# Patient Record
Sex: Female | Born: 1960 | Race: White | Hispanic: No | Marital: Married | Smoking: Never smoker
Health system: Southern US, Community
[De-identification: ages and names within clinical notes are randomized; demographics above are authoritative.]

## PROBLEM LIST (undated history)

## (undated) DIAGNOSIS — T7840XA Allergy, unspecified, initial encounter: Secondary | ICD-10-CM

## (undated) DIAGNOSIS — E785 Hyperlipidemia, unspecified: Secondary | ICD-10-CM

## (undated) DIAGNOSIS — C4491 Basal cell carcinoma of skin, unspecified: Secondary | ICD-10-CM

## (undated) HISTORY — DX: Hyperlipidemia, unspecified: E78.5

## (undated) HISTORY — DX: Allergy, unspecified, initial encounter: T78.40XA

## (undated) HISTORY — DX: Basal cell carcinoma of skin, unspecified: C44.91

## (undated) HISTORY — PX: EYE SURGERY: SHX253

## (undated) HISTORY — PX: REFRACTIVE SURGERY: SHX103

## (undated) HISTORY — PX: CYST REMOVAL NECK: SHX6281

---

## 1999-12-11 HISTORY — PX: SKIN CANCER EXCISION: SHX779

## 2012-01-17 LAB — CBC AND DIFFERENTIAL
HCT: 39 % (ref 36–46)
Hemoglobin: 13.6 g/dL (ref 12.0–16.0)

## 2012-01-17 LAB — HEPATIC FUNCTION PANEL
Alkaline Phosphatase: 96 U/L (ref 25–125)
Bilirubin, Total: 0.5 mg/dL

## 2012-01-17 LAB — BASIC METABOLIC PANEL: Glucose: 101 mg/dL

## 2012-01-21 ENCOUNTER — Ambulatory Visit: Payer: Self-pay | Admitting: Internal Medicine

## 2012-01-23 ENCOUNTER — Ambulatory Visit: Payer: Self-pay | Admitting: Oncology

## 2012-01-24 ENCOUNTER — Ambulatory Visit: Payer: Self-pay | Admitting: Oncology

## 2012-02-08 ENCOUNTER — Ambulatory Visit: Payer: Self-pay | Admitting: Oncology

## 2012-09-15 IMAGING — CR METASTATIC BONE SURVEY
1 series · 8 of 8 positions shown · non-contrast
Comparison: none

REASON FOR EXAM: musculoskeletal system
COMMENTS:

PROCEDURE:     DXR - DXR BONE SURVEY METASTATIC  - January 29, 2012  [DATE]
RESULT:
Skeletal survey shows no lytic or blastic lesions suspicious for metastatic
disease.

[Series 1: w chest pa · 0.14mm/px · 8 of 20 slices shown]
[im 1/20]
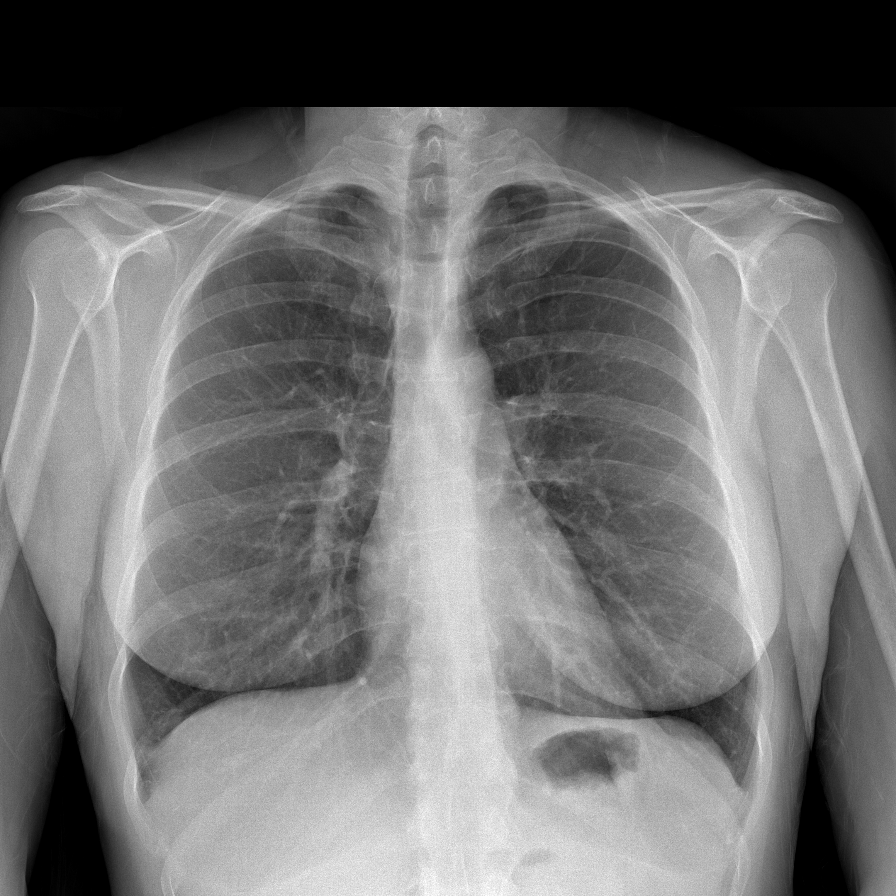
[im 3/20]
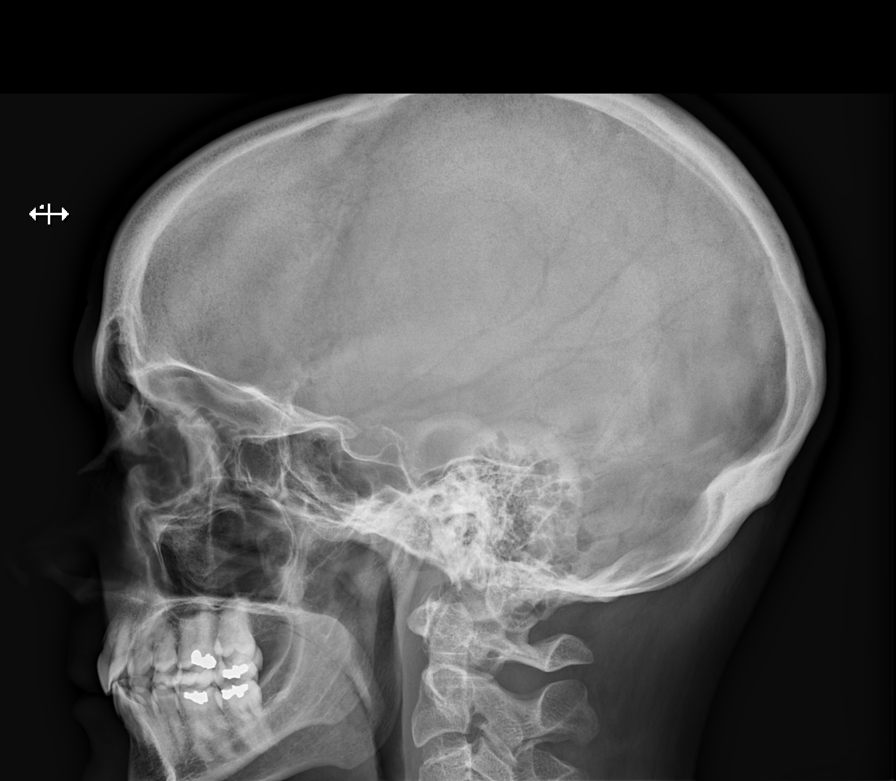
[im 6/20]
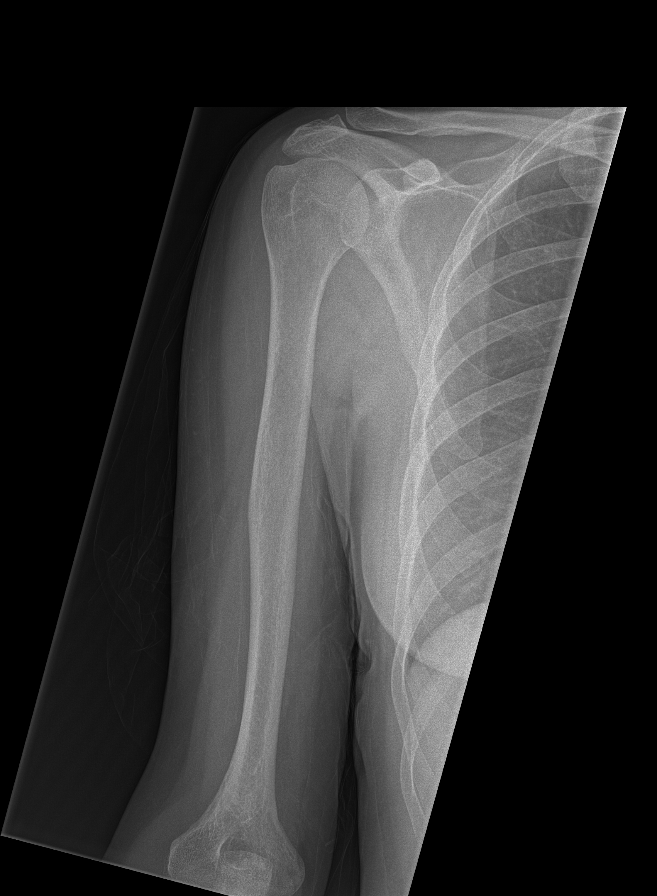
[im 9/20]
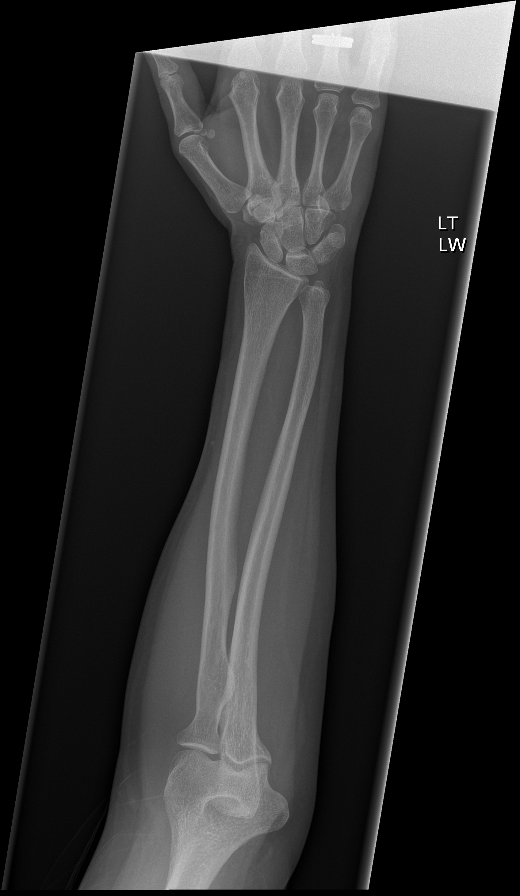
[im 11/20]
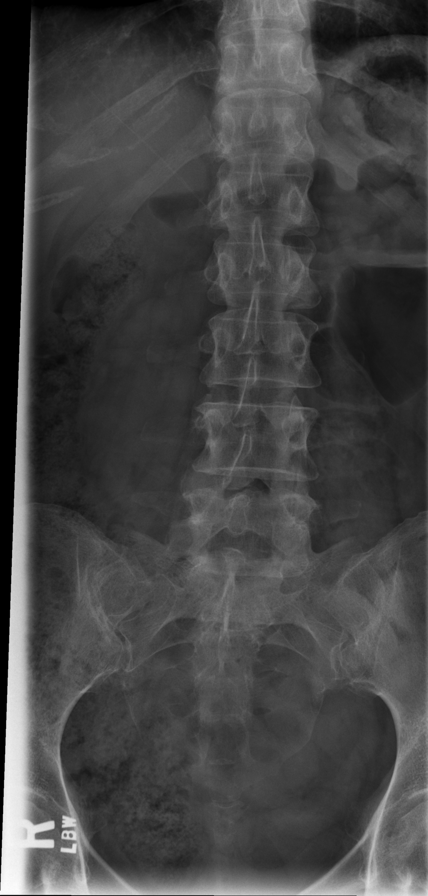
[im 14/20]
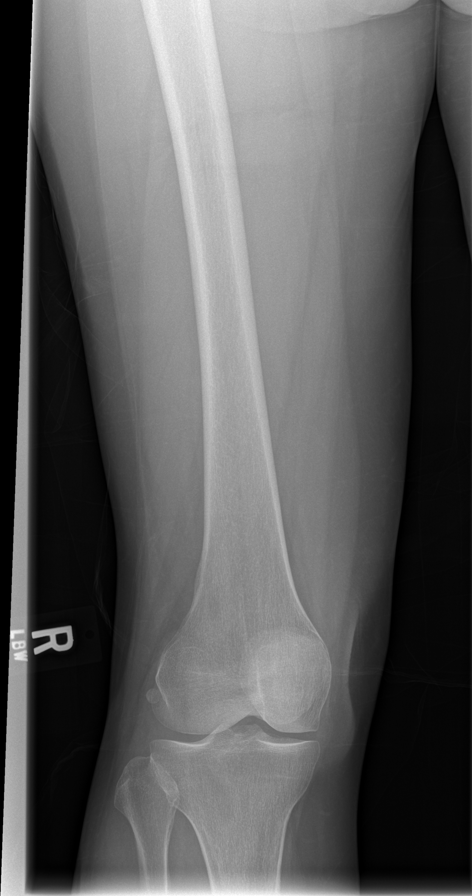
[im 17/20]
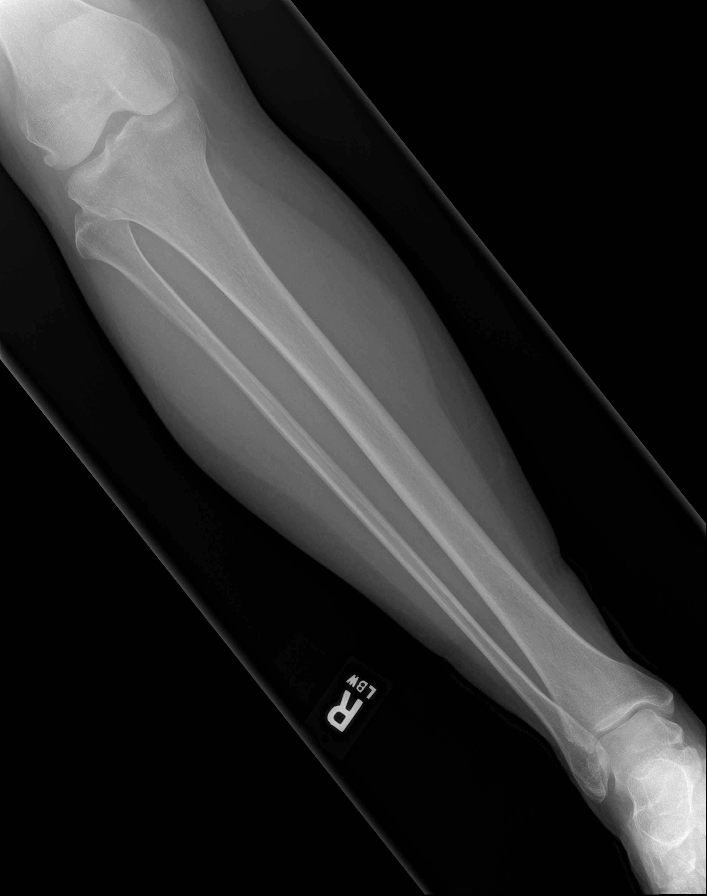
[im 20/20]
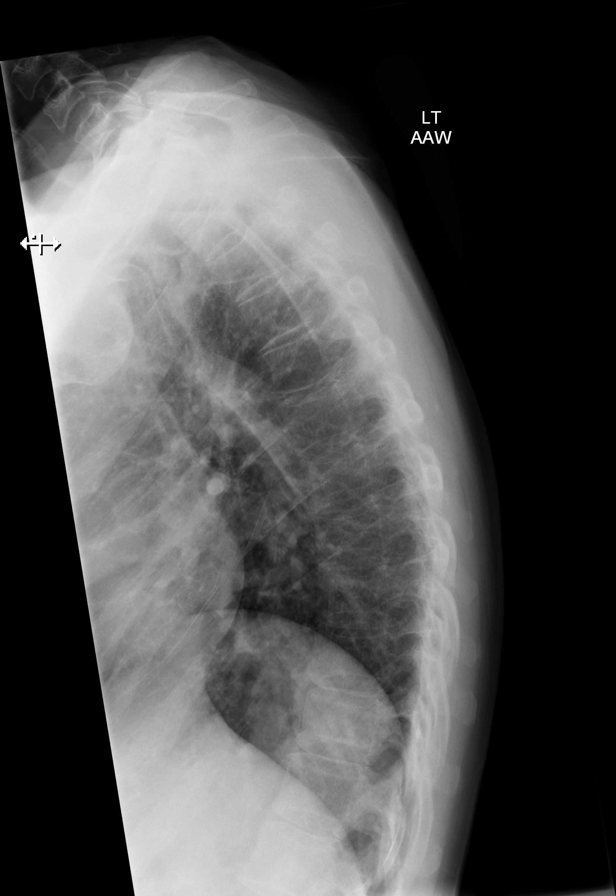

[8 of 8 positions shown; findings below may reference images not displayed]

IMPRESSION: No findings suspicious for metastatic disease are identified.

## 2012-09-29 ENCOUNTER — Ambulatory Visit: Payer: Self-pay | Admitting: Gastroenterology

## 2013-01-01 ENCOUNTER — Encounter: Payer: Self-pay | Admitting: *Deleted

## 2013-01-02 ENCOUNTER — Ambulatory Visit (INDEPENDENT_AMBULATORY_CARE_PROVIDER_SITE_OTHER): Payer: Federal, State, Local not specified - PPO | Admitting: Internal Medicine

## 2013-01-02 ENCOUNTER — Encounter: Payer: Self-pay | Admitting: Internal Medicine

## 2013-01-02 VITALS — BP 140/82 | HR 92 | Temp 98.3°F | Ht 66.0 in | Wt 145.8 lb

## 2013-01-02 DIAGNOSIS — R5381 Other malaise: Secondary | ICD-10-CM

## 2013-01-02 DIAGNOSIS — R5383 Other fatigue: Secondary | ICD-10-CM

## 2013-01-02 DIAGNOSIS — Z800 Family history of malignant neoplasm of digestive organs: Secondary | ICD-10-CM

## 2013-01-02 DIAGNOSIS — N3010 Interstitial cystitis (chronic) without hematuria: Secondary | ICD-10-CM

## 2013-01-02 DIAGNOSIS — C4490 Unspecified malignant neoplasm of skin, unspecified: Secondary | ICD-10-CM

## 2013-01-02 DIAGNOSIS — E7800 Pure hypercholesterolemia, unspecified: Secondary | ICD-10-CM

## 2013-01-02 MED ORDER — ALPRAZOLAM 0.25 MG PO TABS: 0.2500 mg | ORAL_TABLET | Freq: Every day | ORAL | Status: DC | PRN

## 2013-01-04 ENCOUNTER — Encounter: Payer: Self-pay | Admitting: Internal Medicine

## 2013-01-04 DIAGNOSIS — Z800 Family history of malignant neoplasm of digestive organs: Secondary | ICD-10-CM | POA: Insufficient documentation

## 2013-01-04 DIAGNOSIS — C4490 Unspecified malignant neoplasm of skin, unspecified: Secondary | ICD-10-CM | POA: Insufficient documentation

## 2013-01-04 DIAGNOSIS — E7800 Pure hypercholesterolemia, unspecified: Secondary | ICD-10-CM | POA: Insufficient documentation

## 2013-01-04 DIAGNOSIS — N3010 Interstitial cystitis (chronic) without hematuria: Secondary | ICD-10-CM | POA: Insufficient documentation

## 2013-01-04 NOTE — Assessment & Plan Note (Signed)
Low cholesterol diet and exercise.  Check lipid panel.   

## 2013-01-04 NOTE — Assessment & Plan Note (Signed)
Being followed at [facility].  Stable.  Follow.  Takes AZO prn.

## 2013-01-04 NOTE — Assessment & Plan Note (Signed)
Has a history of basal cell carcinoma.  Followed by Dr Dasher.  Sees him twice a year.   

## 2013-01-04 NOTE — Progress Notes (Signed)
  Subjective:    Patient ID: Kelly Tanner, female    DOB: 1961/01/24, 52 y.o.   MRN: [id number]  HPI 52 year old female with past history of hypercholesterolemia and basal cell carcinoma (skin) who comes in today for a scheduled follow up.  She states she is doing relatively well.  Just had her follow up colonoscopy 10/13.  States everything checked out fine.  Has been seen at [facility].  Diagnosed with interstitial cystitis.  Just uses AZO intermittently.  Averages needing 1x every 1-2 months.  Stress seems to trigger.  Handling stress relatively well.  Uses an occasional xanax.  Sees Dr Adolphus Birchwood 2x/year for follow up regarding the basal cell.  Stays active.  Has not been walking as much.  No cardiac symptoms with increased activity or exertion.  Bowels stable.  No vaginal problems.  Due to see Dr Luella Cook next week.   Past Medical History  Diagnosis Date  . Hyperlipidemia   . Basal cell carcinoma     Outpatient Encounter Prescriptions as of 01/02/2013  Medication Sig Dispense Refill  . ALPRAZolam (XANAX) 0.25 MG tablet Take 1 tablet (0.25 mg total) by mouth daily as needed.  30 tablet  0  . [DISCONTINUED] ALPRAZolam (XANAX) 0.25 MG tablet Take 0.25 mg by mouth 2 (two) times daily as needed.        Review of Systems Patient denies any headache, lightheadedness or dizziness.  No sinus or allergy symptoms.  No chest pain, tightness or palpitations.  No increased shortness of breath, cough or congestion.  No nausea or vomiting. No acid reflux.   No abdominal pain or cramping.  No bowel change, such as diarrhea, constipation, BRBPR or melana.  No urine change.   No vaginal problems.      Objective:   Physical Exam  Filed Vitals:   01/02/13 1431  BP: 140/82  Pulse: 92  Temp: 98.3 F (36.8 C)   Blood pressure recheck:  14/54  52 year old female in no acute distress.   HEENT:  Nares- clear.  Oropharynx - without lesions. NECK:  Supple.  Nontender.  No audible bruit.  HEART:  Appears to be  regular. LUNGS:  No crackles or wheezing audible.  Respirations even and unlabored.  RADIAL PULSE:  Equal bilaterally.   ABDOMEN:  Soft, nontender.  Bowel sounds present and normal.  No audible abdominal bruit.    EXTREMITIES:  No increased edema present.  DP pulses palpable and equal bilaterally.           Assessment & Plan:  INCREASED STRESS.  Handling things relatively well.  Uses an occasional xanax.  Follow.    GYN.  Has been followed at [facility].  Was due to see Dr Luella Cook next week.  Wants to get her physicals here.  Will schedule for next visit.    FATIGUE.  Did report some fatigue.  Will check cbc, met c and tsh.   ELEVATED BLOOD PRESSURE WITHOUT DIAGNAOSIS OF HYPERTENSION.  Blood pressures on outside checks averaging 120-130/70s.  Recheck today 130/76.  Follow.  On no medication.   HEALTH MAINTENANCE.  Schedule a physical next visit.  Mammogram 6/13 (per her report - ok).  Obtain results.  Colonoscopy 10/13 - ok.  Obtain results.  Check cholesterol.

## 2013-01-04 NOTE — Assessment & Plan Note (Signed)
Colonoscopy 10/13.  Obtain results.  Follow up 5 years.

## 2013-01-24 ENCOUNTER — Other Ambulatory Visit: Payer: Self-pay

## 2013-01-31 LAB — HM COLONOSCOPY

## 2013-02-10 ENCOUNTER — Other Ambulatory Visit: Payer: Federal, State, Local not specified - PPO

## 2013-02-17 ENCOUNTER — Encounter: Payer: Federal, State, Local not specified - PPO | Admitting: Internal Medicine

## 2013-03-10 ENCOUNTER — Encounter: Payer: Self-pay | Admitting: Internal Medicine

## 2013-03-25 ENCOUNTER — Other Ambulatory Visit (INDEPENDENT_AMBULATORY_CARE_PROVIDER_SITE_OTHER): Payer: Federal, State, Local not specified - PPO

## 2013-03-25 DIAGNOSIS — R5381 Other malaise: Secondary | ICD-10-CM

## 2013-03-25 DIAGNOSIS — E7800 Pure hypercholesterolemia, unspecified: Secondary | ICD-10-CM

## 2013-03-25 DIAGNOSIS — R5383 Other fatigue: Secondary | ICD-10-CM

## 2013-03-25 LAB — COMPREHENSIVE METABOLIC PANEL
ALT: 16 U/L (ref 0–35)
AST: 16 U/L (ref 0–37)
Albumin: 4.3 g/dL (ref 3.5–5.2)
Alkaline Phosphatase: 96 U/L (ref 39–117)
BUN: 10 mg/dL (ref 6–23)
Potassium: 4.6 mEq/L (ref 3.5–5.1)

## 2013-03-25 LAB — CBC WITH DIFFERENTIAL/PLATELET
Basophils Absolute: 0 10*3/uL (ref 0.0–0.1)
Basophils Relative: 0.3 % (ref 0.0–3.0)
Eosinophils Absolute: 0.1 10*3/uL (ref 0.0–0.7)
Lymphocytes Relative: 21.8 % (ref 12.0–46.0)
MCHC: 33.4 g/dL (ref 30.0–36.0)
MCV: 89.2 fl (ref 78.0–100.0)
Monocytes Absolute: 0.4 10*3/uL (ref 0.1–1.0)
Neutrophils Relative %: 71 % (ref 43.0–77.0)
Platelets: 244 10*3/uL (ref 150.0–400.0)
RBC: 3.98 Mil/uL (ref 3.87–5.11)
RDW: 13.5 % (ref 11.5–14.6)

## 2013-03-25 LAB — LIPID PANEL
Cholesterol: 199 mg/dL (ref 0–200)
LDL Cholesterol: 130 mg/dL — ABNORMAL HIGH (ref 0–99)

## 2013-03-26 ENCOUNTER — Telehealth: Payer: Self-pay | Admitting: Internal Medicine

## 2013-03-26 NOTE — Telephone Encounter (Signed)
Can you see if we can add ferritin and ibc panel (dx 285.9) to blood drawn yesterday.  Thanks.  Let me know if unable.

## 2013-03-27 ENCOUNTER — Encounter: Payer: Self-pay | Admitting: Internal Medicine

## 2013-03-30 ENCOUNTER — Encounter: Payer: Self-pay | Admitting: *Deleted

## 2013-03-30 ENCOUNTER — Other Ambulatory Visit (INDEPENDENT_AMBULATORY_CARE_PROVIDER_SITE_OTHER): Payer: Federal, State, Local not specified - PPO

## 2013-03-30 DIAGNOSIS — D649 Anemia, unspecified: Secondary | ICD-10-CM

## 2013-03-31 ENCOUNTER — Telehealth: Payer: Self-pay | Admitting: *Deleted

## 2013-03-31 LAB — IBC PANEL
Iron: 41 ug/dL — ABNORMAL LOW (ref 42–145)
Transferrin: 239.3 mg/dL (ref 212.0–360.0)

## 2013-03-31 NOTE — Telephone Encounter (Signed)
I don't know.  I have lab results on this pt from 03/25/13.  When were these tubes drawn.  She is coming in 04/01/13 for an appt and I was going to follow up on lab check then.

## 2013-03-31 NOTE — Telephone Encounter (Signed)
I would hold until tomorrow.  I will talk to pt tomorrow.  Thanks.

## 2013-03-31 NOTE — Telephone Encounter (Signed)
Her tubes are here and there are no orders or a labels on the tube, what would you like to do with them

## 2013-03-31 NOTE — Telephone Encounter (Signed)
Ok thank you 

## 2013-03-31 NOTE — Telephone Encounter (Signed)
I do not know when they were drawn? I just seen tubes with the the patients name on it but no date?

## 2013-03-31 NOTE — Telephone Encounter (Signed)
Duplicate

## 2013-03-31 NOTE — Telephone Encounter (Signed)
See other message

## 2013-03-31 NOTE — Telephone Encounter (Signed)
i have tubes from this pt, I just don't have labels on them or orders?

## 2013-04-01 ENCOUNTER — Ambulatory Visit (INDEPENDENT_AMBULATORY_CARE_PROVIDER_SITE_OTHER): Payer: Federal, State, Local not specified - PPO | Admitting: Internal Medicine

## 2013-04-01 ENCOUNTER — Encounter: Payer: Self-pay | Admitting: Internal Medicine

## 2013-04-01 VITALS — BP 140/80 | HR 106 | Temp 99.1°F | Ht 66.25 in | Wt 146.0 lb

## 2013-04-01 DIAGNOSIS — C4490 Unspecified malignant neoplasm of skin, unspecified: Secondary | ICD-10-CM

## 2013-04-01 DIAGNOSIS — Z800 Family history of malignant neoplasm of digestive organs: Secondary | ICD-10-CM

## 2013-04-01 DIAGNOSIS — D649 Anemia, unspecified: Secondary | ICD-10-CM

## 2013-04-01 DIAGNOSIS — E7800 Pure hypercholesterolemia, unspecified: Secondary | ICD-10-CM

## 2013-04-01 DIAGNOSIS — Z1239 Encounter for other screening for malignant neoplasm of breast: Secondary | ICD-10-CM

## 2013-04-01 DIAGNOSIS — N3010 Interstitial cystitis (chronic) without hematuria: Secondary | ICD-10-CM

## 2013-04-01 DIAGNOSIS — N390 Urinary tract infection, site not specified: Secondary | ICD-10-CM

## 2013-04-01 LAB — POCT URINALYSIS DIPSTICK
Protein, UA: NEGATIVE
Spec Grav, UA: 1.01
Urobilinogen, UA: 0.2

## 2013-04-01 MED ORDER — ALPRAZOLAM 0.25 MG PO TABS: 0.2500 mg | ORAL_TABLET | Freq: Every day | ORAL | Status: DC | PRN

## 2013-04-01 NOTE — Progress Notes (Signed)
Ok

## 2013-04-02 ENCOUNTER — Encounter: Payer: Self-pay | Admitting: Internal Medicine

## 2013-04-03 LAB — URINE CULTURE: Colony Count: 70000

## 2013-04-03 MED ORDER — AMOXICILLIN 875 MG PO TABS: 875.0000 mg | ORAL_TABLET | Freq: Two times a day (BID) | ORAL | Status: AC

## 2013-04-05 ENCOUNTER — Encounter: Payer: Self-pay | Admitting: Internal Medicine

## 2013-04-05 NOTE — Assessment & Plan Note (Signed)
Colonoscopy 10/13.  Obtain results.  Follow up 5 years.

## 2013-04-05 NOTE — Assessment & Plan Note (Signed)
Has a history of basal cell carcinoma.  Followed by Dr Dasher.  Sees him twice a year.   

## 2013-04-05 NOTE — Assessment & Plan Note (Signed)
Low cholesterol diet and exercise.  Follow lipid panel.   

## 2013-04-05 NOTE — Assessment & Plan Note (Signed)
Being followed at [facility].  Stable.  Follow.  Takes AZO prn.

## 2013-04-05 NOTE — Progress Notes (Signed)
Subjective:    Patient ID: Kelly Tanner, female    DOB: [birth year], 52 y.o.   MRN: [id number]  Urinary Tract Infection  Associated symptoms include frequency.  Urinary Frequency  Associated symptoms include frequency.   52 year old female with past history of hypercholesterolemia and basal cell carcinoma (skin) who comes in today to follow up on these issues as well as for a complete physical exam.  She states she is doing relatively well.  Just had her follow up colonoscopy 10/13.  States everything checked out fine. Has been seen at [facility].  Diagnosed with interstitial cystitis.  Just uses AZO intermittently.  Did notice yesterday am - discomfort with urination.  Some pressure.  Took AZO yesterday.   Handling stress relatively well.  Uses an occasional xanax.  Sees Dr Adolphus Birchwood 2x/year for follow up regarding the basal cell.  Stays active.  Has not been walking as much.  No cardiac symptoms with increased activity or exertion.  Bowels stable.  No vaginal problems.  Did see Dr Luella Cook for her pelvic and pap.    Past Medical History  Diagnosis Date  . Hyperlipidemia   . Basal cell carcinoma     Outpatient Encounter Prescriptions as of 04/01/2013  Medication Sig Dispense Refill  . ALPRAZolam (XANAX) 0.25 MG tablet Take 1 tablet (0.25 mg total) by mouth daily as needed.  30 tablet  0  . diphenhydrAMINE (BENADRYL) 25 MG tablet Take 25 mg by mouth as needed for itching.      . Ibuprofen-Diphenhydramine Cit (ADVIL PM PO) Take by mouth as needed.      . [DISCONTINUED] ALPRAZolam (XANAX) 0.25 MG tablet Take 1 tablet (0.25 mg total) by mouth daily as needed.  30 tablet  0  . [DISCONTINUED] amoxicillin (AMOXIL) 875 MG tablet Take 875 mg by mouth 2 (two) times daily.      Marland Kitchen amoxicillin (AMOXIL) 875 MG tablet Take 1 tablet (875 mg total) by mouth 2 (two) times daily.  14 tablet  0   No facility-administered encounter medications on file as of 04/01/2013.    Review of Systems  Genitourinary:  Positive for frequency.  Patient denies any headache, lightheadedness or dizziness.  No sinus or allergy symptoms.  No chest pain, tightness or palpitations.  No increased shortness of breath, cough or congestion.  No nausea or vomiting. No acid reflux.   No abdominal pain or cramping.  No bowel change, such as diarrhea, constipation, BRBPR or melana.  Urinary symptoms as outlined.  No vaginal problems.      Objective:   Physical Exam  Filed Vitals:   04/01/13 0850  BP: 140/80  Pulse: 106  Temp: 99.1 F (37.3 C)   Blood pressure recheck:  63/73  52 year old female in no acute distress.   HEENT:  Nares- clear.  Oropharynx - without lesions. NECK:  Supple.  Nontender.  No audible bruit.  HEART:  Appears to be regular. LUNGS:  No crackles or wheezing audible.  Respirations even and unlabored.  RADIAL PULSE:  Equal bilaterally.    BREASTS:  No nipple discharge or nipple retraction present.  Could not appreciate any distinct nodules or axillary adenopathy.  ABDOMEN:  Soft, nontender.  Bowel sounds present and normal.  No audible abdominal bruit.  GU:  Performed through gyn.   EXTREMITIES:  No increased edema present.  DP pulses palpable and equal bilaterally.           Assessment & Plan:  INCREASED STRESS.  Handling  things relatively well.  Uses an occasional xanax.  Follow.    GYN.  Pelvic and pap -  Dr Luella Cook.  States everything checked out fine.  Obtain records.   ELEVATED BLOOD PRESSURE WITHOUT DIAGNAOSIS OF HYPERTENSION.  Blood pressure doing better.  Follow.   DYSURIA.  Urinalysis with blood.  Send for culture.  Hold on treatment until cx results return.   HEALTH MAINTENANCE.  Physical today.  Had her pelvic and pap through Dr Luella Cook.  Obtain records.   Mammogram 6/13 (per her report - ok).  Obtain results.  Colonoscopy 09/29/12 - ok.  Obtain results.

## 2013-04-07 ENCOUNTER — Encounter: Payer: Self-pay | Admitting: Internal Medicine

## 2013-04-10 ENCOUNTER — Telehealth: Payer: Self-pay | Admitting: Internal Medicine

## 2013-04-10 NOTE — Telephone Encounter (Signed)
Called pt and discussed culture results.  Questions answered.

## 2013-04-17 ENCOUNTER — Ambulatory Visit (INDEPENDENT_AMBULATORY_CARE_PROVIDER_SITE_OTHER): Payer: Federal, State, Local not specified - PPO | Admitting: Internal Medicine

## 2013-04-17 ENCOUNTER — Telehealth: Payer: Self-pay | Admitting: Internal Medicine

## 2013-04-17 ENCOUNTER — Encounter: Payer: Self-pay | Admitting: Internal Medicine

## 2013-04-17 VITALS — BP 130/80 | HR 125 | Temp 99.2°F | Ht 66.25 in | Wt 147.2 lb

## 2013-04-17 DIAGNOSIS — K146 Glossodynia: Secondary | ICD-10-CM

## 2013-04-17 NOTE — Telephone Encounter (Signed)
Pt coming in today at 3:15 to be seen

## 2013-04-17 NOTE — Progress Notes (Signed)
  Subjective:    Patient ID: Kelly Tanner, female    DOB: 1961/11/24, 52 y.o.   MRN: [id number]  HPI 52 year old female with past history of hypercholesterolemia and basal cell carcinoma (skin) who comes in today as a work in with concerns regarding a burning sensation in her tongue.  She states it started several days ago.  Describes burning on the side of her tongue.  Eating does not bother her.  Better in the morning and worse as the day progresses.  She just completed a course of abx.  Taking a multivitamin with iron and vitamin E.  No fever.  No sore throat.  No sinus congestion or drainage.     Past Medical History  Diagnosis Date  . Hyperlipidemia   . Basal cell carcinoma     Outpatient Encounter Prescriptions as of 04/17/2013  Medication Sig Dispense Refill  . ALPRAZolam (XANAX) 0.25 MG tablet Take 1 tablet (0.25 mg total) by mouth daily as needed.  30 tablet  0  . beta carotene w/minerals (OCUVITE) tablet Take 1 tablet by mouth daily.      . diphenhydrAMINE (BENADRYL) 25 MG tablet Take 25 mg by mouth as needed for itching.      . Ibuprofen-Diphenhydramine Cit (ADVIL PM PO) Take by mouth as needed.      . vitamin E 200 UNIT capsule Take 400 Units by mouth daily.       No facility-administered encounter medications on file as of 04/17/2013.    Review of Systems Patient denies any headache, lightheadedness or dizziness.  No sinus or allergy symptoms.   No increased shortness of breath, cough or congestion.  No lip or tongue swelling.  No nausea or vomiting. No acid reflux.        Objective:   Physical Exam  Filed Vitals:   04/17/13 1508  BP: 130/80  Pulse: 125  Temp: 99.2 F (37.3 C)   Pulse improved on recheck 52 year old female in no acute distress.   HEENT:  Nares- clear.  Oropharynx - minimal erythema - outer tongue.  No white plaques.  NECK:  Supple.  Nontender.   HEART:  Appears to be regular. LUNGS:  No crackles or wheezing audible.  Respirations even and  unlabored.          Assessment & Plan:  BURNING TONGUE.  Question if related to recent abx usage.  Will have her try Dukes Magic Mouthwash swish and spit tid.  Call me if persistent symptoms.    INCREASED STRESS.  Handling things relatively well.  Uses an occasional xanax.  Follow.     HEALTH MAINTENANCE.  Scheduled a physical next visit.  Mammogram 6/13 (per her report - ok).  Obtain results.  Colonoscopy 10/13 - ok.

## 2013-04-17 NOTE — Telephone Encounter (Signed)
See if she can come in at 3:15 for this problem.  Can evaluate and see what is needed.

## 2013-04-17 NOTE — Telephone Encounter (Signed)
Patient Information:  Caller Name: Lateia  Phone: [phone]  Patient: Kelly Tanner, Kelly Tanner  Gender: Female  DOB: 19-Dec-1960  Age: 52 Years  PCP: Dale McCrory  Pregnant: No  Office Follow Up:  Does the office need to follow up with this patient?: Yes  Instructions For The Office: Please contact patient for mouth burning. Reviewed Health information on Mouth, Amoxicillen and FE. Unsure of cause  RN Note:  Reviewed information side effects on Amoxicillen and Fe products.  Please contact patient for s/sx of "Tongue burning".  Symptoms  Reason For Call & Symptoms: Patient states she is having symptoms-this week.  Her tongue feels like its on fire.  She started a multivitamin Fe and vitamin E since 04/02/13 . She was on Antibotic amoxicillen and finished on 04/10/13.  Tongue is not swollen, not painful to eat. No sores or lesions. mild redness. Denies ulcers or sores. No edema. voice is clear and no problems breathing.  She is also having tingling in hands a feet on./off due to her Anxiety.  Reviewed Health History In EMR: Yes  Reviewed Medications In EMR: Yes  Reviewed Allergies In EMR: Yes  Reviewed Surgeries / Procedures: Yes  Date of Onset of Symptoms: 04/13/2013 OB / GYN:  LMP: Unknown  Guideline(s) Used:  No Protocol Available - Sick Adult  Disposition Per Guideline:   Discuss with PCP and Callback by Nurse Today  Reason For Disposition Reached:   Nursing judgment  Advice Given:  Call Back If:  New symptoms develop  You become worse.  RN Overrode Recommendation:  Follow Up With Office Later  Please contact patient for mouth burning. Reviewed Health information on Mouth, Amoxicillen and FE. Unsure of cause

## 2013-04-22 ENCOUNTER — Ambulatory Visit (INDEPENDENT_AMBULATORY_CARE_PROVIDER_SITE_OTHER): Payer: Federal, State, Local not specified - PPO | Admitting: Internal Medicine

## 2013-04-22 ENCOUNTER — Encounter: Payer: Self-pay | Admitting: Internal Medicine

## 2013-04-22 ENCOUNTER — Telehealth: Payer: Self-pay | Admitting: Internal Medicine

## 2013-04-22 VITALS — BP 126/84 | HR 114 | Temp 99.5°F | Wt 146.0 lb

## 2013-04-22 DIAGNOSIS — N3010 Interstitial cystitis (chronic) without hematuria: Secondary | ICD-10-CM

## 2013-04-22 DIAGNOSIS — R350 Frequency of micturition: Secondary | ICD-10-CM

## 2013-04-22 DIAGNOSIS — R319 Hematuria, unspecified: Secondary | ICD-10-CM

## 2013-04-22 LAB — POCT URINALYSIS DIPSTICK
Glucose, UA: 100
Nitrite, UA: POSITIVE
Urobilinogen, UA: 0.2

## 2013-04-22 MED ORDER — CIPROFLOXACIN HCL 500 MG PO TABS: 500.0000 mg | ORAL_TABLET | Freq: Two times a day (BID) | ORAL | Status: DC

## 2013-04-22 NOTE — Telephone Encounter (Signed)
Pt called wanting to see if she could get in today and she stated she has lots of blood in her urine, pain and pressure  Pt did take azo

## 2013-04-22 NOTE — Telephone Encounter (Signed)
Left detailed message on cell number asking pt to come in at 12:15 for 12:30 appointment.

## 2013-04-22 NOTE — Telephone Encounter (Signed)
Pt aware of appointment 

## 2013-04-22 NOTE — Telephone Encounter (Signed)
See if she can come in at 12:30 for work in for this problem.

## 2013-04-23 ENCOUNTER — Encounter: Payer: Self-pay | Admitting: Internal Medicine

## 2013-04-23 DIAGNOSIS — R319 Hematuria, unspecified: Secondary | ICD-10-CM | POA: Insufficient documentation

## 2013-04-23 NOTE — Assessment & Plan Note (Signed)
Evaluated at [facility].  Had cystoscopy and CT.  Obtain results.  Prefers to see Dr Achilles Dunk.  Will arrange appt.

## 2013-04-23 NOTE — Assessment & Plan Note (Signed)
Pt describes the passing of blood and a blood clot.  Exam as outlined.  Urine dip - c/w a possible uti.  Will treat with cipro bid as directed.  After discussion and given presentation, will refer her back to urology for evaluation.  She prefers to see Dr Achilles Dunk.  Stay hydrated.

## 2013-04-23 NOTE — Progress Notes (Signed)
Subjective:    Patient ID: Kelly Tanner, female    DOB: November 17, 1961, 52 y.o.   MRN: [id number]  Urinary Tract Infection   52 year old female with past history of hypercholesterolemia and basal cell carcinoma (skin) who comes in today as a work in with concerns regarding hematuria. Recently was treated for a urinary tract infection.  She had reported that her bladder felt the "best it has felt" in a long time - after completing the abx.  She stopped her diet drinks.  Has been staying hydrated.  Eating better.  Noticed some urinary urgency and burning this am.  When she wiped she noticed some  Pink on the tissue.  She took AZO.  The discomfort increased.  States it is a knife like sensation.  She then went to the bathroom and "blood came out".   No rectal or vaginal bleeding.  She passed a small clot.  No evidence of stone.  States she has done this one other time - which prompted a referral to urology.  She was evaluated and worked up by [facility].  Had negative CT and cystoscopy.  Was told she had interstitial cystitis.  Intermittently will have discomfort and flares.     Past Medical History  Diagnosis Date  . Hyperlipidemia   . Basal cell carcinoma     Outpatient Encounter Prescriptions as of 04/22/2013  Medication Sig Dispense Refill  . ALPRAZolam (XANAX) 0.25 MG tablet Take 1 tablet (0.25 mg total) by mouth daily as needed.  30 tablet  0  . beta carotene w/minerals (OCUVITE) tablet Take 1 tablet by mouth daily.      . diphenhydrAMINE (BENADRYL) 25 MG tablet Take 25 mg by mouth as needed for itching.      . Ibuprofen-Diphenhydramine Cit (ADVIL PM PO) Take by mouth as needed.      . ciprofloxacin (CIPRO) 500 MG tablet Take 1 tablet (500 mg total) by mouth 2 (two) times daily.  14 tablet  0  . vitamin E 200 UNIT capsule Take 400 Units by mouth daily.       No facility-administered encounter medications on file as of 04/22/2013.    Review of Systems Patient denies any fever.  The  previous "burning tongue" has improved.  No nausea or vomiting. No significant abdominal pain.  Reports the pressure sensation.  No back pain now.  Previously had sharp back pain.  Lasted for  Short period and then resolved.  No vaginal or rectal bleeding. Urinary symptoms as outlined.  Previously diagnosed with interstitial cystitis.  Had lots of questions regarding IC and the hematuria.  Pts husband accompanies her today.       Objective:   Physical Exam  Filed Vitals:   04/22/13 1222  BP: 126/84  Pulse: 114  Temp: 99.5 F (37.5 C)   Pulse recheck:  71  52 year old female in no acute distress.   HEART:  Appears to be regular. LUNGS:  No crackles or wheezing audible.  Respirations even and unlabored.  RADIAL PULSE:  Equal bilaterally.   ABDOMEN:  Soft, nontender.  Bowel sounds present and normal.  No audible abdominal bruit.  BACK:  No CVA tenderness. Non tender.            Assessment & Plan:  BURNING TONGUE. Improved.      INCREASED STRESS.  Handling things relatively well.  Uses an occasional xanax.  Follow.     HEALTH MAINTENANCE.  Scheduled for a physical next  visit.  Mammogram 6/13 (per her report - ok).  Obtain results.  Colonoscopy 10/13 - ok.   I spent over 25 minutes with the patient and her husband and more than 50% of the time was spent in consultation regarding the above.

## 2013-04-24 LAB — URINE CULTURE: Colony Count: 9000

## 2013-04-26 ENCOUNTER — Encounter: Payer: Self-pay | Admitting: Internal Medicine

## 2013-05-05 ENCOUNTER — Other Ambulatory Visit: Payer: Federal, State, Local not specified - PPO

## 2013-05-18 ENCOUNTER — Encounter: Payer: Self-pay | Admitting: Internal Medicine

## 2013-05-18 ENCOUNTER — Other Ambulatory Visit (INDEPENDENT_AMBULATORY_CARE_PROVIDER_SITE_OTHER): Payer: Federal, State, Local not specified - PPO

## 2013-05-18 DIAGNOSIS — D649 Anemia, unspecified: Secondary | ICD-10-CM

## 2013-05-18 LAB — CBC WITH DIFFERENTIAL/PLATELET
Basophils Absolute: 0 10*3/uL (ref 0.0–0.1)
Eosinophils Relative: 1.7 % (ref 0.0–5.0)
Hemoglobin: 12.5 g/dL (ref 12.0–15.0)
Lymphocytes Relative: 22 % (ref 12.0–46.0)
Monocytes Relative: 4.7 % (ref 3.0–12.0)
Neutro Abs: 4.7 10*3/uL (ref 1.4–7.7)
RDW: 13.9 % (ref 11.5–14.6)
WBC: 6.5 10*3/uL (ref 4.5–10.5)

## 2013-05-18 LAB — FERRITIN: Ferritin: 53.3 ng/mL (ref 10.0–291.0)

## 2013-05-19 ENCOUNTER — Encounter: Payer: Self-pay | Admitting: Internal Medicine

## 2013-05-25 ENCOUNTER — Other Ambulatory Visit: Payer: Federal, State, Local not specified - PPO

## 2013-05-25 DIAGNOSIS — R3129 Other microscopic hematuria: Secondary | ICD-10-CM | POA: Insufficient documentation

## 2013-05-27 ENCOUNTER — Other Ambulatory Visit: Payer: Federal, State, Local not specified - PPO

## 2013-06-02 ENCOUNTER — Encounter: Payer: Self-pay | Admitting: *Deleted

## 2013-06-06 ENCOUNTER — Encounter: Payer: Self-pay | Admitting: Internal Medicine

## 2013-06-25 ENCOUNTER — Encounter: Payer: Self-pay | Admitting: Internal Medicine

## 2013-10-06 ENCOUNTER — Encounter: Payer: Self-pay | Admitting: Internal Medicine

## 2013-10-06 ENCOUNTER — Ambulatory Visit (INDEPENDENT_AMBULATORY_CARE_PROVIDER_SITE_OTHER): Payer: Federal, State, Local not specified - PPO | Admitting: Internal Medicine

## 2013-10-06 VITALS — BP 142/82 | HR 86 | Temp 98.6°F | Ht 66.25 in | Wt 151.0 lb

## 2013-10-06 DIAGNOSIS — E7800 Pure hypercholesterolemia, unspecified: Secondary | ICD-10-CM

## 2013-10-06 DIAGNOSIS — Z23 Encounter for immunization: Secondary | ICD-10-CM

## 2013-10-06 DIAGNOSIS — R319 Hematuria, unspecified: Secondary | ICD-10-CM

## 2013-10-06 DIAGNOSIS — N3010 Interstitial cystitis (chronic) without hematuria: Secondary | ICD-10-CM

## 2013-10-06 DIAGNOSIS — Z800 Family history of malignant neoplasm of digestive organs: Secondary | ICD-10-CM

## 2013-10-06 DIAGNOSIS — C4490 Unspecified malignant neoplasm of skin, unspecified: Secondary | ICD-10-CM

## 2013-10-06 DIAGNOSIS — D649 Anemia, unspecified: Secondary | ICD-10-CM

## 2013-10-06 MED ORDER — ALPRAZOLAM 0.25 MG PO TABS: 0.2500 mg | ORAL_TABLET | Freq: Every day | ORAL | Status: DC | PRN

## 2013-10-11 ENCOUNTER — Encounter: Payer: Self-pay | Admitting: Internal Medicine

## 2013-10-11 DIAGNOSIS — D649 Anemia, unspecified: Secondary | ICD-10-CM | POA: Insufficient documentation

## 2013-10-11 NOTE — Assessment & Plan Note (Signed)
Colonoscopy 10/13.    Follow up 5 years.   

## 2013-10-11 NOTE — Assessment & Plan Note (Signed)
Evaluated at [facility].  Had cystoscopy and CT.  Recently evaluated by Dr Cope.  Is being followed by Dr Cope now.  Cystoscopy ok.  Takes nitrofurantoin prn.  Doing well. States does not have interstitial cystitis.    

## 2013-10-11 NOTE — Assessment & Plan Note (Signed)
Last check normal.  Follow cbc/ferritin.  Up to date with colonoscopy.   

## 2013-10-11 NOTE — Assessment & Plan Note (Signed)
Has a history of basal cell carcinoma.  Followed by Dr Dasher.  Sees him twice a year.   

## 2013-10-11 NOTE — Progress Notes (Signed)
  Subjective:    Patient ID: Kelly Tanner, female    DOB: 01-25-1961, 52 y.o.   MRN: [id number]  HPI 52 year old female with past history of hypercholesterolemia and basal cell carcinoma (skin) who comes in today for a scheduled follow up.  States she is doing well.  No headache or dizziness.  No sinus issues.  Saw Dr Achilles Dunk.  Had cystoscopy.  Everything checked out fine.  Takes nitrofurantion prn.  Using estrace cream.  Feels better.  No blood.  Some issues with sleeping.  Discussed treatment options.  Will try melatonin.  Eating and drinking well.  Stays active.  Bowels stable.     Past Medical History  Diagnosis Date  . Hyperlipidemia   . Basal cell carcinoma     Outpatient Encounter Prescriptions as of 10/06/2013  Medication Sig  . ALPRAZolam (XANAX) 0.25 MG tablet Take 1 tablet (0.25 mg total) by mouth daily as needed.  . diphenhydrAMINE (BENADRYL) 25 MG tablet Take 25 mg by mouth as needed for itching.  . estradiol (ESTRACE) 0.1 MG/GM vaginal cream Place 2 g vaginally daily.  . Ibuprofen-Diphenhydramine Cit (ADVIL PM PO) Take by mouth as needed.  . Multiple Vitamin (MULTIVITAMIN) tablet Take 1 tablet by mouth daily.  . [DISCONTINUED] ALPRAZolam (XANAX) 0.25 MG tablet Take 1 tablet (0.25 mg total) by mouth daily as needed.  . nitrofurantoin, macrocrystal-monohydrate, (MACROBID) 100 MG capsule Take 100 mg by mouth 2 (two) times daily as needed.  . [DISCONTINUED] beta carotene w/minerals (OCUVITE) tablet Take 1 tablet by mouth daily.  . [DISCONTINUED] ciprofloxacin (CIPRO) 500 MG tablet Take 1 tablet (500 mg total) by mouth 2 (two) times daily.  . [DISCONTINUED] vitamin E 200 UNIT capsule Take 400 Units by mouth daily.    Review of Systems Patient denies any headache, lightheadedness or dizziness.  No sinus or allergy symptoms.   No increased shortness of breath, cough or congestion.  No chest pain or tightness.  No palpitations.   No nausea or vomiting. No acid reflux.  No abdominal  pain or cramping.  No bowel change.  Overall she feels she is doing well.  Some sleep issues as outlined.       Objective:   Physical Exam  Filed Vitals:   10/06/13 0858  BP: 142/82  Pulse: 86  Temp: 98.6 F (75 C)   52 year old female in no acute distress.   HEENT:  Nares- clear.  Oropharynx - without lesions. NECK:  Supple.  Nontender.  No audible bruit.  HEART:  Appears to be regular. LUNGS:  No crackles or wheezing audible.  Respirations even and unlabored.  RADIAL PULSE:  Equal bilaterally.     ABDOMEN:  Soft, nontender.  Bowel sounds present and normal.  No audible abdominal bruit.    EXTREMITIES:  No increased edema present.  DP pulses palpable and equal bilaterally.          Assessment & Plan:  INCREASED STRESS.  Handling things relatively well.  Uses an occasional xanax.  Will try melatonin for sleep.  Follow.   HEALTH MAINTENANCE.  Schedule a physical next visit.  Last check with gyn 1/14.  Mammogram 06/02/13 - Birads I.   Colonoscopy 10/13 - ok.

## 2013-10-11 NOTE — Assessment & Plan Note (Signed)
No reoccurrence.  Worked up by Dr Cope as outlined.  Follow.   

## 2013-10-11 NOTE — Assessment & Plan Note (Signed)
Low cholesterol diet and exercise.  Follow lipid panel.   

## 2013-10-16 ENCOUNTER — Other Ambulatory Visit (INDEPENDENT_AMBULATORY_CARE_PROVIDER_SITE_OTHER): Payer: Federal, State, Local not specified - PPO

## 2013-10-16 DIAGNOSIS — D649 Anemia, unspecified: Secondary | ICD-10-CM

## 2013-10-16 DIAGNOSIS — E7800 Pure hypercholesterolemia, unspecified: Secondary | ICD-10-CM

## 2013-10-16 LAB — CBC WITH DIFFERENTIAL/PLATELET
Basophils Relative: 0.3 % (ref 0.0–3.0)
Eosinophils Absolute: 0.1 10*3/uL (ref 0.0–0.7)
Hemoglobin: 12.6 g/dL (ref 12.0–15.0)
Lymphocytes Relative: 24.8 % (ref 12.0–46.0)
MCV: 88 fl (ref 78.0–100.0)
Monocytes Relative: 5.2 % (ref 3.0–12.0)
Neutro Abs: 4.7 10*3/uL (ref 1.4–7.7)
Neutrophils Relative %: 67.9 % (ref 43.0–77.0)
Platelets: 242 10*3/uL (ref 150.0–400.0)
RBC: 4.2 Mil/uL (ref 3.87–5.11)
RDW: 13.6 % (ref 11.5–14.6)
WBC: 7 10*3/uL (ref 4.5–10.5)

## 2013-10-16 LAB — LIPID PANEL
Cholesterol: 194 mg/dL (ref 0–200)
HDL: 37.4 mg/dL — ABNORMAL LOW (ref >39.00)
LDL Cholesterol: 127 mg/dL — ABNORMAL HIGH (ref 0–99)
Total CHOL/HDL Ratio: 5
Triglycerides: 148 mg/dL (ref 0.0–149.0)

## 2013-10-16 LAB — FERRITIN: Ferritin: 60.5 ng/mL (ref 10.0–291.0)

## 2013-10-17 ENCOUNTER — Encounter: Payer: Self-pay | Admitting: Internal Medicine

## 2013-10-20 ENCOUNTER — Other Ambulatory Visit: Payer: Federal, State, Local not specified - PPO

## 2014-03-15 ENCOUNTER — Encounter: Payer: Self-pay | Admitting: Internal Medicine

## 2014-03-15 DIAGNOSIS — R319 Hematuria, unspecified: Secondary | ICD-10-CM

## 2014-03-15 DIAGNOSIS — E7800 Pure hypercholesterolemia, unspecified: Secondary | ICD-10-CM

## 2014-03-15 NOTE — Telephone Encounter (Signed)
Order placed for labs.   Needs a fasting lab appt scheduled 1-2 days before her appt.  Thanks.  See her message.

## 2014-03-31 ENCOUNTER — Other Ambulatory Visit (INDEPENDENT_AMBULATORY_CARE_PROVIDER_SITE_OTHER): Payer: Federal, State, Local not specified - PPO

## 2014-03-31 ENCOUNTER — Encounter: Payer: Self-pay | Admitting: Internal Medicine

## 2014-03-31 DIAGNOSIS — R319 Hematuria, unspecified: Secondary | ICD-10-CM

## 2014-03-31 DIAGNOSIS — E7800 Pure hypercholesterolemia, unspecified: Secondary | ICD-10-CM

## 2014-03-31 LAB — COMPREHENSIVE METABOLIC PANEL
ALT: 17 U/L (ref 0–35)
AST: 17 U/L (ref 0–37)
Albumin: 4.1 g/dL (ref 3.5–5.2)
Alkaline Phosphatase: 96 U/L (ref 39–117)
BUN: 10 mg/dL (ref 6–23)
CO2: 26 mEq/L (ref 19–32)
Calcium: 9.7 mg/dL (ref 8.4–10.5)
Chloride: 104 mEq/L (ref 96–112)
Creatinine, Ser: 0.7 mg/dL (ref 0.4–1.2)
GFR: 97.79 mL/min (ref >60.00)
Glucose, Bld: 94 mg/dL (ref 70–99)
Potassium: 4.5 mEq/L (ref 3.5–5.1)
Sodium: 139 mEq/L (ref 135–145)
Total Bilirubin: 0.6 mg/dL (ref 0.3–1.2)
Total Protein: 8 g/dL (ref 6.0–8.3)

## 2014-03-31 LAB — LIPID PANEL
CHOLESTEROL: 191 mg/dL (ref 0–200)
HDL: 36.4 mg/dL — ABNORMAL LOW (ref >39.00)
LDL Cholesterol: 121 mg/dL — ABNORMAL HIGH (ref 0–99)
Total CHOL/HDL Ratio: 5
Triglycerides: 169 mg/dL — ABNORMAL HIGH (ref 0.0–149.0)
VLDL: 33.8 mg/dL (ref 0.0–40.0)

## 2014-03-31 LAB — TSH: TSH: 1.75 u[IU]/mL (ref 0.35–5.50)

## 2014-04-05 ENCOUNTER — Encounter: Payer: Self-pay | Admitting: Internal Medicine

## 2014-04-05 ENCOUNTER — Ambulatory Visit (INDEPENDENT_AMBULATORY_CARE_PROVIDER_SITE_OTHER): Payer: Federal, State, Local not specified - PPO | Admitting: Internal Medicine

## 2014-04-05 VITALS — BP 122/70 | HR 93 | Temp 98.9°F | Ht 66.25 in | Wt 148.8 lb

## 2014-04-05 DIAGNOSIS — N3010 Interstitial cystitis (chronic) without hematuria: Secondary | ICD-10-CM

## 2014-04-05 DIAGNOSIS — F439 Reaction to severe stress, unspecified: Secondary | ICD-10-CM

## 2014-04-05 DIAGNOSIS — Z1239 Encounter for other screening for malignant neoplasm of breast: Secondary | ICD-10-CM

## 2014-04-05 DIAGNOSIS — E7800 Pure hypercholesterolemia, unspecified: Secondary | ICD-10-CM

## 2014-04-05 DIAGNOSIS — R319 Hematuria, unspecified: Secondary | ICD-10-CM

## 2014-04-05 DIAGNOSIS — Z733 Stress, not elsewhere classified: Secondary | ICD-10-CM

## 2014-04-05 DIAGNOSIS — C4490 Unspecified malignant neoplasm of skin, unspecified: Secondary | ICD-10-CM

## 2014-04-05 DIAGNOSIS — Z800 Family history of malignant neoplasm of digestive organs: Secondary | ICD-10-CM

## 2014-04-05 DIAGNOSIS — D649 Anemia, unspecified: Secondary | ICD-10-CM

## 2014-04-05 MED ORDER — ALPRAZOLAM 0.25 MG PO TABS: 0.2500 mg | ORAL_TABLET | Freq: Every day | ORAL | Status: DC | PRN

## 2014-04-05 MED ORDER — SERTRALINE HCL 50 MG PO TABS: 50.0000 mg | ORAL_TABLET | Freq: Every day | ORAL | Status: DC

## 2014-04-05 NOTE — Patient Instructions (Signed)
Start Zoloft 1/2 tablet per day for one week and then one whole tablet per day

## 2014-04-05 NOTE — Progress Notes (Signed)
  Subjective:    Patient ID: Kelly Tanner, female    DOB: 08-16-1961, 53 y.o.   MRN: [id number]  HPI 53 year old female with past history of hypercholesterolemia and basal cell carcinoma (skin) who comes in today to follow up on these issues as well as for a complete physical exam.  States she is doing well.  No headache or dizziness.  No sinus issues.  Saw Dr Jacqlyn Larsen.  Had cystoscopy.  Everything checked out fine.  Takes nitrofurantion prn.  Using estrace cream.  Feels better.  No blood.  Eating and drinking well.  Stays active.  Bowels stable.  Increased stress and some anxiety related to this.  Discussed treatment options.  She was agreeable to start zoloft.  Feels she needs something to help level her out.      Past Medical History  Diagnosis Date  . Hyperlipidemia   . Basal cell carcinoma     Outpatient Encounter Prescriptions as of 04/05/2014  Medication Sig  . ALPRAZolam (XANAX) 0.25 MG tablet Take 1 tablet (0.25 mg total) by mouth daily as needed.  . diphenhydrAMINE (BENADRYL) 25 MG tablet Take 25 mg by mouth as needed for itching.  . estradiol (ESTRACE) 0.1 MG/GM vaginal cream Place 2 g vaginally daily.  . Multiple Vitamin (MULTIVITAMIN) tablet Take 1 tablet by mouth daily.  . Ibuprofen-Diphenhydramine Cit (ADVIL PM PO) Take by mouth as needed.  . nitrofurantoin, macrocrystal-monohydrate, (MACROBID) 100 MG capsule Take 100 mg by mouth 2 (two) times daily as needed.    Review of Systems Patient denies any headache, lightheadedness or dizziness.  No sinus or allergy symptoms.   No increased shortness of breath, cough or congestion.  No chest pain or tightness.  No palpitations.   No nausea or vomiting. No acid reflux.  No abdominal pain or cramping.  No bowel change.  Overall she feels she is doing well.  Some increased stress as outlined.       Objective:   Physical Exam  Filed Vitals:   04/05/14 1034  BP: 122/70  Pulse: 93  Temp: 98.9 F (72.4 C)   53 year old female in no  acute distress.   HEENT:  Nares- clear.  Oropharynx - without lesions. NECK:  Supple.  Nontender.  No audible bruit.  HEART:  Appears to be regular. LUNGS:  No crackles or wheezing audible.  Respirations even and unlabored.  RADIAL PULSE:  Equal bilaterally.    BREASTS:  No nipple discharge or nipple retraction present.  Could not appreciate any distinct nodules or axillary adenopathy.  ABDOMEN:  Soft, nontender.  Bowel sounds present and normal.  No audible abdominal bruit.  GU:  Not performed.   EXTREMITIES:  No increased edema present.  DP pulses palpable and equal bilaterally.          Assessment & Plan:  HEALTH MAINTENANCE.  Physical today.   Sees gyn.  Last pap 01/31/13 negative with negative HPV.   Mammogram 06/02/13 - Birads I.   Colonoscopy 10/13 - ok.   I spent 25 minutes with the patient and more than 50% of the time was spent in consultation regarding the above.

## 2014-04-05 NOTE — Progress Notes (Signed)
Pre visit review using our clinic review tool, if applicable. No additional management support is needed unless otherwise documented below in the visit note. 

## 2014-04-06 ENCOUNTER — Encounter: Payer: Self-pay | Admitting: Internal Medicine

## 2014-04-06 DIAGNOSIS — F430 Acute stress reaction: Secondary | ICD-10-CM | POA: Insufficient documentation

## 2014-04-06 DIAGNOSIS — F439 Reaction to severe stress, unspecified: Secondary | ICD-10-CM | POA: Insufficient documentation

## 2014-04-06 NOTE — Assessment & Plan Note (Signed)
Increased stress as outlined.  Discussed at length with her today.  Discussed treatment options.  Will start zoloft as directed.  Follow closely.  Get her back in soon to reassess response.

## 2014-04-06 NOTE — Assessment & Plan Note (Signed)
Low cholesterol diet and exercise.  Follow lipid panel.  Lipid pane just checked 03/31/14 revealed total cholesterol 191, triglycerides 169, HDL 36 and LDL 121.

## 2014-04-06 NOTE — Assessment & Plan Note (Signed)
Colonoscopy 10/13.    Follow up 5 years.   

## 2014-04-06 NOTE — Assessment & Plan Note (Signed)
Evaluated at [facility].  Had cystoscopy and CT.  Recently evaluated by Dr Cope.  Is being followed by Dr Cope now.  Cystoscopy ok.  Takes nitrofurantoin prn.  Doing well. States does not have interstitial cystitis.    

## 2014-04-06 NOTE — Assessment & Plan Note (Signed)
No reoccurrence.  Worked up by Dr Jacqlyn Larsen as outlined.  Follow.

## 2014-04-06 NOTE — Assessment & Plan Note (Signed)
Last check normal.  Follow cbc/ferritin.  Up to date with colonoscopy.   

## 2014-04-06 NOTE — Assessment & Plan Note (Signed)
Has a history of basal cell carcinoma.  Followed by Dr Evorn Gong.  Sees him twice a year.

## 2014-04-15 ENCOUNTER — Encounter: Payer: Self-pay | Admitting: *Deleted

## 2014-05-17 ENCOUNTER — Encounter: Payer: Self-pay | Admitting: Internal Medicine

## 2014-05-17 ENCOUNTER — Ambulatory Visit (INDEPENDENT_AMBULATORY_CARE_PROVIDER_SITE_OTHER): Payer: Federal, State, Local not specified - PPO | Admitting: Internal Medicine

## 2014-05-17 VITALS — BP 130/78 | HR 81 | Temp 98.4°F | Ht 66.25 in | Wt 149.2 lb

## 2014-05-17 DIAGNOSIS — F439 Reaction to severe stress, unspecified: Secondary | ICD-10-CM

## 2014-05-17 DIAGNOSIS — Z733 Stress, not elsewhere classified: Secondary | ICD-10-CM

## 2014-05-17 DIAGNOSIS — R319 Hematuria, unspecified: Secondary | ICD-10-CM

## 2014-05-17 MED ORDER — SERTRALINE HCL 50 MG PO TABS: 50.0000 mg | ORAL_TABLET | Freq: Every day | ORAL | Status: DC

## 2014-05-17 NOTE — Progress Notes (Signed)
Pre visit review using our clinic review tool, if applicable. No additional management support is needed unless otherwise documented below in the visit note. 

## 2014-05-23 ENCOUNTER — Encounter: Payer: Self-pay | Admitting: Internal Medicine

## 2014-05-23 NOTE — Progress Notes (Signed)
  Subjective:    Patient ID: Kelly Tanner, female    DOB: [birth year], 53 y.o.   MRN: [id number]  HPI 53 year old female with past history of hypercholesterolemia and basal cell carcinoma (skin) who comes in today to follow for a scheduled follow up.  States she is doing well.  No headache or dizziness.  No sinus issues.  Saw Dr Jacqlyn Larsen.  Had cystoscopy.  Everything checked out fine.  Takes nitrofurantion prn.  Using estrace cream.  Feels better.  No blood.  Eating and drinking well.  Stays active.  Bowels stable.  Was having increased stress and some anxiety issues.  Started on zoloft last visit.  Had diarrhea initially after starting the medication.  This has resolved.  Appetite is better.  Tolerating the medication now.  Feels the zoloft is helping.  Feels better.  Does not feel she needs to increase the dose.        Past Medical History  Diagnosis Date  . Hyperlipidemia   . Basal cell carcinoma     Outpatient Encounter Prescriptions as of 05/17/2014  Medication Sig  . ALPRAZolam (XANAX) 0.25 MG tablet Take 1 tablet (0.25 mg total) by mouth daily as needed.  . diphenhydrAMINE (BENADRYL) 25 MG tablet Take 25 mg by mouth as needed for itching.  . estradiol (ESTRACE) 0.1 MG/GM vaginal cream Place 2 g vaginally daily.  . Ibuprofen-Diphenhydramine Cit (ADVIL PM PO) Take by mouth as needed.  . Multiple Vitamin (MULTIVITAMIN) tablet Take 1 tablet by mouth daily.  . nitrofurantoin, macrocrystal-monohydrate, (MACROBID) 100 MG capsule Take 100 mg by mouth 2 (two) times daily as needed.  . sertraline (ZOLOFT) 50 MG tablet Take 1 tablet (50 mg total) by mouth daily.  . [DISCONTINUED] sertraline (ZOLOFT) 50 MG tablet Take 1 tablet (50 mg total) by mouth daily.    Review of Systems Patient denies any headache, lightheadedness or dizziness.  No sinus or allergy symptoms.   No increased shortness of breath, cough or congestion.  No chest pain or tightness.  No palpitations.   No nausea or vomiting. No acid  reflux.  No abdominal pain or cramping.  No bowel change.  Overall she feels she is doing well.  Some increased stress as outlined.  Doing better on the zoloft.       Objective:   Physical Exam  Filed Vitals:   05/17/14 0801  BP: 130/78  Pulse: 81  Temp: 98.4 F (53.58 C)   53 year old female in no acute distress.   HEENT:  Nares- clear.  Oropharynx - without lesions. NECK:  Supple.  Nontender.  No audible bruit.  HEART:  Appears to be regular. LUNGS:  No crackles or wheezing audible.  Respirations even and unlabored.  RADIAL PULSE:  Equal bilaterally.    ABDOMEN:  Soft, nontender.  Bowel sounds present and normal.  No audible abdominal bruit.  EXTREMITIES:  No increased edema present.  DP pulses palpable and equal bilaterally.          Assessment & Plan:  HEALTH MAINTENANCE.  Physical 04/05/14.   Sees gyn.  Last pap 01/31/13 negative with negative HPV.   Mammogram 06/02/13 - Birads I.   Scheduled for a f/u mammogram.  Colonoscopy 10/13 - ok.

## 2014-05-23 NOTE — Assessment & Plan Note (Signed)
No reoccurrence.  Worked up by Dr Jacqlyn Larsen as outlined.  Follow.  No reoccurrence.

## 2014-05-23 NOTE — Assessment & Plan Note (Signed)
Increased stress as outlined.  See last note for details.   On zoloft.  Doing better.  Follow.  Continue current dose.

## 2014-06-03 LAB — HM MAMMOGRAPHY: HM MAMMO: NEGATIVE

## 2014-06-04 ENCOUNTER — Encounter: Payer: Self-pay | Admitting: *Deleted

## 2014-07-27 ENCOUNTER — Ambulatory Visit (INDEPENDENT_AMBULATORY_CARE_PROVIDER_SITE_OTHER): Payer: Federal, State, Local not specified - PPO | Admitting: Internal Medicine

## 2014-07-27 ENCOUNTER — Telehealth: Payer: Self-pay | Admitting: Internal Medicine

## 2014-07-27 ENCOUNTER — Encounter: Payer: Self-pay | Admitting: Internal Medicine

## 2014-07-27 VITALS — HR 90 | Temp 99.2°F | Resp 14 | Ht 66.25 in | Wt 149.5 lb

## 2014-07-27 DIAGNOSIS — L0292 Furuncle, unspecified: Secondary | ICD-10-CM

## 2014-07-27 DIAGNOSIS — Z733 Stress, not elsewhere classified: Secondary | ICD-10-CM

## 2014-07-27 DIAGNOSIS — L0293 Carbuncle, unspecified: Secondary | ICD-10-CM

## 2014-07-27 DIAGNOSIS — F439 Reaction to severe stress, unspecified: Secondary | ICD-10-CM

## 2014-07-27 MED ORDER — CEPHALEXIN 500 MG PO CAPS: 500.0000 mg | ORAL_CAPSULE | Freq: Three times a day (TID) | ORAL | Status: DC

## 2014-07-27 NOTE — Telephone Encounter (Signed)
Pt called in and stated is having a painful boil near panty line and would like to be seen by Dr.Scott today if possible. Please Advise.

## 2014-07-27 NOTE — Telephone Encounter (Signed)
Since I am not going to be here the rest of the week, I can see about working her in at the end of the day.  Just have her come in at 4:15.  May have to wait.  Confirm no other acute issues.  May not have lab, etc.

## 2014-07-27 NOTE — Progress Notes (Signed)
Pre visit review using our clinic review tool, if applicable. No additional management support is needed unless otherwise documented below in the visit note. 

## 2014-07-29 ENCOUNTER — Encounter: Payer: Self-pay | Admitting: Internal Medicine

## 2014-07-29 NOTE — Progress Notes (Signed)
  Subjective:    Patient ID: Kelly Tanner, female    DOB: [birth year], 54 y.o.   MRN: [id number]  HPI 53 year old female with past history of hypercholesterolemia and basal cell carcinoma (skin) who comes in today as a work in with concerns regarding a vaginal "boil".  States she first noticed in a couple of days ago.  Became more full and tender.  She soaked last night.  Just opened and started draining today.  Feels better.  No fever.  No abdominal pain or cramping.  No other urinary or vaginal problems.         Past Medical History  Diagnosis Date  . Hyperlipidemia   . Basal cell carcinoma     Outpatient Encounter Prescriptions as of 07/27/2014  Medication Sig  . ALPRAZolam (XANAX) 0.25 MG tablet Take 1 tablet (0.25 mg total) by mouth daily as needed.  . diphenhydrAMINE (BENADRYL) 25 MG tablet Take 25 mg by mouth as needed for itching.  . estradiol (ESTRACE) 0.1 MG/GM vaginal cream Place 2 g vaginally daily.  . Ibuprofen-Diphenhydramine Cit (ADVIL PM PO) Take by mouth as needed.  . Multiple Vitamin (MULTIVITAMIN) tablet Take 1 tablet by mouth daily.  . nitrofurantoin, macrocrystal-monohydrate, (MACROBID) 100 MG capsule Take 100 mg by mouth 2 (two) times daily as needed.  . sertraline (ZOLOFT) 50 MG tablet Take 1 tablet (50 mg total) by mouth daily.  . cephALEXin (KEFLEX) 500 MG capsule Take 1 capsule (500 mg total) by mouth 3 (three) times daily.    Review of Systems No fever.  No nausea or vomiting.  Vaginal "boil" as outlined.  Has drained.  Pain improved.  No urinary or vaginal problems. No itching or discharge.  No bowel change.  Taking her zoloft.  Family feels this is helping.  She is tolerating.       Objective:   Physical Exam  Filed Vitals:   07/27/14 1644  Pulse: 90  Temp: 99.2 F (37.3 C)  Resp: 80   53 year old female in no acute distress.     ABDOMEN:  Soft, nontender.  Bowel sounds present and normal.  No audible abdominal bruit.  GU:  Vaginal cyst/boil -  open and draining.  Minimal tenderness.  No other lesions.           Assessment & Plan:  HEALTH MAINTENANCE.  Physical 04/05/14.   Sees gyn.  Last pap 01/31/13 negative with negative HPV.   Mammogram 06/02/13 - Birads I.   Scheduled for a f/u mammogram.  Colonoscopy 10/13 - ok.

## 2014-07-29 NOTE — Assessment & Plan Note (Signed)
On zoloft.  Stable.  Follow.  

## 2014-07-29 NOTE — Assessment & Plan Note (Signed)
Vaginal lesion.  Is open and draining.  Hold on further I&D.  Warm compresses.  Soaks.  Keflex 500mg  tid x 1 week.  Follow.  Notify me if persistent problem or incomplete resolution.

## 2014-08-19 ENCOUNTER — Encounter: Payer: Self-pay | Admitting: Internal Medicine

## 2014-08-19 ENCOUNTER — Ambulatory Visit (INDEPENDENT_AMBULATORY_CARE_PROVIDER_SITE_OTHER): Payer: Federal, State, Local not specified - PPO | Admitting: Internal Medicine

## 2014-08-19 VITALS — BP 130/90 | HR 108 | Temp 99.1°F | Ht 66.25 in | Wt 150.5 lb

## 2014-08-19 DIAGNOSIS — J069 Acute upper respiratory infection, unspecified: Secondary | ICD-10-CM

## 2014-08-19 DIAGNOSIS — D649 Anemia, unspecified: Secondary | ICD-10-CM

## 2014-08-19 DIAGNOSIS — N3010 Interstitial cystitis (chronic) without hematuria: Secondary | ICD-10-CM

## 2014-08-19 DIAGNOSIS — Z733 Stress, not elsewhere classified: Secondary | ICD-10-CM

## 2014-08-19 DIAGNOSIS — Z800 Family history of malignant neoplasm of digestive organs: Secondary | ICD-10-CM

## 2014-08-19 DIAGNOSIS — F439 Reaction to severe stress, unspecified: Secondary | ICD-10-CM

## 2014-08-19 DIAGNOSIS — R030 Elevated blood-pressure reading, without diagnosis of hypertension: Secondary | ICD-10-CM

## 2014-08-19 DIAGNOSIS — L0293 Carbuncle, unspecified: Secondary | ICD-10-CM

## 2014-08-19 DIAGNOSIS — L0292 Furuncle, unspecified: Secondary | ICD-10-CM

## 2014-08-19 DIAGNOSIS — IMO0001 Reserved for inherently not codable concepts without codable children: Secondary | ICD-10-CM

## 2014-08-19 DIAGNOSIS — E7800 Pure hypercholesterolemia, unspecified: Secondary | ICD-10-CM

## 2014-08-19 MED ORDER — BENZONATATE 100 MG PO CAPS: 100.0000 mg | ORAL_CAPSULE | Freq: Three times a day (TID) | ORAL | Status: DC | PRN

## 2014-08-19 NOTE — Patient Instructions (Signed)
Saline nasal spray - flush nose at least 2-3x/day  nasacort - 2 sprays each nostril one time per day (do this in the evening).  Can take allegra if needed.    Tessalon perles if needed for cough

## 2014-08-19 NOTE — Progress Notes (Signed)
Pre visit review using our clinic review tool, if applicable. No additional management support is needed unless otherwise documented below in the visit note. 

## 2014-08-19 NOTE — Progress Notes (Signed)
  Subjective:    Patient ID: Kelly Tanner, female    DOB: 1961/10/06, 53 y.o.   MRN: [id number]  HPI 53 year old female with past history of hypercholesterolemia and basal cell carcinoma (skin) who comes in today to follow for a scheduled follow up.  States she is doing well.  No headache or dizziness.  Does report that her daughter has had a cold.  She noticed some increased cough starting five days ago.  Increased sinus drainage and nasal stuffiness.  Cough worse when lying down.  No sob or wheezing.  No chest tightness.  Has been using cough drops and taking Nyquil and tylenol sinus.  No acid reflux.  No vomiting or diarrhea.  Sees Dr Jacqlyn Larsen.  Had cystoscopy.  Everything checked out fine.  Takes nitrofurantion prn.  Using estrace cream.   Eating and drinking well.  Stays active.  Bowels stable.  Was having increased stress and some anxiety issues.  On zoloft.  Feels seh is doing better.  Wants to taper off the medication.          Past Medical History  Diagnosis Date  . Hyperlipidemia   . Basal cell carcinoma     Outpatient Encounter Prescriptions as of 08/19/2014  Medication Sig  . ALPRAZolam (XANAX) 0.25 MG tablet Take 1 tablet (0.25 mg total) by mouth daily as needed.  . diphenhydrAMINE (BENADRYL) 25 MG tablet Take 25 mg by mouth as needed for itching.  . estradiol (ESTRACE) 0.1 MG/GM vaginal cream Place 2 g vaginally daily.  . Ibuprofen-Diphenhydramine Cit (ADVIL PM PO) Take by mouth as needed.  . Multiple Vitamin (MULTIVITAMIN) tablet Take 1 tablet by mouth daily.  . nitrofurantoin, macrocrystal-monohydrate, (MACROBID) 100 MG capsule Take 100 mg by mouth 2 (two) times daily as needed.  . sertraline (ZOLOFT) 50 MG tablet Take 1 tablet (50 mg total) by mouth daily.  . [DISCONTINUED] cephALEXin (KEFLEX) 500 MG capsule Take 1 capsule (500 mg total) by mouth 3 (three) times daily.    Review of Systems Patient denies any headache, lightheadedness or dizziness.  Sinus symptoms as outlined.   No increased shortness of breath.  Some increased congestion and cough.  No wheezing.  No chest pain or tightness.  No palpitations.   No nausea or vomiting.  No acid reflux.  No abdominal pain or cramping.  No bowel change.  Overall she feels she is doing well.  Feels she is handling stress relatively well.  Wants to taper zoloft.      Objective:   Physical Exam  Filed Vitals:   08/19/14 1417  BP: 130/90  Pulse: 108  Temp: 99.1 F (70.19 C)   53 year old female in no acute distress.   HEENT:  Nares- clear.  Oropharynx - without lesions.  No significant tenderness to palpation over the sinuses.  NECK:  Supple.  Nontender.  No audible bruit.  HEART:  Appears to be regular. LUNGS:  No crackles or wheezing audible.  Respirations even and unlabored.  RADIAL PULSE:  Equal bilaterally.    ABDOMEN:  Soft, nontender.  Bowel sounds present and normal.  No audible abdominal bruit.  EXTREMITIES:  No increased edema present.  DP pulses palpable and equal bilaterally.          Assessment & Plan:  HEALTH MAINTENANCE.  Physical 04/05/14.   Sees gyn.  Last pap 01/31/13 negative with negative HPV.   Mammogram 06/03/14 - Birads I.  Colonoscopy 10/13 - ok.

## 2014-08-23 ENCOUNTER — Encounter: Payer: Self-pay | Admitting: Internal Medicine

## 2014-08-23 DIAGNOSIS — R030 Elevated blood-pressure reading, without diagnosis of hypertension: Secondary | ICD-10-CM | POA: Insufficient documentation

## 2014-08-23 NOTE — Assessment & Plan Note (Signed)
Resolved

## 2014-08-23 NOTE — Assessment & Plan Note (Signed)
Last check normal.  Follow cbc/ferritin.  Up to date with colonoscopy.

## 2014-08-23 NOTE — Assessment & Plan Note (Signed)
Blood pressure recheck:  132/84-86.  Have her spot check her pressure.  Send in readings.

## 2014-08-23 NOTE — Assessment & Plan Note (Signed)
Low cholesterol diet and exercise.  Follow lipid panel.   

## 2014-08-23 NOTE — Assessment & Plan Note (Signed)
Evaluated at [facility].  Had cystoscopy and CT.  Recently evaluated by Dr Jacqlyn Larsen.  Is being followed by Dr Jacqlyn Larsen now.  Cystoscopy ok.  Takes nitrofurantoin prn.  Doing well. States does not have interstitial cystitis.

## 2014-08-23 NOTE — Assessment & Plan Note (Signed)
Feels she is handling things relatively well.  On zoloft.  Desires to taper the medication.  Instructions on how to taper given.  Follow.

## 2014-08-23 NOTE — Assessment & Plan Note (Signed)
Some cough and congestion as outlined.  Saline nasal spray and nasacort as directed.  Robitussin as directed.  Tessalon perles if needed.  Follow.  Do not feel abx warranted at this time.

## 2014-08-23 NOTE — Assessment & Plan Note (Signed)
Colonoscopy 10/13.    Follow up 5 years.

## 2014-11-18 ENCOUNTER — Ambulatory Visit (INDEPENDENT_AMBULATORY_CARE_PROVIDER_SITE_OTHER): Payer: Federal, State, Local not specified - PPO | Admitting: Internal Medicine

## 2014-11-18 ENCOUNTER — Encounter: Payer: Self-pay | Admitting: Internal Medicine

## 2014-11-18 VITALS — BP 130/82 | HR 98 | Temp 98.0°F | Ht 66.25 in | Wt 150.8 lb

## 2014-11-18 DIAGNOSIS — R030 Elevated blood-pressure reading, without diagnosis of hypertension: Secondary | ICD-10-CM

## 2014-11-18 DIAGNOSIS — Z658 Other specified problems related to psychosocial circumstances: Secondary | ICD-10-CM

## 2014-11-18 DIAGNOSIS — F439 Reaction to severe stress, unspecified: Secondary | ICD-10-CM

## 2014-11-18 DIAGNOSIS — IMO0001 Reserved for inherently not codable concepts without codable children: Secondary | ICD-10-CM

## 2014-11-18 DIAGNOSIS — N3010 Interstitial cystitis (chronic) without hematuria: Secondary | ICD-10-CM

## 2014-11-18 DIAGNOSIS — E7800 Pure hypercholesterolemia, unspecified: Secondary | ICD-10-CM

## 2014-11-18 DIAGNOSIS — Z800 Family history of malignant neoplasm of digestive organs: Secondary | ICD-10-CM

## 2014-11-18 MED ORDER — ALPRAZOLAM 0.25 MG PO TABS: 0.2500 mg | ORAL_TABLET | Freq: Every day | ORAL | Status: DC | PRN

## 2014-11-18 NOTE — Progress Notes (Signed)
Pre visit review using our clinic review tool, if applicable. No additional management support is needed unless otherwise documented below in the visit note. 

## 2014-11-18 NOTE — Progress Notes (Signed)
Subjective:    Patient ID: Kelly Tanner, female    DOB: [birth year], 53 y.o.   MRN: [id number]  HPI 53 year old female with past history of hypercholesterolemia and basal cell carcinoma (skin) who comes in today to follow for a scheduled follow up.  States she is doing well.  No headache or dizziness.   No sob or wheezing.  No chest tightness.  No acid reflux.  No vomiting or diarrhea.  Sees Dr Jacqlyn Larsen.  Had cystoscopy.  Everything checked out fine.  Takes nitrofurantion prn.  Using estrace cream.   Bladder symptoms doing well.  Eating and drinking well.  Stays active.  Bowels stable.  Was having increased stress and some anxiety issues.  Was on zoloft.  Off now and doing well.  Feels she is doing better.           Past Medical History  Diagnosis Date  . Hyperlipidemia   . Basal cell carcinoma     Outpatient Encounter Prescriptions as of 11/18/2014  Medication Sig  . ALPRAZolam (XANAX) 0.25 MG tablet Take 1 tablet (0.25 mg total) by mouth daily as needed.  . diphenhydrAMINE (BENADRYL) 25 MG tablet Take 25 mg by mouth as needed for itching.  . estradiol (ESTRACE) 0.1 MG/GM vaginal cream Place 2 g vaginally daily.  . Ibuprofen-Diphenhydramine Cit (ADVIL PM PO) Take by mouth as needed.  . [DISCONTINUED] benzonatate (TESSALON) 100 MG capsule Take 1 capsule (100 mg total) by mouth 3 (three) times daily as needed for cough.  . [DISCONTINUED] Multiple Vitamin (MULTIVITAMIN) tablet Take 1 tablet by mouth daily.  . [DISCONTINUED] nitrofurantoin, macrocrystal-monohydrate, (MACROBID) 100 MG capsule Take 100 mg by mouth 2 (two) times daily as needed.  . [DISCONTINUED] sertraline (ZOLOFT) 50 MG tablet Take 1 tablet (50 mg total) by mouth daily.    Review of Systems Patient denies any headache, lightheadedness or dizziness.  no increased sinus or allergy symptoms reported today.   No increased shortness of breath.  No cough or congestion.  No wheezing.  No chest pain or tightness.  No palpitations.   No  nausea or vomiting.  No acid reflux.  No abdominal pain or cramping.  No bowel change.  Overall she feels she is doing well.  Feels she is handling stress relatively well.  Off zoloft.      Objective:   Physical Exam  Filed Vitals:   11/18/14 1516  BP: 130/82  Pulse: 98  Temp: 98 F (36.7 C)   Blood pressure recheck:  68/42  53 year old female in no acute distress.   HEENT:  Nares- clear.  Oropharynx - without lesions.  No significant tenderness to palpation over the sinuses.  NECK:  Supple.  Nontender.  No audible bruit.  HEART:  Appears to be regular. LUNGS:  No crackles or wheezing audible.  Respirations even and unlabored.  RADIAL PULSE:  Equal bilaterally.    ABDOMEN:  Soft, nontender.  Bowel sounds present and normal.  No audible abdominal bruit.  EXTREMITIES:  No increased edema present.  DP pulses palpable and equal bilaterally.          Assessment & Plan:  1. Interstitial cystitis Sees urology.  Stable.    2. Hypercholesterolemia Low cholesterol diet and exercise.  Desires not to take medication.  Follow.   Lab Results  Component Value Date   CHOL 191 03/31/2014   HDL 36.40* 03/31/2014   LDLCALC 121* 03/31/2014   TRIG 169.0* 03/31/2014   CHOLHDL 5 03/31/2014  3. Family history of colon cancer Colonoscopy 09/2012.  Recommended f/u in five years.    4. Stress Feels she is doing well.  Off zoloft.  Follow.    5. Elevated blood pressure Blood pressure readings as outlined.  Follow.  Doing better.    HEALTH MAINTENANCE.  Physical 04/05/14.   Sees gyn.  Last pap 01/31/13 negative with negative HPV.   Mammogram 06/03/14 - Birads I.  Colonoscopy 10/13 - ok.

## 2014-11-28 ENCOUNTER — Encounter: Payer: Self-pay | Admitting: Internal Medicine

## 2014-12-23 DIAGNOSIS — N393 Stress incontinence (female) (male): Secondary | ICD-10-CM | POA: Insufficient documentation

## 2015-04-05 ENCOUNTER — Other Ambulatory Visit: Payer: Federal, State, Local not specified - PPO

## 2015-04-07 ENCOUNTER — Encounter: Payer: Federal, State, Local not specified - PPO | Admitting: Internal Medicine

## 2015-04-07 ENCOUNTER — Telehealth: Payer: Self-pay | Admitting: *Deleted

## 2015-04-07 DIAGNOSIS — IMO0001 Reserved for inherently not codable concepts without codable children: Secondary | ICD-10-CM

## 2015-04-07 DIAGNOSIS — R319 Hematuria, unspecified: Secondary | ICD-10-CM

## 2015-04-07 DIAGNOSIS — D649 Anemia, unspecified: Secondary | ICD-10-CM

## 2015-04-07 DIAGNOSIS — R030 Elevated blood-pressure reading, without diagnosis of hypertension: Secondary | ICD-10-CM

## 2015-04-07 DIAGNOSIS — E7800 Pure hypercholesterolemia, unspecified: Secondary | ICD-10-CM

## 2015-04-07 NOTE — Telephone Encounter (Signed)
Orders placed for labs

## 2015-04-07 NOTE — Telephone Encounter (Signed)
Labs and dx?  

## 2015-04-08 ENCOUNTER — Other Ambulatory Visit (INDEPENDENT_AMBULATORY_CARE_PROVIDER_SITE_OTHER): Payer: Federal, State, Local not specified - PPO

## 2015-04-08 DIAGNOSIS — R030 Elevated blood-pressure reading, without diagnosis of hypertension: Secondary | ICD-10-CM | POA: Diagnosis not present

## 2015-04-08 DIAGNOSIS — D649 Anemia, unspecified: Secondary | ICD-10-CM

## 2015-04-08 DIAGNOSIS — E7800 Pure hypercholesterolemia, unspecified: Secondary | ICD-10-CM

## 2015-04-08 DIAGNOSIS — IMO0001 Reserved for inherently not codable concepts without codable children: Secondary | ICD-10-CM

## 2015-04-08 LAB — LIPID PANEL
CHOL/HDL RATIO: 5
Cholesterol: 161 mg/dL (ref 0–200)
HDL: 31.1 mg/dL — AB (ref >39.00)
LDL Cholesterol: 99 mg/dL (ref 0–99)
NONHDL: 129.9
Triglycerides: 153 mg/dL — ABNORMAL HIGH (ref 0.0–149.0)
VLDL: 30.6 mg/dL (ref 0.0–40.0)

## 2015-04-08 LAB — CBC WITH DIFFERENTIAL/PLATELET
BASOS ABS: 0 10*3/uL (ref 0.0–0.1)
Basophils Relative: 0.4 % (ref 0.0–3.0)
EOS ABS: 0.2 10*3/uL (ref 0.0–0.7)
EOS PCT: 1.9 % (ref 0.0–5.0)
HEMATOCRIT: 34.3 % — AB (ref 36.0–46.0)
HEMOGLOBIN: 11.7 g/dL — AB (ref 12.0–15.0)
Lymphocytes Relative: 20.3 % (ref 12.0–46.0)
Lymphs Abs: 1.7 10*3/uL (ref 0.7–4.0)
MCHC: 34.1 g/dL (ref 30.0–36.0)
MCV: 85.9 fl (ref 78.0–100.0)
MONO ABS: 0.4 10*3/uL (ref 0.1–1.0)
Monocytes Relative: 5.2 % (ref 3.0–12.0)
NEUTROS ABS: 6.2 10*3/uL (ref 1.4–7.7)
NEUTROS PCT: 72.2 % (ref 43.0–77.0)
Platelets: 257 10*3/uL (ref 150.0–400.0)
RBC: 3.99 Mil/uL (ref 3.87–5.11)
RDW: 14.1 % (ref 11.5–15.5)
WBC: 8.6 10*3/uL (ref 4.0–10.5)

## 2015-04-08 LAB — COMPREHENSIVE METABOLIC PANEL
ALBUMIN: 4.2 g/dL (ref 3.5–5.2)
ALT: 17 U/L (ref 0–35)
AST: 17 U/L (ref 0–37)
Alkaline Phosphatase: 107 U/L (ref 39–117)
BILIRUBIN TOTAL: 0.4 mg/dL (ref 0.2–1.2)
BUN: 13 mg/dL (ref 6–23)
CALCIUM: 9.9 mg/dL (ref 8.4–10.5)
CO2: 28 mEq/L (ref 19–32)
CREATININE: 0.79 mg/dL (ref 0.40–1.20)
Chloride: 101 mEq/L (ref 96–112)
GFR: 80.55 mL/min (ref >60.00)
Glucose, Bld: 93 mg/dL (ref 70–99)
POTASSIUM: 4.5 meq/L (ref 3.5–5.1)
Sodium: 136 mEq/L (ref 135–145)
TOTAL PROTEIN: 7.6 g/dL (ref 6.0–8.3)

## 2015-04-08 LAB — TSH: TSH: 2.46 u[IU]/mL (ref 0.35–4.50)

## 2015-04-09 ENCOUNTER — Encounter: Payer: Self-pay | Admitting: Internal Medicine

## 2015-04-12 ENCOUNTER — Other Ambulatory Visit (HOSPITAL_COMMUNITY)
Admission: RE | Admit: 2015-04-12 | Discharge: 2015-04-12 | Disposition: A | Discharge status: Home / Self Care | Payer: Federal, State, Local not specified - PPO | Source: Ambulatory Visit | Attending: Internal Medicine | Admitting: Internal Medicine

## 2015-04-12 ENCOUNTER — Encounter: Payer: Self-pay | Admitting: Internal Medicine

## 2015-04-12 ENCOUNTER — Ambulatory Visit (INDEPENDENT_AMBULATORY_CARE_PROVIDER_SITE_OTHER): Payer: Federal, State, Local not specified - PPO | Admitting: Internal Medicine

## 2015-04-12 VITALS — BP 130/80 | HR 96 | Temp 98.3°F | Ht 66.25 in | Wt 150.1 lb

## 2015-04-12 DIAGNOSIS — N3010 Interstitial cystitis (chronic) without hematuria: Secondary | ICD-10-CM | POA: Diagnosis not present

## 2015-04-12 DIAGNOSIS — IMO0001 Reserved for inherently not codable concepts without codable children: Secondary | ICD-10-CM

## 2015-04-12 DIAGNOSIS — D649 Anemia, unspecified: Secondary | ICD-10-CM | POA: Diagnosis not present

## 2015-04-12 DIAGNOSIS — F439 Reaction to severe stress, unspecified: Secondary | ICD-10-CM

## 2015-04-12 DIAGNOSIS — Z113 Encounter for screening for infections with a predominantly sexual mode of transmission: Secondary | ICD-10-CM | POA: Insufficient documentation

## 2015-04-12 DIAGNOSIS — Z01419 Encounter for gynecological examination (general) (routine) without abnormal findings: Secondary | ICD-10-CM | POA: Insufficient documentation

## 2015-04-12 DIAGNOSIS — Z658 Other specified problems related to psychosocial circumstances: Secondary | ICD-10-CM

## 2015-04-12 DIAGNOSIS — Z1239 Encounter for other screening for malignant neoplasm of breast: Secondary | ICD-10-CM

## 2015-04-12 DIAGNOSIS — C4490 Unspecified malignant neoplasm of skin, unspecified: Secondary | ICD-10-CM | POA: Diagnosis not present

## 2015-04-12 DIAGNOSIS — R030 Elevated blood-pressure reading, without diagnosis of hypertension: Secondary | ICD-10-CM

## 2015-04-12 DIAGNOSIS — Z0000 Encounter for general adult medical examination without abnormal findings: Secondary | ICD-10-CM

## 2015-04-12 DIAGNOSIS — E7800 Pure hypercholesterolemia, unspecified: Secondary | ICD-10-CM

## 2015-04-12 DIAGNOSIS — Z800 Family history of malignant neoplasm of digestive organs: Secondary | ICD-10-CM

## 2015-04-12 MED ORDER — ALPRAZOLAM 0.25 MG PO TABS: 0.2500 mg | ORAL_TABLET | Freq: Every day | ORAL | Status: DC | PRN

## 2015-04-12 NOTE — Progress Notes (Signed)
Pre visit review using our clinic review tool, if applicable. No additional management support is needed unless otherwise documented below in the visit note. 

## 2015-04-12 NOTE — Progress Notes (Signed)
Patient ID: Kelly Tanner, female   DOB: 1961-04-28, 54 y.o.   MRN: [id number]   Subjective:    Patient ID: Kelly Tanner, female    DOB: 04-16-1961, 54 y.o.   MRN: [id number]  HPI  Patient here for a physical.  States she is doing well.  Is walking.  Feels good.  Takes alprazolam prn.  Only takes one time per week on average.  Feels she is handling stress relatively well.  Eating and drinking well.  No cardiac symptoms with increased activity or exertion.  No sob.  Bowels stable.     Past Medical History  Diagnosis Date  . Hyperlipidemia   . Basal cell carcinoma     Outpatient Encounter Prescriptions as of 04/12/2015  Medication Sig  . ALPRAZolam (XANAX) 0.25 MG tablet Take 1 tablet (0.25 mg total) by mouth daily as needed.  . diphenhydrAMINE (BENADRYL) 25 MG tablet Take 25 mg by mouth as needed for itching.  . estradiol (ESTRACE) 0.1 MG/GM vaginal cream Place 2 g vaginally daily.  . Ibuprofen-Diphenhydramine Cit (ADVIL PM PO) Take by mouth as needed.  . [DISCONTINUED] ALPRAZolam (XANAX) 0.25 MG tablet Take 1 tablet (0.25 mg total) by mouth daily as needed.  . [DISCONTINUED] Multiple Vitamin (MULTIVITAMIN) tablet Take by mouth daily.   No facility-administered encounter medications on file as of 04/12/2015.    Review of Systems  Constitutional: Negative for appetite change and unexpected weight change.  HENT: Negative for congestion and sinus pressure.   Eyes: Negative for pain and visual disturbance.  Respiratory: Negative for cough, chest tightness and shortness of breath.   Cardiovascular: Negative for chest pain, palpitations and leg swelling.  Gastrointestinal: Negative for nausea, vomiting, abdominal pain and diarrhea.  Genitourinary: Negative for dysuria and difficulty urinating.  Musculoskeletal: Negative for back pain and joint swelling.  Skin: Negative for color change and rash.  Neurological: Negative for dizziness, light-headedness and headaches.  Hematological:  Negative for adenopathy. Does not bruise/bleed easily.  Psychiatric/Behavioral: Negative for dysphoric mood and agitation.       Objective:     Blood pressure recheck:  120/76, pulse 80  Physical Exam  Constitutional: She is oriented to person, place, and time. She appears well-developed and well-nourished.  HENT:  Nose: Nose normal.  Mouth/Throat: Oropharynx is clear and moist.  Eyes: Right eye exhibits no discharge. Left eye exhibits no discharge. No scleral icterus.  Neck: Neck supple. No thyromegaly present.  Cardiovascular: Normal rate and regular rhythm.   Pulmonary/Chest: Breath sounds normal. No accessory muscle usage. No tachypnea. No respiratory distress. She has no decreased breath sounds. She has no wheezes. She has no rhonchi. Right breast exhibits no inverted nipple, no mass, no nipple discharge and no tenderness (no axillary adenopathy). Left breast exhibits no inverted nipple, no mass, no nipple discharge and no tenderness (no axilarry adenopathy).  Abdominal: Soft. Bowel sounds are normal. There is no tenderness.  Genitourinary:  Normal external genitalia.  Vaginal vault without lesions.  Cervix identified.  Pap smear performed.  Could not appreciate any adnexal masses or tenderness.    Musculoskeletal: She exhibits no edema or tenderness.  Lymphadenopathy:    She has no cervical adenopathy.  Neurological: She is alert and oriented to person, place, and time.  Skin: Skin is warm. No rash noted.  Psychiatric: She has a normal mood and affect. Her behavior is normal.    BP 130/80 mmHg  Pulse 96  Temp(Src) 98.3 F (36.8 C) (Oral)  Ht 5' 6.25" (  1.683 m)  Wt 150 lb 2 oz (68.096 kg)  BMI 24.04 kg/m2  SpO2 98%  LMP 11/02/2010 Wt Readings from Last 3 Encounters:  04/12/15 150 lb 2 oz (68.096 kg)  11/18/14 150 lb 12 oz (68.38 kg)  08/19/14 150 lb 8 oz (68.266 kg)     Lab Results  Component Value Date   WBC 8.6 04/08/2015   HGB 11.7* 04/08/2015   HCT 34.3*  04/08/2015   PLT 257.0 04/08/2015   GLUCOSE 93 04/08/2015   CHOL 161 04/08/2015   TRIG 153.0* 04/08/2015   HDL 31.10* 04/08/2015   LDLCALC 99 04/08/2015   ALT 17 04/08/2015   AST 17 04/08/2015   NA 136 04/08/2015   K 4.5 04/08/2015   CL 101 04/08/2015   CREATININE 0.79 04/08/2015   BUN 13 04/08/2015   CO2 28 04/08/2015   TSH 2.46 04/08/2015       Assessment & Plan:   Problem List Items Addressed This Visit    Anemia - Primary    Last check slightly.  Not taking any iron.  Check cbc, ferritin, ibc panel and B12 with next labs.Marland Kitchen  Up to date with colonoscopy.       Relevant Orders   CBC with Differential/Platelet   Ferritin   Vitamin B12   IBC panel   Cytology - PAP (Completed)   Elevated blood pressure    Blood pressure today under good control.  Follow.       Family history of colon cancer    Colonoscopy 09/2012.  Recommended f/u in five years.       Health care maintenance    Physical 04/12/15.  Colonoscopy 09/2012.  Recommended follow up in 5 years.  PAP 04/12/15.  Mammogram 06/03/14 - Birads I.  Schedule f/u mammogram.  She will schedule.        Hypercholesterolemia    Low cholesterol diet and exercise.  Cholesterol checked 04/08/15 - LDL 99.  Follow.        Interstitial cystitis    Evaluated at [facility].  Had cystoscopy and CT.  Now followed by Dr Jacqlyn Larsen.  Takes nitrofurantoin prn.  Currently doing well.  Follow.        Skin cancer    Has a history of basal cell.  Followed by Dr Evorn Gong.       Relevant Medications   ALPRAZolam (XANAX) 0.25 MG tablet   Stress    Overall she feels she is doing well.   Takes xanax prn.  Follow.        Other Visit Diagnoses    Breast cancer screening        Relevant Orders    MM DIGITAL SCREENING BILATERAL    Cytology - PAP (Completed)      I spent 25 minutes with the patient and more than 50% of the time was spent in consultation regarding the above.     Einar Pheasant, MD

## 2015-04-13 ENCOUNTER — Encounter: Payer: Self-pay | Admitting: Internal Medicine

## 2015-04-13 LAB — CYTOLOGY - PAP

## 2015-04-17 ENCOUNTER — Encounter: Payer: Self-pay | Admitting: Internal Medicine

## 2015-04-17 DIAGNOSIS — Z0000 Encounter for general adult medical examination without abnormal findings: Secondary | ICD-10-CM | POA: Insufficient documentation

## 2015-04-17 NOTE — Assessment & Plan Note (Addendum)
Last check slightly.  Not taking any iron.  Check cbc, ferritin, ibc panel and B12 with next labs.Marland Kitchen  Up to date with colonoscopy.

## 2015-04-17 NOTE — Assessment & Plan Note (Signed)
Evaluated at [facility].  Had cystoscopy and CT.  Now followed by Dr Jacqlyn Larsen.  Takes nitrofurantoin prn.  Currently doing well.  Follow.

## 2015-04-17 NOTE — Assessment & Plan Note (Signed)
Physical 04/12/15.  Colonoscopy 09/2012.  Recommended follow up in 5 years.  PAP 04/12/15.  Mammogram 06/03/14 - Birads I.  Schedule f/u mammogram.  She will schedule.

## 2015-04-17 NOTE — Assessment & Plan Note (Signed)
Has a history of basal cell.  Followed by Dr Evorn Gong.

## 2015-04-17 NOTE — Assessment & Plan Note (Signed)
Colonoscopy 09/2012.  Recommended f/u in five years.

## 2015-04-17 NOTE — Assessment & Plan Note (Signed)
Blood pressure today under good control.  Follow.

## 2015-04-17 NOTE — Assessment & Plan Note (Signed)
Low cholesterol diet and exercise.  Cholesterol checked 04/08/15 - LDL 99.  Follow.

## 2015-04-17 NOTE — Assessment & Plan Note (Signed)
Overall she feels she is doing well.   Takes xanax prn.  Follow.

## 2015-04-28 ENCOUNTER — Other Ambulatory Visit (INDEPENDENT_AMBULATORY_CARE_PROVIDER_SITE_OTHER): Payer: Federal, State, Local not specified - PPO

## 2015-04-28 ENCOUNTER — Encounter: Payer: Self-pay | Admitting: Internal Medicine

## 2015-04-28 DIAGNOSIS — D649 Anemia, unspecified: Secondary | ICD-10-CM | POA: Diagnosis not present

## 2015-04-28 LAB — CBC WITH DIFFERENTIAL/PLATELET
BASOS PCT: 0.5 % (ref 0.0–3.0)
Basophils Absolute: 0 10*3/uL (ref 0.0–0.1)
EOS PCT: 1.3 % (ref 0.0–5.0)
Eosinophils Absolute: 0.1 10*3/uL (ref 0.0–0.7)
HEMATOCRIT: 36.4 % (ref 36.0–46.0)
Hemoglobin: 12.3 g/dL (ref 12.0–15.0)
LYMPHS ABS: 1.6 10*3/uL (ref 0.7–4.0)
LYMPHS PCT: 16.8 % (ref 12.0–46.0)
MCHC: 33.6 g/dL (ref 30.0–36.0)
MCV: 85.7 fl (ref 78.0–100.0)
MONOS PCT: 3.7 % (ref 3.0–12.0)
Monocytes Absolute: 0.3 10*3/uL (ref 0.1–1.0)
NEUTROS ABS: 7.2 10*3/uL (ref 1.4–7.7)
Neutrophils Relative %: 77.7 % — ABNORMAL HIGH (ref 43.0–77.0)
Platelets: 281 10*3/uL (ref 150.0–400.0)
RBC: 4.25 Mil/uL (ref 3.87–5.11)
RDW: 14.8 % (ref 11.5–15.5)
WBC: 9.3 10*3/uL (ref 4.0–10.5)

## 2015-04-28 LAB — IBC PANEL
Iron: 43 ug/dL (ref 42–145)
SATURATION RATIOS: 12.6 % — AB (ref 20.0–50.0)
Transferrin: 243 mg/dL (ref 212.0–360.0)

## 2015-04-28 LAB — FERRITIN: FERRITIN: 68.3 ng/mL (ref 10.0–291.0)

## 2015-04-28 LAB — VITAMIN B12: Vitamin B-12: 354 pg/mL (ref 211–911)

## 2015-05-03 ENCOUNTER — Other Ambulatory Visit: Payer: Federal, State, Local not specified - PPO

## 2015-06-06 ENCOUNTER — Encounter: Payer: Self-pay | Admitting: *Deleted

## 2015-06-06 LAB — HM MAMMOGRAPHY: HM Mammogram: NEGATIVE

## 2015-09-08 ENCOUNTER — Encounter: Payer: Self-pay | Admitting: Internal Medicine

## 2015-09-13 ENCOUNTER — Ambulatory Visit (INDEPENDENT_AMBULATORY_CARE_PROVIDER_SITE_OTHER): Payer: Federal, State, Local not specified - PPO | Admitting: Internal Medicine

## 2015-09-13 ENCOUNTER — Encounter: Payer: Self-pay | Admitting: Internal Medicine

## 2015-09-13 VITALS — BP 126/80 | HR 102 | Temp 98.6°F | Resp 18 | Ht 66.25 in | Wt 151.5 lb

## 2015-09-13 DIAGNOSIS — N3010 Interstitial cystitis (chronic) without hematuria: Secondary | ICD-10-CM | POA: Diagnosis not present

## 2015-09-13 DIAGNOSIS — F439 Reaction to severe stress, unspecified: Secondary | ICD-10-CM

## 2015-09-13 DIAGNOSIS — Z658 Other specified problems related to psychosocial circumstances: Secondary | ICD-10-CM | POA: Diagnosis not present

## 2015-09-13 DIAGNOSIS — R102 Pelvic and perineal pain: Secondary | ICD-10-CM | POA: Diagnosis not present

## 2015-09-13 DIAGNOSIS — R197 Diarrhea, unspecified: Secondary | ICD-10-CM | POA: Diagnosis not present

## 2015-09-13 MED ORDER — FLUOXETINE HCL 10 MG PO CAPS: 10.0000 mg | ORAL_CAPSULE | Freq: Every day | ORAL | Status: DC

## 2015-09-13 NOTE — Progress Notes (Signed)
Patient ID: Kelly Tanner, female   DOB: 08-May-1961, 54 y.o.   MRN: [id number]   Subjective:    Patient ID: Kelly Tanner, female    DOB: [birth year], 54 y.o.   MRN: [id number]  HPI  Patient with past history of anemia and hypercholesterolemia who comes in today as a work in with concerns regarding persistent diarrhea.  States had cataract surgery. Is doing fine from the surgery.  Around that time, she lifted a heavy cooler.  Noticed some back and left leg pain.  Since that time, she has noticed some pain in the vaginal/rectal area.  Has not lifted for three weeks.  Back and leg are better.  She still feels some discomfort between her vaginal area and rectal area.  No significant pain.  No vaginal discharge.  No pain up in the vaginal area.  She has a history of IBS.  Started one month ago with diarrhea.  Would eat and then have cramping and diarrhea.  May have three episodes per day.  No abdominal pain.  No nausea or vomiting.  Has a good appetite.  Took immodium x 1.  Diarrhea improved.  This was two weeks ago.  Returned.  Is better now.  Started taking probiotics.  Bowel is basically back to normal.  No pain.  She does want me to check her vaginal area.  Increased stress.  Feels she needs to be back on something.  Did not feel zoloft worked for her.     Past Medical History  Diagnosis Date  . Hyperlipidemia   . Basal cell carcinoma    Past Surgical History  Procedure Laterality Date  . Cyst removal neck    . Refractive surgery    . Skin cancer excision  2001    basal cell   Family History  Problem Relation Age of Onset  . Lung cancer      parent  . Colon cancer      parent   Social History   Social History  . Marital Status: Married    Spouse Name: N/A  . Number of Children: 2  . Years of Education: N/A   Social History Main Topics  . Smoking status: Never Smoker   . Smokeless tobacco: Never Used  . Alcohol Use: 0.0 oz/week    0 Standard drinks or equivalent per week  .  Drug Use: No  . Sexual Activity: Not Asked   Other Topics Concern  . None   Social History Narrative    Outpatient Encounter Prescriptions as of 09/13/2015  Medication Sig  . ALPRAZolam (XANAX) 0.25 MG tablet Take 1 tablet (0.25 mg total) by mouth daily as needed.  . diphenhydrAMINE (BENADRYL) 25 MG tablet Take 25 mg by mouth as needed for itching.  . estradiol (ESTRACE) 0.1 MG/GM vaginal cream Place 2 g vaginally daily.  . Ibuprofen-Diphenhydramine Cit (ADVIL PM PO) Take by mouth as needed.  Marland Kitchen FLUoxetine (PROZAC) 10 MG capsule Take 1 capsule (10 mg total) by mouth daily.   No facility-administered encounter medications on file as of 09/13/2015.    Review of Systems  Constitutional: Negative for appetite change and unexpected weight change.  HENT: Negative for congestion and sinus pressure.   Respiratory: Negative for cough, chest tightness and shortness of breath.   Cardiovascular: Negative for chest pain, palpitations and leg swelling.  Gastrointestinal: Positive for diarrhea. Negative for nausea, vomiting and abdominal pain.  Genitourinary: Negative for dysuria and difficulty urinating.  No vaginal itching or discharge.   Musculoskeletal: Negative for back pain and joint swelling.  Skin: Negative for color change and rash.  Neurological: Negative for dizziness, light-headedness and headaches.  Psychiatric/Behavioral:       Increased stress.  Feels she needs to be on something for her nerves and anxiety.         Objective:    Physical Exam  Constitutional: She appears well-developed and well-nourished. No distress.  HENT:  Nose: Nose normal.  Mouth/Throat: Oropharynx is clear and moist.  Eyes: Conjunctivae are normal. Right eye exhibits no discharge. Left eye exhibits no discharge.  Neck: Neck supple. No thyromegaly present.  Cardiovascular: Normal rate and regular rhythm.   Pulmonary/Chest: Breath sounds normal. No respiratory distress. She has no wheezes.    Abdominal: Soft. Bowel sounds are normal. There is no tenderness.  Genitourinary:  Normal external genitalia.  Vaginal vault without lesions.  Could not appreciate any adnexal masses or tenderness.  Rectal exam - reveals no mass.    Musculoskeletal: She exhibits no edema or tenderness.  Lymphadenopathy:    She has no cervical adenopathy.  Skin: No rash noted. No erythema.  Psychiatric: She has a normal mood and affect. Her behavior is normal.    BP 126/80 mmHg  Pulse 102  Temp(Src) 98.6 F (37 C) (Oral)  Resp 18  Ht 5' 6.25" (1.683 m)  Wt 151 lb 8 oz (68.72 kg)  BMI 24.26 kg/m2  SpO2 98%  LMP 11/02/2010 Wt Readings from Last 3 Encounters:  09/13/15 151 lb 8 oz (68.72 kg)  04/12/15 150 lb 2 oz (68.096 kg)  11/18/14 150 lb 12 oz (68.38 kg)     Lab Results  Component Value Date   WBC 9.3 04/28/2015   HGB 12.3 04/28/2015   HCT 36.4 04/28/2015   PLT 281.0 04/28/2015   GLUCOSE 93 04/08/2015   CHOL 161 04/08/2015   TRIG 153.0* 04/08/2015   HDL 31.10* 04/08/2015   LDLCALC 99 04/08/2015   ALT 17 04/08/2015   AST 17 04/08/2015   NA 136 04/08/2015   K 4.5 04/08/2015   CL 101 04/08/2015   CREATININE 0.79 04/08/2015   BUN 13 04/08/2015   CO2 28 04/08/2015   TSH 2.46 04/08/2015       Assessment & Plan:   Problem List Items Addressed This Visit    Diarrhea    Has resolved.  On probiotics.  Continue.  Follow.        Interstitial cystitis - Primary    Seeing Dr Jacqlyn Larsen.  No urinary symptoms currently.  Follow.       Stress    Increased stress as outlined.  Discussed with her today.  Will start prozac as directed.  Follow.       Vaginal pain    Feels some discomfort between the vaginal area and the rectal area.  No significant pain.  Exam unrevealing.  No pain on exam.  Discussed further evaluation.  She wants to monitor.  Follow.            Einar Pheasant, MD

## 2015-09-13 NOTE — Progress Notes (Signed)
Pre-visit discussion using our clinic review tool. No additional management support is needed unless otherwise documented below in the visit note.  

## 2015-09-18 ENCOUNTER — Encounter: Payer: Self-pay | Admitting: Internal Medicine

## 2015-09-18 DIAGNOSIS — R102 Pelvic and perineal pain: Secondary | ICD-10-CM | POA: Insufficient documentation

## 2015-09-18 DIAGNOSIS — R197 Diarrhea, unspecified: Secondary | ICD-10-CM | POA: Insufficient documentation

## 2015-09-18 NOTE — Assessment & Plan Note (Signed)
Has resolved.  On probiotics.  Continue.  Follow.

## 2015-09-18 NOTE — Assessment & Plan Note (Signed)
Feels some discomfort between the vaginal area and the rectal area.  No significant pain.  Exam unrevealing.  No pain on exam.  Discussed further evaluation.  She wants to monitor.  Follow.

## 2015-09-18 NOTE — Assessment & Plan Note (Signed)
Seeing Dr Jacqlyn Larsen.  No urinary symptoms currently.  Follow.

## 2015-09-18 NOTE — Assessment & Plan Note (Signed)
Increased stress as outlined.  Discussed with her today.  Will start prozac as directed.  Follow.

## 2015-10-14 ENCOUNTER — Ambulatory Visit (INDEPENDENT_AMBULATORY_CARE_PROVIDER_SITE_OTHER): Payer: Federal, State, Local not specified - PPO | Admitting: Internal Medicine

## 2015-10-14 ENCOUNTER — Telehealth: Payer: Self-pay | Admitting: *Deleted

## 2015-10-14 ENCOUNTER — Encounter: Payer: Self-pay | Admitting: Internal Medicine

## 2015-10-14 VITALS — BP 142/90 | HR 117 | Temp 98.5°F | Resp 18 | Ht 66.25 in | Wt 151.2 lb

## 2015-10-14 DIAGNOSIS — R102 Pelvic and perineal pain: Secondary | ICD-10-CM | POA: Diagnosis not present

## 2015-10-14 DIAGNOSIS — E7800 Pure hypercholesterolemia, unspecified: Secondary | ICD-10-CM | POA: Diagnosis not present

## 2015-10-14 DIAGNOSIS — IMO0001 Reserved for inherently not codable concepts without codable children: Secondary | ICD-10-CM

## 2015-10-14 DIAGNOSIS — R197 Diarrhea, unspecified: Secondary | ICD-10-CM

## 2015-10-14 DIAGNOSIS — R030 Elevated blood-pressure reading, without diagnosis of hypertension: Secondary | ICD-10-CM

## 2015-10-14 DIAGNOSIS — Z800 Family history of malignant neoplasm of digestive organs: Secondary | ICD-10-CM

## 2015-10-14 DIAGNOSIS — Z658 Other specified problems related to psychosocial circumstances: Secondary | ICD-10-CM

## 2015-10-14 DIAGNOSIS — F439 Reaction to severe stress, unspecified: Secondary | ICD-10-CM

## 2015-10-14 MED ORDER — CITALOPRAM HYDROBROMIDE 10 MG PO TABS: 10.0000 mg | ORAL_TABLET | Freq: Every day | ORAL | Status: DC

## 2015-10-14 MED ORDER — ALPRAZOLAM 0.25 MG PO TABS: 0.2500 mg | ORAL_TABLET | Freq: Every day | ORAL | Status: DC | PRN

## 2015-10-14 NOTE — Telephone Encounter (Signed)
Patient was advised to seen back in six weeks,which would have put her visit dates around 12/16. There are no times to place patient on the schedule, please advise.  Patient requested to be seen with in the six week time frame due to medications.

## 2015-10-14 NOTE — Telephone Encounter (Signed)
Can schedule her for 4:00 on 11/29/15.  Thanks

## 2015-10-14 NOTE — Progress Notes (Signed)
Patient ID: Kelly Tanner, female   DOB: 04-29-1961, 54 y.o.   MRN: [id number]   Subjective:    Patient ID: Kelly Tanner, female    DOB: [birth year], 54 y.o.   MRN: [id number]  HPI  Patient with past history of hypercholesterolemia and anxiety.  She comes in today to follow up on these issues.  I had started her on prozac last visit.  She does not feel this is helping.  Feels a little more anxious.  Some increased stress with her daughter's issues.  Daughter is better.  Still with anxiety.  No increased depression.  Tries to stay active.  Reports no cardiac symptoms with increased activity or exertion.  No sob.  No acid reflux.  Still with some occasional loose stool.  Only flares at times.  Was questioning if stress could trigger.  No diarrhea like she was having previously.  Had normal bowel movement recently.  A little straining yesterday.  Noticed a speck of blood on tissue.  No other bleeding.  Normal bowel movement today.  No vaginal discharge.     Past Medical History  Diagnosis Date  . Hyperlipidemia   . Basal cell carcinoma    Past Surgical History  Procedure Laterality Date  . Cyst removal neck    . Refractive surgery    . Skin cancer excision  2001    basal cell   Family History  Problem Relation Age of Onset  . Lung cancer      parent  . Colon cancer      parent   Social History   Social History  . Marital Status: Married    Spouse Name: N/A  . Number of Children: 2  . Years of Education: N/A   Social History Main Topics  . Smoking status: Never Smoker   . Smokeless tobacco: Never Used  . Alcohol Use: 0.0 oz/week    0 Standard drinks or equivalent per week  . Drug Use: No  . Sexual Activity: Not Asked   Other Topics Concern  . None   Social History Narrative    Outpatient Encounter Prescriptions as of 10/14/2015  Medication Sig  . ALPRAZolam (XANAX) 0.25 MG tablet Take 1 tablet (0.25 mg total) by mouth daily as needed.  . diphenhydrAMINE (BENADRYL)  25 MG tablet Take 25 mg by mouth as needed for itching.  . estradiol (ESTRACE) 0.1 MG/GM vaginal cream Place 2 g vaginally daily.  . Ibuprofen-Diphenhydramine Cit (ADVIL PM PO) Take by mouth as needed.  . [DISCONTINUED] ALPRAZolam (XANAX) 0.25 MG tablet Take 1 tablet (0.25 mg total) by mouth daily as needed.  . [DISCONTINUED] FLUoxetine (PROZAC) 10 MG capsule Take 1 capsule (10 mg total) by mouth daily.  . citalopram (CELEXA) 10 MG tablet Take 1 tablet (10 mg total) by mouth daily.   No facility-administered encounter medications on file as of 10/14/2015.    Review of Systems  Constitutional: Negative for appetite change and unexpected weight change.  HENT: Negative for congestion and sinus pressure.   Respiratory: Negative for cough, chest tightness and shortness of breath.   Cardiovascular: Negative for chest pain, palpitations and leg swelling.  Gastrointestinal: Positive for diarrhea (intermittent loose stool as outlined.  ). Negative for nausea, vomiting and abdominal pain.  Genitourinary: Negative for dysuria and difficulty urinating.  Musculoskeletal: Negative for back pain and joint swelling.  Skin: Negative for color change and rash.  Neurological: Negative for dizziness, light-headedness and headaches.  Psychiatric/Behavioral: Negative for dysphoric mood and  agitation.       Increased stress and anxiety as outlined.  Does not feel prozac is working.         Objective:     Blood pressure and pulse rechecked by me:  130/84 and 92  Physical Exam  Constitutional: She appears well-developed and well-nourished. No distress.  HENT:  Nose: Nose normal.  Mouth/Throat: Oropharynx is clear and moist.  Eyes: Conjunctivae are normal. Right eye exhibits no discharge. Left eye exhibits no discharge.  Neck: Neck supple. No thyromegaly present.  Cardiovascular: Normal rate and regular rhythm.   Pulmonary/Chest: Breath sounds normal. No respiratory distress. She has no wheezes.    Abdominal: Soft. Bowel sounds are normal. There is no tenderness.  Musculoskeletal: She exhibits no edema or tenderness.  Lymphadenopathy:    She has no cervical adenopathy.  Skin: No rash noted. No erythema.  Psychiatric: She has a normal mood and affect. Her behavior is normal.    BP 142/90 mmHg  Pulse 117  Temp(Src) 98.5 F (36.9 C) (Oral)  Resp 18  Ht 5' 6.25" (1.683 m)  Wt 151 lb 4 oz (68.607 kg)  BMI 24.22 kg/m2  SpO2 98%  LMP 11/02/2010 Wt Readings from Last 3 Encounters:  10/14/15 151 lb 4 oz (68.607 kg)  09/13/15 151 lb 8 oz (68.72 kg)  04/12/15 150 lb 2 oz (68.096 kg)     Lab Results  Component Value Date   WBC 9.3 04/28/2015   HGB 12.3 04/28/2015   HCT 36.4 04/28/2015   PLT 281.0 04/28/2015   GLUCOSE 93 04/08/2015   CHOL 161 04/08/2015   TRIG 153.0* 04/08/2015   HDL 31.10* 04/08/2015   LDLCALC 99 04/08/2015   ALT 17 04/08/2015   AST 17 04/08/2015   NA 136 04/08/2015   K 4.5 04/08/2015   CL 101 04/08/2015   CREATININE 0.79 04/08/2015   BUN 13 04/08/2015   CO2 28 04/08/2015   TSH 2.46 04/08/2015       Assessment & Plan:   Problem List Items Addressed This Visit    Diarrhea    Overall better.  Continue the probiotic.  Treat anxiety.  Follow.  Colonoscopy as outlined.        Elevated blood pressure    Recheck blood pressure today - improved.  Follow.        Family history of colon cancer    Colonoscopy 09/2012.  Recommended f/u in five years.       Hypercholesterolemia    Low cholesterol diet and exercise.  Follow lipid panel.        Stress    Increased stress as outlined.  Increased anxiety.  Will taper off prozac.  Start citalopram 10mg  q day.  Follow closely.  Adjust medication as tolerated.  F/u soon for reassessment.        Vaginal pain - Primary    Is better.  Follow.  Notify me if persistent.            Einar Pheasant, MD

## 2015-10-14 NOTE — Progress Notes (Signed)
Pre-visit discussion using our clinic review tool. No additional management support is needed unless otherwise documented below in the visit note.  

## 2015-10-16 ENCOUNTER — Encounter: Payer: Self-pay | Admitting: Internal Medicine

## 2015-10-16 NOTE — Assessment & Plan Note (Signed)
Is better.  Follow.  Notify me if persistent.

## 2015-10-16 NOTE — Assessment & Plan Note (Signed)
Low cholesterol diet and exercise.  Follow lipid panel.   

## 2015-10-16 NOTE — Assessment & Plan Note (Signed)
Overall better.  Continue the probiotic.  Treat anxiety.  Follow.  Colonoscopy as outlined.

## 2015-10-16 NOTE — Assessment & Plan Note (Signed)
Colonoscopy 09/2012.  Recommended f/u in five years.  

## 2015-10-16 NOTE — Assessment & Plan Note (Signed)
Recheck blood pressure today - improved.  Follow.

## 2015-10-16 NOTE — Assessment & Plan Note (Signed)
Increased stress as outlined.  Increased anxiety.  Will taper off prozac.  Start citalopram 10mg  q day.  Follow closely.  Adjust medication as tolerated.  F/u soon for reassessment.

## 2015-11-29 ENCOUNTER — Encounter: Payer: Self-pay | Admitting: Internal Medicine

## 2015-11-29 ENCOUNTER — Ambulatory Visit (INDEPENDENT_AMBULATORY_CARE_PROVIDER_SITE_OTHER): Payer: Federal, State, Local not specified - PPO | Admitting: Internal Medicine

## 2015-11-29 VITALS — BP 112/74 | HR 98 | Temp 98.2°F | Ht 66.25 in | Wt 154.8 lb

## 2015-11-29 DIAGNOSIS — N3010 Interstitial cystitis (chronic) without hematuria: Secondary | ICD-10-CM | POA: Diagnosis not present

## 2015-11-29 DIAGNOSIS — IMO0001 Reserved for inherently not codable concepts without codable children: Secondary | ICD-10-CM

## 2015-11-29 DIAGNOSIS — R030 Elevated blood-pressure reading, without diagnosis of hypertension: Secondary | ICD-10-CM

## 2015-11-29 DIAGNOSIS — E7800 Pure hypercholesterolemia, unspecified: Secondary | ICD-10-CM

## 2015-11-29 DIAGNOSIS — R102 Pelvic and perineal pain: Secondary | ICD-10-CM

## 2015-11-29 DIAGNOSIS — D649 Anemia, unspecified: Secondary | ICD-10-CM

## 2015-11-29 DIAGNOSIS — Z658 Other specified problems related to psychosocial circumstances: Secondary | ICD-10-CM

## 2015-11-29 DIAGNOSIS — F439 Reaction to severe stress, unspecified: Secondary | ICD-10-CM

## 2015-11-29 DIAGNOSIS — R197 Diarrhea, unspecified: Secondary | ICD-10-CM

## 2015-11-29 DIAGNOSIS — Z23 Encounter for immunization: Secondary | ICD-10-CM | POA: Diagnosis not present

## 2015-11-29 MED ORDER — CITALOPRAM HYDROBROMIDE 10 MG PO TABS: 10.0000 mg | ORAL_TABLET | Freq: Every day | ORAL | Status: DC

## 2015-11-29 MED ORDER — ALPRAZOLAM 0.25 MG PO TABS: 0.2500 mg | ORAL_TABLET | Freq: Every day | ORAL | Status: DC | PRN

## 2015-11-29 NOTE — Progress Notes (Signed)
Patient ID: Kelly Tanner, female   DOB: Mar 17, 1961, 54 y.o.   MRN: [id number]   Subjective:    Patient ID: Kelly Tanner, female    DOB: 1961/11/14, 54 y.o.   MRN: [id number]  HPI  Patient with past history of hypercholesterolemia and increased stress and anxiety.  She comes in today for a scheduled follow up.  Last visit, we started her on citalopram.  She feels this is working for her.  Feels like things are more leveled off.  Does not feel as anxious.  Tries to stay active.  No cardiac symptoms with increased activity or exertion.  No sob.  No abdominal pain or cramping.  No vaginal pressure.  Bowels are better.  May occasionally have a loose stool, but this is not on a regular basis.  Overall feels better.  Discussed need for flu shot.     Past Medical History  Diagnosis Date  . Hyperlipidemia   . Basal cell carcinoma    Past Surgical History  Procedure Laterality Date  . Cyst removal neck    . Refractive surgery    . Skin cancer excision  2001    basal cell   Family History  Problem Relation Age of Onset  . Lung cancer      parent  . Colon cancer      parent   Social History   Social History  . Marital Status: Married    Spouse Name: N/A  . Number of Children: 2  . Years of Education: N/A   Social History Main Topics  . Smoking status: Never Smoker   . Smokeless tobacco: Never Used  . Alcohol Use: 0.0 oz/week    0 Standard drinks or equivalent per week  . Drug Use: No  . Sexual Activity: Not Asked   Other Topics Concern  . None   Social History Narrative    Outpatient Encounter Prescriptions as of 11/29/2015  Medication Sig  . ALPRAZolam (XANAX) 0.25 MG tablet Take 1 tablet (0.25 mg total) by mouth daily as needed.  . citalopram (CELEXA) 10 MG tablet Take 1 tablet (10 mg total) by mouth daily.  . diphenhydrAMINE (BENADRYL) 25 MG tablet Take 25 mg by mouth as needed for itching.  . estradiol (ESTRACE) 0.1 MG/GM vaginal cream Place 2 g vaginally daily.    . Ibuprofen-Diphenhydramine Cit (ADVIL PM PO) Take by mouth as needed.  . [DISCONTINUED] ALPRAZolam (XANAX) 0.25 MG tablet Take 1 tablet (0.25 mg total) by mouth daily as needed.  . [DISCONTINUED] citalopram (CELEXA) 10 MG tablet Take 1 tablet (10 mg total) by mouth daily.   No facility-administered encounter medications on file as of 11/29/2015.    Review of Systems  Constitutional: Negative for appetite change and unexpected weight change.  HENT: Negative for congestion and sinus pressure.   Eyes: Negative for discharge and redness.  Respiratory: Negative for cough, chest tightness and shortness of breath.   Cardiovascular: Negative for chest pain, palpitations and leg swelling.  Gastrointestinal: Negative for nausea, vomiting and abdominal pain.       Bowels doing better.  No increased diarrhea.    Genitourinary: Negative for dysuria and difficulty urinating.  Musculoskeletal: Negative for back pain and joint swelling.  Skin: Negative for color change and rash.  Neurological: Negative for dizziness, light-headedness and headaches.  Psychiatric/Behavioral: Negative for dysphoric mood and agitation.       Objective:     Pulse rechecked by me:  84  Physical Exam  Constitutional:  She appears well-developed and well-nourished. No distress.  HENT:  Nose: Nose normal.  Mouth/Throat: Oropharynx is clear and moist.  Eyes: Conjunctivae are normal. Right eye exhibits no discharge. Left eye exhibits no discharge.  Neck: Neck supple. No thyromegaly present.  Cardiovascular: Normal rate and regular rhythm.   Pulmonary/Chest: Breath sounds normal. No respiratory distress. She has no wheezes.  Abdominal: Soft. Bowel sounds are normal. There is no tenderness.  Musculoskeletal: She exhibits no edema or tenderness.  Lymphadenopathy:    She has no cervical adenopathy.  Skin: No rash noted. No erythema.  Psychiatric: She has a normal mood and affect. Her behavior is normal.    BP 112/74  mmHg  Pulse 98  Temp(Src) 98.2 F (36.8 C) (Oral)  Ht 5' 6.25" (1.683 m)  Wt 154 lb 12.8 oz (70.217 kg)  BMI 24.79 kg/m2  SpO2 99%  LMP 11/02/2010 Wt Readings from Last 3 Encounters:  11/29/15 154 lb 12.8 oz (70.217 kg)  10/14/15 151 lb 4 oz (68.607 kg)  09/13/15 151 lb 8 oz (68.72 kg)     Lab Results  Component Value Date   WBC 9.3 04/28/2015   HGB 12.3 04/28/2015   HCT 36.4 04/28/2015   PLT 281.0 04/28/2015   GLUCOSE 93 04/08/2015   CHOL 161 04/08/2015   TRIG 153.0* 04/08/2015   HDL 31.10* 04/08/2015   LDLCALC 99 04/08/2015   ALT 17 04/08/2015   AST 17 04/08/2015   NA 136 04/08/2015   K 4.5 04/08/2015   CL 101 04/08/2015   CREATININE 0.79 04/08/2015   BUN 13 04/08/2015   CO2 28 04/08/2015   TSH 2.46 04/08/2015       Assessment & Plan:   Problem List Items Addressed This Visit    Anemia    Hemoglobin 04/28/15 - wnl.       Diarrhea    Diarrhea better.  Doing well.  Eating well.  Follow.        Elevated blood pressure    Blood pressure doing better.  Follow.        Hypercholesterolemia    Low cholesterol diet and exercise.  Follow lipid panel.        Interstitial cystitis    Followed by Dr Jacqlyn Larsen.   Doing well.        Stress    She feels she is handling things better.  Decreased anxiety.  On citalopram.  It appears this dose is working well for her.  Follow.        Vaginal pain    Not an issue now.         Other Visit Diagnoses    Encounter for immunization    -  Primary        Einar Pheasant, MD

## 2015-11-29 NOTE — Progress Notes (Signed)
Pre visit review using our clinic review tool, if applicable. No additional management support is needed unless otherwise documented below in the visit note. 

## 2015-11-30 ENCOUNTER — Encounter: Payer: Self-pay | Admitting: Internal Medicine

## 2015-11-30 NOTE — Assessment & Plan Note (Signed)
Diarrhea better.  Doing well.  Eating well.  Follow.

## 2015-11-30 NOTE — Assessment & Plan Note (Signed)
She feels she is handling things better.  Decreased anxiety.  On citalopram.  It appears this dose is working well for her.  Follow.

## 2015-11-30 NOTE — Assessment & Plan Note (Signed)
Low cholesterol diet and exercise.  Follow lipid panel.   

## 2015-11-30 NOTE — Assessment & Plan Note (Signed)
Not an issue now

## 2015-11-30 NOTE — Assessment & Plan Note (Signed)
Blood pressure doing better.  Follow.   

## 2015-11-30 NOTE — Assessment & Plan Note (Signed)
Hemoglobin 04/28/15 - wnl.

## 2015-11-30 NOTE — Assessment & Plan Note (Signed)
Followed by Dr Cope.  Doing well.   

## 2015-12-07 ENCOUNTER — Encounter: Payer: Self-pay | Admitting: Internal Medicine

## 2015-12-07 NOTE — Telephone Encounter (Signed)
I would normally work her in.  If she is needing to be seen, let her know that I am not here the rest of the day and out the rest of the week.  Thanks

## 2016-01-21 DIAGNOSIS — R339 Retention of urine, unspecified: Secondary | ICD-10-CM | POA: Insufficient documentation

## 2016-02-09 ENCOUNTER — Encounter: Payer: Self-pay | Admitting: Internal Medicine

## 2016-02-09 ENCOUNTER — Ambulatory Visit (INDEPENDENT_AMBULATORY_CARE_PROVIDER_SITE_OTHER): Payer: Federal, State, Local not specified - PPO | Admitting: Internal Medicine

## 2016-02-09 VITALS — BP 130/80 | HR 100 | Temp 98.7°F | Resp 18 | Ht 66.25 in | Wt 156.2 lb

## 2016-02-09 DIAGNOSIS — N3010 Interstitial cystitis (chronic) without hematuria: Secondary | ICD-10-CM | POA: Diagnosis not present

## 2016-02-09 DIAGNOSIS — R030 Elevated blood-pressure reading, without diagnosis of hypertension: Secondary | ICD-10-CM

## 2016-02-09 DIAGNOSIS — Z658 Other specified problems related to psychosocial circumstances: Secondary | ICD-10-CM | POA: Diagnosis not present

## 2016-02-09 DIAGNOSIS — E7800 Pure hypercholesterolemia, unspecified: Secondary | ICD-10-CM

## 2016-02-09 DIAGNOSIS — IMO0001 Reserved for inherently not codable concepts without codable children: Secondary | ICD-10-CM

## 2016-02-09 DIAGNOSIS — F439 Reaction to severe stress, unspecified: Secondary | ICD-10-CM

## 2016-02-09 DIAGNOSIS — R197 Diarrhea, unspecified: Secondary | ICD-10-CM

## 2016-02-09 DIAGNOSIS — Z862 Personal history of diseases of the blood and blood-forming organs and certain disorders involving the immune mechanism: Secondary | ICD-10-CM

## 2016-02-09 NOTE — Progress Notes (Signed)
Pre-visit discussion using our clinic review tool. No additional management support is needed unless otherwise documented below in the visit note.  

## 2016-02-12 ENCOUNTER — Encounter: Payer: Self-pay | Admitting: Internal Medicine

## 2016-02-12 NOTE — Assessment & Plan Note (Signed)
On citalopram.  Feels better on the medication.  Not able to cry as easily.  Discussed with her today.  Wants to remain on current medication regimen.  Follow.  Get her back in soon to reassess.

## 2016-02-12 NOTE — Progress Notes (Signed)
Patient ID: Kelly Tanner, female   DOB: August 10, 1961, 55 y.o.   MRN: [id number]   Subjective:    Patient ID: Kelly Tanner, female    DOB: 10-09-1961, 55 y.o.   MRN: [id number]  HPI  Patient with past history of interstitial cystitis followed by Dr Jacqlyn Larsen and hypercholesterolemia.  She comes in today for a scheduled follow up.  She is on citalopram.  This is helping.  States she cannot cry.  Still feels emotions, but just not as easy to cry.  Overall feels better.  Bowels are better.  She tries to stay active.  No cardiac symptoms with increased activity or exertion.  Breathing stable.  No abdominal pain or cramping.     Past Medical History  Diagnosis Date  . Hyperlipidemia   . Basal cell carcinoma    Past Surgical History  Procedure Laterality Date  . Cyst removal neck    . Refractive surgery    . Skin cancer excision  2001    basal cell   Family History  Problem Relation Age of Onset  . Lung cancer      parent  . Colon cancer      parent   Social History   Social History  . Marital Status: Married    Spouse Name: N/A  . Number of Children: 2  . Years of Education: N/A   Social History Main Topics  . Smoking status: Never Smoker   . Smokeless tobacco: Never Used  . Alcohol Use: 0.0 oz/week    0 Standard drinks or equivalent per week  . Drug Use: No  . Sexual Activity: Not Asked   Other Topics Concern  . None   Social History Narrative    Outpatient Encounter Prescriptions as of 02/09/2016  Medication Sig  . ALPRAZolam (XANAX) 0.25 MG tablet Take 1 tablet (0.25 mg total) by mouth daily as needed.  . citalopram (CELEXA) 10 MG tablet Take 1 tablet (10 mg total) by mouth daily.  . diphenhydrAMINE (BENADRYL) 25 MG tablet Take 25 mg by mouth as needed for itching.  . estradiol (ESTRACE) 0.1 MG/GM vaginal cream Place 2 g vaginally daily.  . Ibuprofen-Diphenhydramine Cit (ADVIL PM PO) Take by mouth as needed.   No facility-administered encounter medications on file  as of 02/09/2016.    Review of Systems  Constitutional: Negative for appetite change and unexpected weight change.  HENT: Negative for congestion and sinus pressure.   Respiratory: Negative for cough, chest tightness and shortness of breath.   Cardiovascular: Negative for chest pain, palpitations and leg swelling.  Gastrointestinal: Negative for nausea, vomiting and abdominal pain.       Bowels are better.    Genitourinary: Negative for dysuria and difficulty urinating.  Musculoskeletal: Negative for back pain and joint swelling.  Skin: Negative for color change and rash.  Neurological: Negative for dizziness, light-headedness and headaches.  Psychiatric/Behavioral: Negative for dysphoric mood and agitation.       Citalopram helping.  Increased stress with her daughter's health issues.         Objective:    Physical Exam  Constitutional: She appears well-developed and well-nourished. No distress.  HENT:  Nose: Nose normal.  Mouth/Throat: Oropharynx is clear and moist.  Eyes: Conjunctivae are normal. Right eye exhibits no discharge. Left eye exhibits no discharge.  Neck: Neck supple. No thyromegaly present.  Cardiovascular: Normal rate and regular rhythm.   Pulmonary/Chest: Breath sounds normal. No respiratory distress. She has no wheezes.  Abdominal: Soft.  Bowel sounds are normal. There is no tenderness.  Musculoskeletal: She exhibits no edema or tenderness.  Lymphadenopathy:    She has no cervical adenopathy.  Skin: No rash noted. No erythema.  Psychiatric: She has a normal mood and affect. Her behavior is normal.    BP 130/80 mmHg  Pulse 100  Temp(Src) 98.7 F (37.1 C) (Oral)  Resp 18  Ht 5' 6.25" (1.683 m)  Wt 156 lb 4 oz (70.875 kg)  BMI 25.02 kg/m2  SpO2 98%  LMP 11/02/2010 Wt Readings from Last 3 Encounters:  02/09/16 156 lb 4 oz (70.875 kg)  11/29/15 154 lb 12.8 oz (70.217 kg)  10/14/15 151 lb 4 oz (68.607 kg)     Lab Results  Component Value Date   WBC  9.3 04/28/2015   HGB 12.3 04/28/2015   HCT 36.4 04/28/2015   PLT 281.0 04/28/2015   GLUCOSE 93 04/08/2015   CHOL 161 04/08/2015   TRIG 153.0* 04/08/2015   HDL 31.10* 04/08/2015   LDLCALC 99 04/08/2015   ALT 17 04/08/2015   AST 17 04/08/2015   NA 136 04/08/2015   K 4.5 04/08/2015   CL 101 04/08/2015   CREATININE 0.79 04/08/2015   BUN 13 04/08/2015   CO2 28 04/08/2015   TSH 2.46 04/08/2015       Assessment & Plan:   Problem List Items Addressed This Visit    Diarrhea    Bowels better.  Eating well.  No pain.  Follow.       Elevated blood pressure    Blood pressure doing better with treating anxiety.  Follow.        Hypercholesterolemia    Low cholesterol diet and exercise.  Follow lipid panel.   Lab Results  Component Value Date   CHOL 161 04/08/2015   HDL 31.10* 04/08/2015   LDLCALC 99 04/08/2015   TRIG 153.0* 04/08/2015   CHOLHDL 5 04/08/2015        Relevant Orders   Comprehensive metabolic panel   Lipid panel   Interstitial cystitis - Primary    Just evaluated by Dr Jacqlyn Larsen.  Followed by Dr Jacqlyn Larsen.  Stable.        Stress    On citalopram.  Feels better on the medication.  Not able to cry as easily.  Discussed with her today.  Wants to remain on current medication regimen.  Follow.  Get her back in soon to reassess.         Other Visit Diagnoses    History of anemia        Relevant Orders    CBC with Differential/Platelet    TSH    Vitamin B12        Einar Pheasant, MD

## 2016-02-12 NOTE — Assessment & Plan Note (Signed)
Just evaluated by Dr Jacqlyn Larsen.  Followed by Dr Jacqlyn Larsen.  Stable.

## 2016-02-12 NOTE — Assessment & Plan Note (Signed)
Bowels better.  Eating well.  No pain.  Follow.

## 2016-02-12 NOTE — Assessment & Plan Note (Signed)
Low cholesterol diet and exercise.  Follow lipid panel.   Lab Results  Component Value Date   CHOL 161 04/08/2015   HDL 31.10* 04/08/2015   LDLCALC 99 04/08/2015   TRIG 153.0* 04/08/2015   CHOLHDL 5 04/08/2015

## 2016-02-12 NOTE — Assessment & Plan Note (Signed)
Blood pressure doing better with treating anxiety.  Follow.

## 2016-03-20 DIAGNOSIS — Z85828 Personal history of other malignant neoplasm of skin: Secondary | ICD-10-CM | POA: Diagnosis not present

## 2016-04-04 ENCOUNTER — Other Ambulatory Visit: Payer: Self-pay | Admitting: Internal Medicine

## 2016-04-04 NOTE — Telephone Encounter (Signed)
Okay to refill? Last refilled on 11/29/15 #30 with 2 refills

## 2016-04-05 DIAGNOSIS — K080 Exfoliation of teeth due to systemic causes: Secondary | ICD-10-CM | POA: Diagnosis not present

## 2016-04-05 NOTE — Telephone Encounter (Signed)
ok'd refill for citalopram #30 with 2 refills.

## 2016-05-03 ENCOUNTER — Other Ambulatory Visit (INDEPENDENT_AMBULATORY_CARE_PROVIDER_SITE_OTHER): Payer: Federal, State, Local not specified - PPO

## 2016-05-03 DIAGNOSIS — E7800 Pure hypercholesterolemia, unspecified: Secondary | ICD-10-CM | POA: Diagnosis not present

## 2016-05-03 DIAGNOSIS — R7989 Other specified abnormal findings of blood chemistry: Secondary | ICD-10-CM | POA: Diagnosis not present

## 2016-05-03 DIAGNOSIS — Z862 Personal history of diseases of the blood and blood-forming organs and certain disorders involving the immune mechanism: Secondary | ICD-10-CM | POA: Diagnosis not present

## 2016-05-03 LAB — CBC WITH DIFFERENTIAL/PLATELET
Basophils Absolute: 0 10*3/uL (ref 0.0–0.1)
Basophils Relative: 0.2 % (ref 0.0–3.0)
EOS ABS: 0.1 10*3/uL (ref 0.0–0.7)
EOS PCT: 1.6 % (ref 0.0–5.0)
HCT: 35.7 % — ABNORMAL LOW (ref 36.0–46.0)
Hemoglobin: 12.1 g/dL (ref 12.0–15.0)
LYMPHS ABS: 1.7 10*3/uL (ref 0.7–4.0)
Lymphocytes Relative: 22.6 % (ref 12.0–46.0)
MCHC: 33.8 g/dL (ref 30.0–36.0)
MCV: 86.3 fl (ref 78.0–100.0)
MONO ABS: 0.4 10*3/uL (ref 0.1–1.0)
Monocytes Relative: 5.7 % (ref 3.0–12.0)
NEUTROS PCT: 69.9 % (ref 43.0–77.0)
Neutro Abs: 5.4 10*3/uL (ref 1.4–7.7)
Platelets: 263 10*3/uL (ref 150.0–400.0)
RBC: 4.14 Mil/uL (ref 3.87–5.11)
RDW: 14 % (ref 11.5–15.5)
WBC: 7.7 10*3/uL (ref 4.0–10.5)

## 2016-05-03 LAB — COMPREHENSIVE METABOLIC PANEL
ALK PHOS: 101 U/L (ref 39–117)
ALT: 16 U/L (ref 0–35)
AST: 17 U/L (ref 0–37)
Albumin: 4.5 g/dL (ref 3.5–5.2)
BUN: 12 mg/dL (ref 6–23)
CHLORIDE: 104 meq/L (ref 96–112)
CO2: 28 mEq/L (ref 19–32)
Calcium: 9.9 mg/dL (ref 8.4–10.5)
Creatinine, Ser: 0.72 mg/dL (ref 0.40–1.20)
GFR: 89.29 mL/min (ref >60.00)
GLUCOSE: 92 mg/dL (ref 70–99)
POTASSIUM: 3.9 meq/L (ref 3.5–5.1)
SODIUM: 138 meq/L (ref 135–145)
TOTAL PROTEIN: 7.9 g/dL (ref 6.0–8.3)
Total Bilirubin: 0.4 mg/dL (ref 0.2–1.2)

## 2016-05-03 LAB — LIPID PANEL
Cholesterol: 172 mg/dL (ref 0–200)
HDL: 27.8 mg/dL — AB (ref >39.00)
NONHDL: 143.77
Total CHOL/HDL Ratio: 6
Triglycerides: 334 mg/dL — ABNORMAL HIGH (ref 0.0–149.0)
VLDL: 66.8 mg/dL — AB (ref 0.0–40.0)

## 2016-05-03 LAB — TSH: TSH: 2.2 u[IU]/mL (ref 0.35–4.50)

## 2016-05-03 LAB — VITAMIN B12: VITAMIN B 12: 670 pg/mL (ref 211–911)

## 2016-05-03 LAB — LDL CHOLESTEROL, DIRECT: Direct LDL: 72 mg/dL

## 2016-05-07 ENCOUNTER — Encounter: Payer: Self-pay | Admitting: Internal Medicine

## 2016-05-08 ENCOUNTER — Ambulatory Visit (INDEPENDENT_AMBULATORY_CARE_PROVIDER_SITE_OTHER): Payer: Federal, State, Local not specified - PPO | Admitting: Internal Medicine

## 2016-05-08 ENCOUNTER — Encounter: Payer: Self-pay | Admitting: Internal Medicine

## 2016-05-08 VITALS — BP 120/70 | HR 86 | Temp 98.5°F | Resp 18 | Ht 66.0 in | Wt 155.4 lb

## 2016-05-08 DIAGNOSIS — F439 Reaction to severe stress, unspecified: Secondary | ICD-10-CM

## 2016-05-08 DIAGNOSIS — R197 Diarrhea, unspecified: Secondary | ICD-10-CM | POA: Diagnosis not present

## 2016-05-08 DIAGNOSIS — Z1239 Encounter for other screening for malignant neoplasm of breast: Secondary | ICD-10-CM | POA: Diagnosis not present

## 2016-05-08 DIAGNOSIS — Z800 Family history of malignant neoplasm of digestive organs: Secondary | ICD-10-CM

## 2016-05-08 DIAGNOSIS — E7800 Pure hypercholesterolemia, unspecified: Secondary | ICD-10-CM | POA: Diagnosis not present

## 2016-05-08 DIAGNOSIS — Z658 Other specified problems related to psychosocial circumstances: Secondary | ICD-10-CM

## 2016-05-08 DIAGNOSIS — Z0000 Encounter for general adult medical examination without abnormal findings: Secondary | ICD-10-CM | POA: Diagnosis not present

## 2016-05-08 MED ORDER — ALPRAZOLAM 0.25 MG PO TABS: 0.2500 mg | ORAL_TABLET | Freq: Every day | ORAL | Status: DC | PRN

## 2016-05-08 MED ORDER — TRAZODONE HCL 50 MG PO TABS: 25.0000 mg | ORAL_TABLET | Freq: Every evening | ORAL | Status: DC | PRN

## 2016-05-08 NOTE — Assessment & Plan Note (Signed)
Physical today 05/08/16.  Colonoscopy 09/2012.  Recommended f/u colonoscopy in five years.  Mammogram scheduled.

## 2016-05-08 NOTE — Progress Notes (Signed)
Patient ID: Kelly Tanner, female   DOB: [birth year], 55 y.o.   MRN: [id number]   Subjective:    Patient ID: Kelly Tanner, female    DOB: 07-23-1961, 55 y.o.   MRN: [id number]  HPI  Patient here for her physical exam.  She has been under increased stress recently with her daughter's medical issues.  She has good support.  Not sleeping.  Discussed with her today.  No chest pain.  Tries to stay active.  No sob.  No acid reflux.  No abdominal pain or cramping.  Bowels stable.  Triglycerides are increased.     Past Medical History  Diagnosis Date  . Hyperlipidemia   . Basal cell carcinoma    Past Surgical History  Procedure Laterality Date  . Cyst removal neck    . Refractive surgery    . Skin cancer excision  2001    basal cell   Family History  Problem Relation Age of Onset  . Lung cancer      parent  . Colon cancer      parent   Social History   Social History  . Marital Status: Married    Spouse Name: N/A  . Number of Children: 2  . Years of Education: N/A   Social History Main Topics  . Smoking status: Never Smoker   . Smokeless tobacco: Never Used  . Alcohol Use: 0.0 oz/week    0 Standard drinks or equivalent per week  . Drug Use: No  . Sexual Activity: Not Asked   Other Topics Concern  . None   Social History Narrative    Outpatient Encounter Prescriptions as of 05/08/2016  Medication Sig  . ALPRAZolam (XANAX) 0.25 MG tablet Take 1 tablet (0.25 mg total) by mouth daily as needed.  . diphenhydrAMINE (BENADRYL) 25 MG tablet Take 25 mg by mouth as needed for itching.  . estradiol (ESTRACE) 0.1 MG/GM vaginal cream Place 2 g vaginally daily.  . Ibuprofen-Diphenhydramine Cit (ADVIL PM PO) Take by mouth as needed.  . [DISCONTINUED] ALPRAZolam (XANAX) 0.25 MG tablet Take 1 tablet (0.25 mg total) by mouth daily as needed.  . [DISCONTINUED] citalopram (CELEXA) 10 MG tablet TAKE 1 TABLET ONCE DAILY  . traZODone (DESYREL) 50 MG tablet Take 0.5-1 tablets (25-50 mg  total) by mouth at bedtime as needed for sleep.   No facility-administered encounter medications on file as of 05/08/2016.    Review of Systems  Constitutional: Negative for appetite change and unexpected weight change.  HENT: Negative for congestion and sinus pressure.   Eyes: Negative for pain and visual disturbance.  Respiratory: Negative for cough, chest tightness and shortness of breath.   Cardiovascular: Negative for chest pain, palpitations and leg swelling.  Gastrointestinal: Negative for nausea, vomiting, abdominal pain and diarrhea.  Genitourinary: Negative for dysuria and difficulty urinating.  Musculoskeletal: Negative for back pain and joint swelling.  Skin: Negative for color change and rash.  Neurological: Negative for dizziness, light-headedness and headaches.  Hematological: Negative for adenopathy. Does not bruise/bleed easily.  Psychiatric/Behavioral: Negative for dysphoric mood and agitation.       Objective:    Physical Exam  Constitutional: She is oriented to person, place, and time. She appears well-developed and well-nourished. No distress.  HENT:  Nose: Nose normal.  Mouth/Throat: Oropharynx is clear and moist.  Eyes: Right eye exhibits no discharge. Left eye exhibits no discharge. No scleral icterus.  Neck: Neck supple. No thyromegaly present.  Cardiovascular: Normal rate and regular rhythm.  Pulmonary/Chest: Breath sounds normal. No accessory muscle usage. No tachypnea. No respiratory distress. She has no decreased breath sounds. She has no wheezes. She has no rhonchi. Right breast exhibits no inverted nipple, no mass, no nipple discharge and no tenderness (no axillary adenopathy). Left breast exhibits no inverted nipple, no mass, no nipple discharge and no tenderness (no axilarry adenopathy).  Abdominal: Soft. Bowel sounds are normal. There is no tenderness.  Musculoskeletal: She exhibits no edema or tenderness.  Lymphadenopathy:    She has no cervical  adenopathy.  Neurological: She is alert and oriented to person, place, and time.  Skin: Skin is warm. No rash noted. No erythema.  Psychiatric: She has a normal mood and affect. Her behavior is normal.    BP 120/70 mmHg  Pulse 86  Temp(Src) 98.5 F (36.9 C) (Oral)  Resp 18  Ht 5\' 6"  (1.676 m)  Wt 155 lb 6 oz (70.478 kg)  BMI 25.09 kg/m2  SpO2 100%  LMP 11/02/2010 Wt Readings from Last 3 Encounters:  05/08/16 155 lb 6 oz (70.478 kg)  02/09/16 156 lb 4 oz (70.875 kg)  11/29/15 154 lb 12.8 oz (70.217 kg)     Lab Results  Component Value Date   WBC 7.7 05/03/2016   HGB 12.1 05/03/2016   HCT 35.7* 05/03/2016   PLT 263.0 05/03/2016   GLUCOSE 92 05/03/2016   CHOL 172 05/03/2016   TRIG 334.0* 05/03/2016   HDL 27.80* 05/03/2016   LDLDIRECT 72.0 05/03/2016   LDLCALC 99 04/08/2015   ALT 16 05/03/2016   AST 17 05/03/2016   NA 138 05/03/2016   K 3.9 05/03/2016   CL 104 05/03/2016   CREATININE 0.72 05/03/2016   BUN 12 05/03/2016   CO2 28 05/03/2016   TSH 2.20 05/03/2016       Assessment & Plan:   Problem List Items Addressed This Visit    Diarrhea    Better.  Follow.       Family history of colon cancer    Colonoscopy 09/2012.  Recommended f/u in five years.        Health care maintenance    Physical today 05/08/16.  Colonoscopy 09/2012.  Recommended f/u colonoscopy in five years.  Mammogram scheduled.       Hypercholesterolemia    Discussed recent cholesterol panel.  Triglycerides are elevated.  Low carb diet.  Follow.       Stress    Discussed with her today.  Not sleeping.  Start trazodone.  Follow closely.  Discussed counseling.         Other Visit Diagnoses    Routine general medical examination at a health care facility    -  Primary    Screening breast examination        Relevant Orders    MM DIGITAL SCREENING BILATERAL        Einar Pheasant, MD

## 2016-05-08 NOTE — Progress Notes (Signed)
Pre-visit discussion using our clinic review tool. No additional management support is needed unless otherwise documented below in the visit note.  

## 2016-05-13 ENCOUNTER — Encounter: Payer: Self-pay | Admitting: Internal Medicine

## 2016-05-13 NOTE — Assessment & Plan Note (Signed)
Discussed recent cholesterol panel.  Triglycerides are elevated.  Low carb diet.  Follow.

## 2016-05-13 NOTE — Assessment & Plan Note (Signed)
Better.  Follow.  

## 2016-05-13 NOTE — Assessment & Plan Note (Signed)
Colonoscopy 09/2012.  Recommended f/u in five years.  

## 2016-05-13 NOTE — Assessment & Plan Note (Signed)
Discussed with her today.  Not sleeping.  Start trazodone.  Follow closely.  Discussed counseling.

## 2016-05-29 DIAGNOSIS — H26491 Other secondary cataract, right eye: Secondary | ICD-10-CM | POA: Diagnosis not present

## 2016-06-01 DIAGNOSIS — H33011 Retinal detachment with single break, right eye: Secondary | ICD-10-CM | POA: Diagnosis not present

## 2016-06-01 DIAGNOSIS — H26493 Other secondary cataract, bilateral: Secondary | ICD-10-CM | POA: Diagnosis not present

## 2016-06-01 DIAGNOSIS — H3321 Serous retinal detachment, right eye: Secondary | ICD-10-CM | POA: Diagnosis not present

## 2016-06-05 DIAGNOSIS — H43812 Vitreous degeneration, left eye: Secondary | ICD-10-CM | POA: Diagnosis not present

## 2016-06-05 DIAGNOSIS — R42 Dizziness and giddiness: Secondary | ICD-10-CM | POA: Diagnosis not present

## 2016-06-05 DIAGNOSIS — Z79899 Other long term (current) drug therapy: Secondary | ICD-10-CM | POA: Diagnosis not present

## 2016-06-05 DIAGNOSIS — Z48810 Encounter for surgical aftercare following surgery on the sense organs: Secondary | ICD-10-CM | POA: Diagnosis not present

## 2016-06-05 DIAGNOSIS — H33011 Retinal detachment with single break, right eye: Secondary | ICD-10-CM | POA: Diagnosis not present

## 2016-07-04 DIAGNOSIS — N393 Stress incontinence (female) (male): Secondary | ICD-10-CM | POA: Diagnosis not present

## 2016-07-04 DIAGNOSIS — H33002 Unspecified retinal detachment with retinal break, left eye: Secondary | ICD-10-CM | POA: Diagnosis not present

## 2016-07-04 DIAGNOSIS — Z882 Allergy status to sulfonamides status: Secondary | ICD-10-CM | POA: Diagnosis not present

## 2016-07-04 DIAGNOSIS — Z9842 Cataract extraction status, left eye: Secondary | ICD-10-CM | POA: Diagnosis not present

## 2016-07-04 DIAGNOSIS — N390 Urinary tract infection, site not specified: Secondary | ICD-10-CM | POA: Diagnosis not present

## 2016-07-04 DIAGNOSIS — E785 Hyperlipidemia, unspecified: Secondary | ICD-10-CM | POA: Diagnosis not present

## 2016-07-04 DIAGNOSIS — Z86010 Personal history of colonic polyps: Secondary | ICD-10-CM | POA: Diagnosis not present

## 2016-07-04 DIAGNOSIS — Z85828 Personal history of other malignant neoplasm of skin: Secondary | ICD-10-CM | POA: Diagnosis not present

## 2016-07-04 DIAGNOSIS — D649 Anemia, unspecified: Secondary | ICD-10-CM | POA: Diagnosis not present

## 2016-07-04 DIAGNOSIS — H33021 Retinal detachment with multiple breaks, right eye: Secondary | ICD-10-CM | POA: Diagnosis not present

## 2016-07-04 DIAGNOSIS — Z961 Presence of intraocular lens: Secondary | ICD-10-CM | POA: Diagnosis not present

## 2016-07-04 DIAGNOSIS — Z9841 Cataract extraction status, right eye: Secondary | ICD-10-CM | POA: Diagnosis not present

## 2016-07-04 DIAGNOSIS — F419 Anxiety disorder, unspecified: Secondary | ICD-10-CM | POA: Diagnosis not present

## 2016-07-06 DIAGNOSIS — H33002 Unspecified retinal detachment with retinal break, left eye: Secondary | ICD-10-CM | POA: Insufficient documentation

## 2016-07-20 ENCOUNTER — Ambulatory Visit (INDEPENDENT_AMBULATORY_CARE_PROVIDER_SITE_OTHER): Payer: Federal, State, Local not specified - PPO | Admitting: Internal Medicine

## 2016-07-20 ENCOUNTER — Encounter: Payer: Self-pay | Admitting: Internal Medicine

## 2016-07-20 DIAGNOSIS — N3010 Interstitial cystitis (chronic) without hematuria: Secondary | ICD-10-CM

## 2016-07-20 DIAGNOSIS — E7800 Pure hypercholesterolemia, unspecified: Secondary | ICD-10-CM

## 2016-07-20 DIAGNOSIS — Z658 Other specified problems related to psychosocial circumstances: Secondary | ICD-10-CM

## 2016-07-20 DIAGNOSIS — F439 Reaction to severe stress, unspecified: Secondary | ICD-10-CM

## 2016-07-20 MED ORDER — ALPRAZOLAM 0.25 MG PO TABS: 0.2500 mg | ORAL_TABLET | Freq: Every day | ORAL | 0 refills | Status: DC | PRN

## 2016-07-20 NOTE — Progress Notes (Signed)
Pre-visit discussion using our clinic review tool. No additional management support is needed unless otherwise documented below in the visit note.  

## 2016-07-20 NOTE — Progress Notes (Signed)
Patient ID: Kelly Tanner, female   DOB: February 16, 1961, 55 y.o.   MRN: [id number]   Subjective:    Patient ID: Kelly Tanner, female    DOB: [birth year], 55 y.o.   MRN: [id number]  HPI  Patient here for a scheduled follow up.  She feels she is doing relatively well.  Still with increased stress with her daughter.  Her daughter is pregnant.  She feels she is handling things relatively well.  Tries to stay active.  Has not been able to exercise as much recently.  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  Had surgery on the right eye 06/01/16 and left eye 07/04/16.  Has f/u planned in 4 weeks.  No headache.  No chest pain.  No sob.  Eating and drinking.  Never really took the trazodone.  States had this happen with her eyes and wanted to hold off.  Does take occaional xanax.  Plans to give the trazodone a try once her eye issue is straightened out.     Past Medical History:  Diagnosis Date  . Basal cell carcinoma   . Hyperlipidemia    Past Surgical History:  Procedure Laterality Date  . CYST REMOVAL NECK    . REFRACTIVE SURGERY    . SKIN CANCER EXCISION  2001   basal cell   Family History  Problem Relation Age of Onset  . Lung cancer      parent  . Colon cancer      parent   Social History   Social History  . Marital status: Married    Spouse name: N/A  . Number of children: 2  . Years of education: N/A   Social History Main Topics  . Smoking status: Never Smoker  . Smokeless tobacco: Never Used  . Alcohol use 0.0 oz/week  . Drug use: No  . Sexual activity: Not Asked   Other Topics Concern  . None   Social History Narrative  . None    Outpatient Encounter Prescriptions as of 07/20/2016  Medication Sig  . ALPRAZolam (XANAX) 0.25 MG tablet Take 1 tablet (0.25 mg total) by mouth daily as needed.  Marland Kitchen atropine 1 % ophthalmic solution Place 1 drop into the left eye 2 (two) times daily.  . ciprofloxacin (CILOXAN) 0.3 % ophthalmic solution Place 1 drop into the left eye 4 (four) times  daily.  . diphenhydrAMINE (BENADRYL) 25 MG tablet Take 25 mg by mouth as needed for itching.  . estradiol (ESTRACE) 0.1 MG/GM vaginal cream Place 2 g vaginally daily.  . Ibuprofen-Diphenhydramine Cit (ADVIL PM PO) Take by mouth as needed.  Marland Kitchen ketorolac (ACULAR) 0.5 % ophthalmic solution Place 1 drop into the left eye 2 (two) times daily.  . prednisoLONE acetate (PRED FORTE) 1 % ophthalmic suspension Apply to eye.  . traZODone (DESYREL) 50 MG tablet Take 0.5-1 tablets (25-50 mg total) by mouth at bedtime as needed for sleep.  . [DISCONTINUED] ALPRAZolam (XANAX) 0.25 MG tablet Take 1 tablet (0.25 mg total) by mouth daily as needed.   No facility-administered encounter medications on file as of 07/20/2016.     Review of Systems  Constitutional: Negative for appetite change and unexpected weight change.  HENT: Negative for congestion and sinus pressure.   Respiratory: Negative for cough, chest tightness and shortness of breath.   Cardiovascular: Negative for chest pain, palpitations and leg swelling.  Gastrointestinal: Negative for abdominal pain, diarrhea, nausea and vomiting.  Genitourinary: Negative for dysuria and frequency.  Musculoskeletal: Negative for back  pain and joint swelling.  Neurological: Negative for dizziness, light-headedness and headaches.  Psychiatric/Behavioral: Negative for agitation and dysphoric mood.       Objective:    Physical Exam  Constitutional: She appears well-developed and well-nourished. No distress.  HENT:  Nose: Nose normal.  Mouth/Throat: Oropharynx is clear and moist.  Neck: Neck supple. No thyromegaly present.  Cardiovascular: Normal rate and regular rhythm.   Pulmonary/Chest: Breath sounds normal. No respiratory distress. She has no wheezes.  Abdominal: Soft. Bowel sounds are normal. There is no tenderness.  Musculoskeletal: She exhibits no edema or tenderness.  Lymphadenopathy:    She has no cervical adenopathy.  Skin: No rash noted. No  erythema.  Psychiatric: She has a normal mood and affect. Her behavior is normal.    BP 122/78   Pulse 97   Temp 98.2 F (36.8 C) (Oral)   Resp 18   Ht 5\' 6"  (1.676 m)   Wt 157 lb 4 oz (71.3 kg)   LMP 11/02/2010   SpO2 98%   BMI 25.38 kg/m  Wt Readings from Last 3 Encounters:  07/20/16 157 lb 4 oz (71.3 kg)  05/08/16 155 lb 6 oz (70.5 kg)  02/09/16 156 lb 4 oz (70.9 kg)     Lab Results  Component Value Date   WBC 7.7 05/03/2016   HGB 12.1 05/03/2016   HCT 35.7 (L) 05/03/2016   PLT 263.0 05/03/2016   GLUCOSE 92 05/03/2016   CHOL 172 05/03/2016   TRIG 334.0 (H) 05/03/2016   HDL 27.80 (L) 05/03/2016   LDLDIRECT 72.0 05/03/2016   LDLCALC 99 04/08/2015   ALT 16 05/03/2016   AST 17 05/03/2016   NA 138 05/03/2016   K 3.9 05/03/2016   CL 104 05/03/2016   CREATININE 0.72 05/03/2016   BUN 12 05/03/2016   CO2 28 05/03/2016   TSH 2.20 05/03/2016       Assessment & Plan:   Problem List Items Addressed This Visit    Hypercholesterolemia    Low cholesterol diet and exercise.  Follow lipid panel.        Relevant Orders   Lipid panel   Comprehensive metabolic panel   Interstitial cystitis    Evaluated by Dr Jacqlyn Larsen.        Stress    Increased stress.  Discussed with her today.  Will try trazodone when her eye issues have resolved.  Uses xanax prn.  Follow.         Other Visit Diagnoses   None.      Einar Pheasant, MD

## 2016-07-22 ENCOUNTER — Encounter: Payer: Self-pay | Admitting: Internal Medicine

## 2016-07-22 NOTE — Assessment & Plan Note (Signed)
Increased stress.  Discussed with her today.  Will try trazodone when her eye issues have resolved.  Uses xanax prn.  Follow.

## 2016-07-22 NOTE — Assessment & Plan Note (Signed)
Low cholesterol diet and exercise.  Follow lipid panel.   

## 2016-07-22 NOTE — Assessment & Plan Note (Signed)
Evaluated by Dr Jacqlyn Larsen.

## 2016-07-23 DIAGNOSIS — Z1231 Encounter for screening mammogram for malignant neoplasm of breast: Secondary | ICD-10-CM | POA: Diagnosis not present

## 2016-07-23 LAB — HM MAMMOGRAPHY

## 2016-07-24 ENCOUNTER — Encounter: Payer: Self-pay | Admitting: Internal Medicine

## 2016-08-21 DIAGNOSIS — H33002 Unspecified retinal detachment with retinal break, left eye: Secondary | ICD-10-CM | POA: Diagnosis not present

## 2016-09-20 DIAGNOSIS — Z85828 Personal history of other malignant neoplasm of skin: Secondary | ICD-10-CM | POA: Diagnosis not present

## 2016-10-18 ENCOUNTER — Other Ambulatory Visit: Payer: Federal, State, Local not specified - PPO

## 2016-10-22 ENCOUNTER — Ambulatory Visit: Payer: Federal, State, Local not specified - PPO | Admitting: Internal Medicine

## 2016-10-23 DIAGNOSIS — H33002 Unspecified retinal detachment with retinal break, left eye: Secondary | ICD-10-CM | POA: Diagnosis not present

## 2016-10-29 ENCOUNTER — Other Ambulatory Visit: Payer: Federal, State, Local not specified - PPO

## 2016-11-06 ENCOUNTER — Encounter: Payer: Self-pay | Admitting: Internal Medicine

## 2016-11-06 ENCOUNTER — Ambulatory Visit (INDEPENDENT_AMBULATORY_CARE_PROVIDER_SITE_OTHER): Payer: Federal, State, Local not specified - PPO | Admitting: Internal Medicine

## 2016-11-06 DIAGNOSIS — Z23 Encounter for immunization: Secondary | ICD-10-CM | POA: Diagnosis not present

## 2016-11-06 DIAGNOSIS — F439 Reaction to severe stress, unspecified: Secondary | ICD-10-CM

## 2016-11-06 DIAGNOSIS — R030 Elevated blood-pressure reading, without diagnosis of hypertension: Secondary | ICD-10-CM | POA: Diagnosis not present

## 2016-11-06 DIAGNOSIS — E7800 Pure hypercholesterolemia, unspecified: Secondary | ICD-10-CM

## 2016-11-06 MED ORDER — TRAZODONE HCL 50 MG PO TABS: 25.0000 mg | ORAL_TABLET | Freq: Every evening | ORAL | 1 refills | Status: DC | PRN

## 2016-11-06 MED ORDER — ALPRAZOLAM 0.25 MG PO TABS: 0.2500 mg | ORAL_TABLET | Freq: Every day | ORAL | 0 refills | Status: DC | PRN

## 2016-11-06 NOTE — Progress Notes (Signed)
Patient ID: Kelly Tanner, female   DOB: 05/26/1961, 55 y.o.   MRN: [id number]   Subjective:    Patient ID: Kelly Tanner, female    DOB: 08-28-1961, 55 y.o.   MRN: [id number]  HPI  Patient here for a scheduled follow up.  Still with increased stress.  Discussed with her today.  She feels she is handling things relatively well.  No chest pain.  No sob.  Stays active.  No acid reflux.  No abdominal pain or cramping.  Bowels stable.  No urinary change.  Does not take the trazodone regularly.  Discussed can take on a regular basis to try and break the cycle of not sleeping. Has xanax if needed.  Rarely uses.     Past Medical History:  Diagnosis Date  . Basal cell carcinoma   . Hyperlipidemia    Past Surgical History:  Procedure Laterality Date  . CYST REMOVAL NECK    . REFRACTIVE SURGERY    . SKIN CANCER EXCISION  2001   basal cell   Family History  Problem Relation Age of Onset  . Lung cancer      parent  . Colon cancer      parent   Social History   Social History  . Marital status: Married    Spouse name: N/A  . Number of children: 2  . Years of education: N/A   Social History Main Topics  . Smoking status: Never Smoker  . Smokeless tobacco: Never Used  . Alcohol use 0.0 oz/week  . Drug use: No  . Sexual activity: Not Asked   Other Topics Concern  . None   Social History Narrative  . None    Outpatient Encounter Prescriptions as of 11/06/2016  Medication Sig  . ALPRAZolam (XANAX) 0.25 MG tablet Take 1 tablet (0.25 mg total) by mouth daily as needed.  . diphenhydrAMINE (BENADRYL) 25 MG tablet Take 25 mg by mouth as needed for itching.  . estradiol (ESTRACE) 0.1 MG/GM vaginal cream Place 2 g vaginally daily.  . Ibuprofen-Diphenhydramine Cit (ADVIL PM PO) Take by mouth as needed.  . traZODone (DESYREL) 50 MG tablet Take 0.5-1 tablets (25-50 mg total) by mouth at bedtime as needed for sleep.  . [DISCONTINUED] ALPRAZolam (XANAX) 0.25 MG tablet Take 1 tablet  (0.25 mg total) by mouth daily as needed.  . [DISCONTINUED] atropine 1 % ophthalmic solution Place 1 drop into the left eye 2 (two) times daily.  . [DISCONTINUED] ciprofloxacin (CILOXAN) 0.3 % ophthalmic solution Place 1 drop into the left eye 4 (four) times daily.  . [DISCONTINUED] traZODone (DESYREL) 50 MG tablet Take 0.5-1 tablets (25-50 mg total) by mouth at bedtime as needed for sleep.   No facility-administered encounter medications on file as of 11/06/2016.     Review of Systems  Constitutional: Negative for appetite change and unexpected weight change.  HENT: Negative for congestion and sinus pressure.   Respiratory: Negative for cough, chest tightness and shortness of breath.   Cardiovascular: Negative for chest pain, palpitations and leg swelling.  Gastrointestinal: Negative for abdominal pain, diarrhea, nausea and vomiting.  Genitourinary: Negative for difficulty urinating and dysuria.  Musculoskeletal: Negative for back pain and joint swelling.  Skin: Negative for color change and rash.  Neurological: Negative for dizziness, light-headedness and headaches.  Psychiatric/Behavioral: Negative for agitation and dysphoric mood.       Increased stress as outlined.         Objective:     Pulse rechecked by  me:  24  Physical Exam  Constitutional: She appears well-developed and well-nourished. No distress.  HENT:  Nose: Nose normal.  Mouth/Throat: Oropharynx is clear and moist.  Neck: Neck supple. No thyromegaly present.  Cardiovascular: Normal rate and regular rhythm.   Pulmonary/Chest: Breath sounds normal. No respiratory distress. She has no wheezes.  Abdominal: Soft. Bowel sounds are normal. There is no tenderness.  Musculoskeletal: She exhibits no edema or tenderness.  Lymphadenopathy:    She has no cervical adenopathy.  Skin: No rash noted. No erythema.  Psychiatric: She has a normal mood and affect. Her behavior is normal.    BP 138/84   Pulse (!) 102   Temp 98  F (36.7 C) (Oral)   Ht 5\' 6"  (1.676 m)   Wt 157 lb 3.2 oz (71.3 kg)   LMP 11/02/2010   SpO2 99%   BMI 25.37 kg/m  Wt Readings from Last 3 Encounters:  11/06/16 157 lb 3.2 oz (71.3 kg)  07/20/16 157 lb 4 oz (71.3 kg)  05/08/16 155 lb 6 oz (70.5 kg)     Lab Results  Component Value Date   WBC 7.7 05/03/2016   HGB 12.1 05/03/2016   HCT 35.7 (L) 05/03/2016   PLT 263.0 05/03/2016   GLUCOSE 92 05/03/2016   CHOL 172 05/03/2016   TRIG 334.0 (H) 05/03/2016   HDL 27.80 (L) 05/03/2016   LDLDIRECT 72.0 05/03/2016   LDLCALC 99 04/08/2015   ALT 16 05/03/2016   AST 17 05/03/2016   NA 138 05/03/2016   K 3.9 05/03/2016   CL 104 05/03/2016   CREATININE 0.72 05/03/2016   BUN 12 05/03/2016   CO2 28 05/03/2016   TSH 2.20 05/03/2016       Assessment & Plan:   Problem List Items Addressed This Visit    Elevated blood pressure reading    Blood pressure as outlined.  Recheck improved.  Follow pressures.        Hypercholesterolemia    Low cholesterol diet and exercise.  She is just now getting back in the routine.  Will postpone recheck of cholesterol.  Follow.        Stress    Increased stress.  Discussed with her.  She does appear to be doing better.  Follow.         Other Visit Diagnoses    Encounter for immunization       Relevant Orders   Flu Vaccine QUAD 36+ mos IM (Completed)       Einar Pheasant, MD

## 2016-11-06 NOTE — Progress Notes (Signed)
Pre visit review using our clinic review tool, if applicable. No additional management support is needed unless otherwise documented below in the visit note. 

## 2016-11-11 ENCOUNTER — Encounter: Payer: Self-pay | Admitting: Internal Medicine

## 2016-11-11 NOTE — Assessment & Plan Note (Signed)
Blood pressure as outlined.  Recheck improved.  Follow pressures.

## 2016-11-11 NOTE — Assessment & Plan Note (Signed)
Low cholesterol diet and exercise.  She is just now getting back in the routine.  Will postpone recheck of cholesterol.  Follow.

## 2016-11-11 NOTE — Assessment & Plan Note (Signed)
Increased stress.  Discussed with her.  She does appear to be doing better.  Follow.

## 2016-12-18 ENCOUNTER — Encounter: Payer: Self-pay | Admitting: Internal Medicine

## 2017-01-01 ENCOUNTER — Encounter: Payer: Self-pay | Admitting: Internal Medicine

## 2017-01-02 ENCOUNTER — Other Ambulatory Visit: Payer: Self-pay | Admitting: Internal Medicine

## 2017-01-02 NOTE — Telephone Encounter (Signed)
Last OV 11/06/16 last filled 11/06/16 30 1rf

## 2017-01-02 NOTE — Telephone Encounter (Signed)
Refilled trazodone #30 with one refill.   

## 2017-01-02 NOTE — Telephone Encounter (Signed)
Patient has appointment with Dr. Leonia Corona.

## 2017-01-02 NOTE — Telephone Encounter (Signed)
Reviewed husband's chart.  Saw Dr Caryl Bis today.

## 2017-01-03 DIAGNOSIS — K080 Exfoliation of teeth due to systemic causes: Secondary | ICD-10-CM | POA: Diagnosis not present

## 2017-02-07 ENCOUNTER — Encounter: Payer: Self-pay | Admitting: Internal Medicine

## 2017-02-07 ENCOUNTER — Ambulatory Visit (INDEPENDENT_AMBULATORY_CARE_PROVIDER_SITE_OTHER): Payer: Federal, State, Local not specified - PPO | Admitting: Internal Medicine

## 2017-02-07 DIAGNOSIS — N3010 Interstitial cystitis (chronic) without hematuria: Secondary | ICD-10-CM

## 2017-02-07 DIAGNOSIS — E7800 Pure hypercholesterolemia, unspecified: Secondary | ICD-10-CM

## 2017-02-07 DIAGNOSIS — F439 Reaction to severe stress, unspecified: Secondary | ICD-10-CM | POA: Diagnosis not present

## 2017-02-07 MED ORDER — ALPRAZOLAM 0.25 MG PO TABS: 0.2500 mg | ORAL_TABLET | Freq: Every day | ORAL | 0 refills | Status: DC | PRN

## 2017-02-07 MED ORDER — TRAZODONE HCL 50 MG PO TABS: 25.0000 mg | ORAL_TABLET | Freq: Every evening | ORAL | 2 refills | Status: DC | PRN

## 2017-02-07 NOTE — Progress Notes (Signed)
Patient ID: Kelly Tanner, female   DOB: 08-Nov-1961, 56 y.o.   MRN: [id number]   Subjective:    Patient ID: Kelly Tanner, female    DOB: 02-01-1961, 56 y.o.   MRN: [id number]  HPI  Patient here for a scheduled follow up.  She is doing well.  Feels good.  Overall doing better.  Handling stress better.  No chest pain.  No sob.  No acid reflux.  No abdominal pain or cramping.  Bowels stable.  Due to f/u with Dr Jacqlyn Larsen in three weeks.     Past Medical History:  Diagnosis Date  . Basal cell carcinoma   . Hyperlipidemia    Past Surgical History:  Procedure Laterality Date  . CYST REMOVAL NECK    . REFRACTIVE SURGERY    . SKIN CANCER EXCISION  2001   basal cell   Family History  Problem Relation Age of Onset  . Lung cancer      parent  . Colon cancer      parent   Social History   Social History  . Marital status: Married    Spouse name: N/A  . Number of children: 2  . Years of education: N/A   Social History Main Topics  . Smoking status: Never Smoker  . Smokeless tobacco: Never Used  . Alcohol use 0.0 oz/week  . Drug use: No  . Sexual activity: Not Asked   Other Topics Concern  . None   Social History Narrative  . None    Outpatient Encounter Prescriptions as of 02/07/2017  Medication Sig  . ALPRAZolam (XANAX) 0.25 MG tablet Take 1 tablet (0.25 mg total) by mouth daily as needed.  . diphenhydrAMINE (BENADRYL) 25 MG tablet Take 25 mg by mouth as needed for itching.  . estradiol (ESTRACE) 0.1 MG/GM vaginal cream Place 2 g vaginally daily.  . Ibuprofen-Diphenhydramine Cit (ADVIL PM PO) Take by mouth as needed.  . traZODone (DESYREL) 50 MG tablet Take 0.5-1 tablets (25-50 mg total) by mouth at bedtime as needed. for sleep  . [DISCONTINUED] ALPRAZolam (XANAX) 0.25 MG tablet Take 1 tablet (0.25 mg total) by mouth daily as needed.  . [DISCONTINUED] traZODone (DESYREL) 50 MG tablet TAKE 1/2 TO 1 TABLET BY MOUTH AT BEDTIME AS NEEDED FOR SLEEP   No facility-administered  encounter medications on file as of 02/07/2017.     Review of Systems  Constitutional: Negative for appetite change and unexpected weight change.  HENT: Negative for congestion and sinus pressure.   Respiratory: Negative for cough, chest tightness and shortness of breath.   Cardiovascular: Negative for chest pain, palpitations and leg swelling.  Gastrointestinal: Negative for abdominal pain, diarrhea, nausea and vomiting.  Genitourinary: Negative for difficulty urinating and dysuria.  Musculoskeletal: Negative for back pain and joint swelling.  Skin: Negative for color change and rash.  Neurological: Negative for dizziness, light-headedness and headaches.  Psychiatric/Behavioral: Negative for agitation and dysphoric mood.       Objective:    Physical Exam  Constitutional: She appears well-developed and well-nourished. No distress.  HENT:  Nose: Nose normal.  Mouth/Throat: Oropharynx is clear and moist.  Neck: Neck supple. No thyromegaly present.  Cardiovascular: Normal rate and regular rhythm.   Pulmonary/Chest: Breath sounds normal. No respiratory distress. She has no wheezes.  Abdominal: Soft. Bowel sounds are normal. There is no tenderness.  Musculoskeletal: She exhibits no edema or tenderness.  Lymphadenopathy:    She has no cervical adenopathy.  Skin: No rash noted. No erythema.  Psychiatric: She has a normal mood and affect. Her behavior is normal.    BP 134/82 (BP Location: Left Arm, Patient Position: Sitting, Cuff Size: Large)   Pulse 92   Temp 99.3 F (37.4 C) (Oral)   Resp 16   Ht 5\' 6"  (1.676 m)   Wt 155 lb (70.3 kg)   LMP 11/02/2010   SpO2 98%   BMI 25.02 kg/m  Wt Readings from Last 3 Encounters:  02/07/17 155 lb (70.3 kg)  11/06/16 157 lb 3.2 oz (71.3 kg)  07/20/16 157 lb 4 oz (71.3 kg)     Lab Results  Component Value Date   WBC 7.7 05/03/2016   HGB 12.1 05/03/2016   HCT 35.7 (L) 05/03/2016   PLT 263.0 05/03/2016   GLUCOSE 92 05/03/2016   CHOL  172 05/03/2016   TRIG 334.0 (H) 05/03/2016   HDL 27.80 (L) 05/03/2016   LDLDIRECT 72.0 05/03/2016   LDLCALC 99 04/08/2015   ALT 16 05/03/2016   AST 17 05/03/2016   NA 138 05/03/2016   K 3.9 05/03/2016   CL 104 05/03/2016   CREATININE 0.72 05/03/2016   BUN 12 05/03/2016   CO2 28 05/03/2016   TSH 2.20 05/03/2016       Assessment & Plan:   Problem List Items Addressed This Visit    Hypercholesterolemia    Low cholesterol diet and exercise.  Follow lipid panel.        Interstitial cystitis    Followed by Dr Jacqlyn Larsen.  Has f/u scheduled in 3 weeks.        Stress    Increased stress as outlined.  Doing well on current medication regimen.  Follow.            Einar Pheasant, MD

## 2017-02-07 NOTE — Progress Notes (Signed)
Pre-visit discussion using our clinic review tool. No additional management support is needed unless otherwise documented below in the visit note.  

## 2017-02-10 ENCOUNTER — Encounter: Payer: Self-pay | Admitting: Internal Medicine

## 2017-02-10 NOTE — Assessment & Plan Note (Signed)
Followed by Dr Jacqlyn Larsen.  Has f/u scheduled in 3 weeks.

## 2017-02-10 NOTE — Assessment & Plan Note (Signed)
Low cholesterol diet and exercise.  Follow lipid panel.   

## 2017-02-10 NOTE — Assessment & Plan Note (Signed)
Increased stress as outlined.  Doing well on current medication regimen.  Follow.

## 2017-02-21 DIAGNOSIS — R339 Retention of urine, unspecified: Secondary | ICD-10-CM | POA: Diagnosis not present

## 2017-02-21 DIAGNOSIS — N393 Stress incontinence (female) (male): Secondary | ICD-10-CM | POA: Diagnosis not present

## 2017-02-21 DIAGNOSIS — R310 Gross hematuria: Secondary | ICD-10-CM | POA: Diagnosis not present

## 2017-02-21 DIAGNOSIS — N3020 Other chronic cystitis without hematuria: Secondary | ICD-10-CM | POA: Diagnosis not present

## 2017-03-26 DIAGNOSIS — Z85828 Personal history of other malignant neoplasm of skin: Secondary | ICD-10-CM | POA: Diagnosis not present

## 2017-03-26 DIAGNOSIS — L570 Actinic keratosis: Secondary | ICD-10-CM | POA: Diagnosis not present

## 2017-05-13 ENCOUNTER — Other Ambulatory Visit (INDEPENDENT_AMBULATORY_CARE_PROVIDER_SITE_OTHER): Payer: Federal, State, Local not specified - PPO

## 2017-05-13 DIAGNOSIS — E7800 Pure hypercholesterolemia, unspecified: Secondary | ICD-10-CM

## 2017-05-13 LAB — COMPREHENSIVE METABOLIC PANEL
ALK PHOS: 96 U/L (ref 39–117)
ALT: 14 U/L (ref 0–35)
AST: 15 U/L (ref 0–37)
Albumin: 4.6 g/dL (ref 3.5–5.2)
BUN: 12 mg/dL (ref 6–23)
CO2: 27 meq/L (ref 19–32)
Calcium: 9.7 mg/dL (ref 8.4–10.5)
Chloride: 104 mEq/L (ref 96–112)
Creatinine, Ser: 0.84 mg/dL (ref 0.40–1.20)
GFR: 74.46 mL/min (ref >60.00)
GLUCOSE: 92 mg/dL (ref 70–99)
POTASSIUM: 4.2 meq/L (ref 3.5–5.1)
Sodium: 139 mEq/L (ref 135–145)
Total Bilirubin: 0.4 mg/dL (ref 0.2–1.2)
Total Protein: 8 g/dL (ref 6.0–8.3)

## 2017-05-13 LAB — LIPID PANEL
CHOL/HDL RATIO: 5
Cholesterol: 184 mg/dL (ref 0–200)
HDL: 33.8 mg/dL — AB (ref >39.00)
LDL Cholesterol: 112 mg/dL — ABNORMAL HIGH (ref 0–99)
NONHDL: 150.03
Triglycerides: 191 mg/dL — ABNORMAL HIGH (ref 0.0–149.0)
VLDL: 38.2 mg/dL (ref 0.0–40.0)

## 2017-05-14 ENCOUNTER — Encounter: Payer: Self-pay | Admitting: Internal Medicine

## 2017-05-14 ENCOUNTER — Other Ambulatory Visit (HOSPITAL_COMMUNITY)
Admission: RE | Admit: 2017-05-14 | Discharge: 2017-05-14 | Disposition: A | Discharge status: Home / Self Care | Payer: Federal, State, Local not specified - PPO | Source: Ambulatory Visit | Attending: Internal Medicine | Admitting: Internal Medicine

## 2017-05-14 ENCOUNTER — Ambulatory Visit (INDEPENDENT_AMBULATORY_CARE_PROVIDER_SITE_OTHER): Payer: Federal, State, Local not specified - PPO | Admitting: Internal Medicine

## 2017-05-14 VITALS — BP 130/78 | HR 87 | Temp 98.4°F | Resp 12 | Ht 66.0 in | Wt 156.2 lb

## 2017-05-14 DIAGNOSIS — K219 Gastro-esophageal reflux disease without esophagitis: Secondary | ICD-10-CM | POA: Diagnosis not present

## 2017-05-14 DIAGNOSIS — Z800 Family history of malignant neoplasm of digestive organs: Secondary | ICD-10-CM

## 2017-05-14 DIAGNOSIS — Z1231 Encounter for screening mammogram for malignant neoplasm of breast: Secondary | ICD-10-CM

## 2017-05-14 DIAGNOSIS — C4490 Unspecified malignant neoplasm of skin, unspecified: Secondary | ICD-10-CM | POA: Diagnosis not present

## 2017-05-14 DIAGNOSIS — N3010 Interstitial cystitis (chronic) without hematuria: Secondary | ICD-10-CM | POA: Diagnosis not present

## 2017-05-14 DIAGNOSIS — Z1239 Encounter for other screening for malignant neoplasm of breast: Secondary | ICD-10-CM

## 2017-05-14 DIAGNOSIS — E7800 Pure hypercholesterolemia, unspecified: Secondary | ICD-10-CM

## 2017-05-14 DIAGNOSIS — Z0000 Encounter for general adult medical examination without abnormal findings: Secondary | ICD-10-CM

## 2017-05-14 DIAGNOSIS — Z124 Encounter for screening for malignant neoplasm of cervix: Secondary | ICD-10-CM

## 2017-05-14 DIAGNOSIS — F439 Reaction to severe stress, unspecified: Secondary | ICD-10-CM

## 2017-05-14 MED ORDER — TRAZODONE HCL 50 MG PO TABS: 25.0000 mg | ORAL_TABLET | Freq: Every evening | ORAL | 2 refills | Status: DC | PRN

## 2017-05-14 MED ORDER — ALPRAZOLAM 0.25 MG PO TABS: 0.2500 mg | ORAL_TABLET | Freq: Every day | ORAL | 0 refills | Status: DC | PRN

## 2017-05-14 NOTE — Progress Notes (Signed)
Pre-visit discussion using our clinic review tool. No additional management support is needed unless otherwise documented below in the visit note.  

## 2017-05-14 NOTE — Progress Notes (Signed)
Patient ID: Kelly Tanner, female   DOB: 11-Oct-1961, 56 y.o.   MRN: [id number]   Subjective:    Patient ID: Kelly Tanner, female    DOB: Jan 12, 1961, 56 y.o.   MRN: [id number]  HPI  Patient here for her physical exam. She reports she is doing well.  Feels better. Daughter is doing better.  Handling stress.  Does not feel needs any further intervention.  Currently medication controlling.  Saw Dr Jacqlyn Larsen 02/21/17.  Stable.  Recommended f/u in one year.  Discussed cholesterol levels.  Triglycerides improved.  Working on diet.  No chest pain.  No sob.  Some acid reflux.  Eats late.  Takes zantac prn.  No abdominal pain. Bowels moving.     Past Medical History:  Diagnosis Date  . Basal cell carcinoma   . Hyperlipidemia    Past Surgical History:  Procedure Laterality Date  . CYST REMOVAL NECK    . REFRACTIVE SURGERY    . SKIN CANCER EXCISION  2001   basal cell   Family History  Problem Relation Age of Onset  . Lung cancer Unknown        parent  . Colon cancer Unknown        parent   Social History   Social History  . Marital status: Married    Spouse name: N/A  . Number of children: 2  . Years of education: N/A   Social History Main Topics  . Smoking status: Never Smoker  . Smokeless tobacco: Never Used  . Alcohol use 0.0 oz/week  . Drug use: No  . Sexual activity: Not Asked   Other Topics Concern  . None   Social History Narrative  . None    Outpatient Encounter Prescriptions as of 05/14/2017  Medication Sig  . ALPRAZolam (XANAX) 0.25 MG tablet Take 1 tablet (0.25 mg total) by mouth daily as needed.  . diphenhydrAMINE (BENADRYL) 25 MG tablet Take 25 mg by mouth as needed for itching.  . estradiol (ESTRACE) 0.1 MG/GM vaginal cream Place 2 g vaginally daily.  . Ibuprofen-Diphenhydramine Cit (ADVIL PM PO) Take by mouth as needed.  . traZODone (DESYREL) 50 MG tablet Take 0.5-1 tablets (25-50 mg total) by mouth at bedtime as needed. for sleep  . [DISCONTINUED] ALPRAZolam  (XANAX) 0.25 MG tablet Take 1 tablet (0.25 mg total) by mouth daily as needed.  . [DISCONTINUED] traZODone (DESYREL) 50 MG tablet Take 0.5-1 tablets (25-50 mg total) by mouth at bedtime as needed. for sleep   No facility-administered encounter medications on file as of 05/14/2017.     Review of Systems  Constitutional: Negative for appetite change and unexpected weight change.  HENT: Negative for congestion and sinus pressure.   Eyes: Negative for pain and visual disturbance.  Respiratory: Negative for cough, chest tightness and shortness of breath.   Cardiovascular: Negative for chest pain, palpitations and leg swelling.  Gastrointestinal: Negative for abdominal pain, diarrhea, nausea and vomiting.       Some acid reflux as outlined.    Genitourinary: Negative for difficulty urinating and dysuria.  Musculoskeletal: Negative for back pain and joint swelling.  Skin: Negative for color change and rash.  Neurological: Negative for dizziness and headaches.  Hematological: Negative for adenopathy. Does not bruise/bleed easily.  Psychiatric/Behavioral: Negative for agitation and dysphoric mood.       Objective:    Physical Exam  Constitutional: She is oriented to person, place, and time. She appears well-developed and well-nourished. No distress.  HENT:  Nose: Nose normal.  Mouth/Throat: Oropharynx is clear and moist.  Eyes: Right eye exhibits no discharge. Left eye exhibits no discharge. No scleral icterus.  Neck: Neck supple. No thyromegaly present.  Cardiovascular: Normal rate and regular rhythm.   Pulmonary/Chest: Breath sounds normal. No accessory muscle usage. No tachypnea. No respiratory distress. She has no decreased breath sounds. She has no wheezes. She has no rhonchi. Right breast exhibits no inverted nipple, no mass, no nipple discharge and no tenderness (no axillary adenopathy). Left breast exhibits no inverted nipple, no mass, no nipple discharge and no tenderness (no axilarry  adenopathy).  Abdominal: Soft. Bowel sounds are normal. There is no tenderness.  Genitourinary:  Genitourinary Comments: Normal external genitalia.  Vaginal vault without lesions.  Cervix identified.  Pap smear performed.  Could not appreciate any adnexal masses or tenderness.    Musculoskeletal: She exhibits no edema or tenderness.  Lymphadenopathy:    She has no cervical adenopathy.  Neurological: She is alert and oriented to person, place, and time.  Skin: Skin is warm. No rash noted. No erythema.  Psychiatric: She has a normal mood and affect. Her behavior is normal.    BP 130/78 (BP Location: Left Arm, Patient Position: Sitting, Cuff Size: Normal)   Pulse 87   Temp 98.4 F (36.9 C) (Oral)   Resp 12   Ht 5\' 6"  (1.676 m)   Wt 156 lb 3.2 oz (70.9 kg)   LMP 11/02/2010   SpO2 100%   BMI 25.21 kg/m  Wt Readings from Last 3 Encounters:  05/14/17 156 lb 3.2 oz (70.9 kg)  02/07/17 155 lb (70.3 kg)  11/06/16 157 lb 3.2 oz (71.3 kg)     Lab Results  Component Value Date   WBC 7.7 05/03/2016   HGB 12.1 05/03/2016   HCT 35.7 (L) 05/03/2016   PLT 263.0 05/03/2016   GLUCOSE 92 05/13/2017   CHOL 184 05/13/2017   TRIG 191.0 (H) 05/13/2017   HDL 33.80 (L) 05/13/2017   LDLDIRECT 72.0 05/03/2016   LDLCALC 112 (H) 05/13/2017   ALT 14 05/13/2017   AST 15 05/13/2017   NA 139 05/13/2017   K 4.2 05/13/2017   CL 104 05/13/2017   CREATININE 0.84 05/13/2017   BUN 12 05/13/2017   CO2 27 05/13/2017   TSH 2.20 05/03/2016       Assessment & Plan:   Problem List Items Addressed This Visit    Family history of colon cancer    Colonoscopy 09/2012.  Recommended f/u in 5 years.  Follow. Should be notified this year.        GERD (gastroesophageal reflux disease)    Start zantac daily.  Follow.  Recheck and confirm resolved.  Follow. Avoid eating late.       Health care maintenance    Physical today 05/14/17.  Colonoscopy 09/2012 - recommended f/u in 5 years.  Schedule mammogram.  PAP  05/14/17.        Hypercholesterolemia    Low cholesterol diet and exercise.  Follow lipid panel.        Interstitial cystitis    Followed by Dr Jacqlyn Larsen as outlined.  Just evaluated 02/2017.  Stable.  Recommended f/u in one year.        Skin cancer    Followed by Dr Evorn Gong.       Relevant Medications   ALPRAZolam (XANAX) 0.25 MG tablet   Stress    Handling stress.  Doing well.  Follow.  Continue current medication regimen.  Other Visit Diagnoses    Routine general medical examination at a health care facility    -  Primary   Breast cancer screening       Relevant Orders   MM DIGITAL SCREENING BILATERAL   Screening for cervical cancer       Relevant Orders   Cytology - PAP (Completed)       Einar Pheasant, MD

## 2017-05-14 NOTE — Patient Instructions (Signed)
Zantac (ranitidine) 150mg - take one tablet 30 minutes before your evening meal 

## 2017-05-14 NOTE — Assessment & Plan Note (Addendum)
Physical today 05/14/17.  Colonoscopy 09/2012 - recommended f/u in 5 years.  Schedule mammogram.  PAP 05/14/17.

## 2017-05-16 ENCOUNTER — Encounter: Payer: Self-pay | Admitting: Internal Medicine

## 2017-05-16 DIAGNOSIS — K219 Gastro-esophageal reflux disease without esophagitis: Secondary | ICD-10-CM | POA: Insufficient documentation

## 2017-05-16 LAB — CYTOLOGY - PAP
Diagnosis: NEGATIVE
HPV: NOT DETECTED

## 2017-05-16 NOTE — Assessment & Plan Note (Signed)
Followed by Dr Jacqlyn Larsen as outlined.  Just evaluated 02/2017.  Stable.  Recommended f/u in one year.

## 2017-05-16 NOTE — Assessment & Plan Note (Signed)
Start zantac daily.  Follow.  Recheck and confirm resolved.  Follow. Avoid eating late.

## 2017-05-16 NOTE — Assessment & Plan Note (Signed)
Followed by Dr Dasher.   

## 2017-05-16 NOTE — Assessment & Plan Note (Signed)
Low cholesterol diet and exercise.  Follow lipid panel.   

## 2017-05-16 NOTE — Assessment & Plan Note (Signed)
Colonoscopy 09/2012.  Recommended f/u in 5 years.  Follow. Should be notified this year.

## 2017-05-16 NOTE — Assessment & Plan Note (Signed)
Handling stress.  Doing well.  Follow.  Continue current medication regimen.

## 2017-05-17 ENCOUNTER — Encounter: Payer: Self-pay | Admitting: Internal Medicine

## 2017-07-31 DIAGNOSIS — Z1231 Encounter for screening mammogram for malignant neoplasm of breast: Secondary | ICD-10-CM | POA: Diagnosis not present

## 2017-07-31 DIAGNOSIS — Z9289 Personal history of other medical treatment: Secondary | ICD-10-CM | POA: Diagnosis not present

## 2017-07-31 LAB — HM MAMMOGRAPHY

## 2017-08-05 ENCOUNTER — Encounter: Payer: Self-pay | Admitting: Internal Medicine

## 2017-08-05 NOTE — Telephone Encounter (Signed)
Do we have a copy of the results.  I see where it has been abstracted but do not see the results of the mammogram.

## 2017-08-08 DIAGNOSIS — K080 Exfoliation of teeth due to systemic causes: Secondary | ICD-10-CM | POA: Diagnosis not present

## 2017-08-20 ENCOUNTER — Encounter: Payer: Self-pay | Admitting: Internal Medicine

## 2017-08-20 ENCOUNTER — Ambulatory Visit (INDEPENDENT_AMBULATORY_CARE_PROVIDER_SITE_OTHER): Payer: Federal, State, Local not specified - PPO | Admitting: Internal Medicine

## 2017-08-20 DIAGNOSIS — N3010 Interstitial cystitis (chronic) without hematuria: Secondary | ICD-10-CM

## 2017-08-20 DIAGNOSIS — F439 Reaction to severe stress, unspecified: Secondary | ICD-10-CM | POA: Diagnosis not present

## 2017-08-20 DIAGNOSIS — E7800 Pure hypercholesterolemia, unspecified: Secondary | ICD-10-CM

## 2017-08-20 DIAGNOSIS — K219 Gastro-esophageal reflux disease without esophagitis: Secondary | ICD-10-CM | POA: Diagnosis not present

## 2017-08-20 DIAGNOSIS — Z800 Family history of malignant neoplasm of digestive organs: Secondary | ICD-10-CM | POA: Diagnosis not present

## 2017-08-20 MED ORDER — ALPRAZOLAM 0.25 MG PO TABS
0.2500 mg | ORAL_TABLET | Freq: Every day | ORAL | 0 refills | Status: DC | PRN
Start: 2017-08-20 — End: 2017-10-25

## 2017-08-20 MED ORDER — PANTOPRAZOLE SODIUM 40 MG PO TBEC: 40.0000 mg | DELAYED_RELEASE_TABLET | Freq: Every day | ORAL | 3 refills | Status: DC

## 2017-08-20 NOTE — Progress Notes (Signed)
Patient ID: Kelly Tanner, female   DOB: [birth year], 56 y.o.   MRN: [id number]   Subjective:    Patient ID: Kelly Tanner, female    DOB: July 21, 1961, 56 y.o.   MRN: [id number]  HPI  Patient here for a scheduled follow up.  She reports she is doing relatively well.  Tries to stay active.  No chest pain.  No sob.  She is still having some issues with acid reflux.  Taking zantac.  Still having break through symptoms.  Discussed further treatment.  Discussed changing to PPI.  She is scheduled to see GI for an appt in 10/2017.  Planning to discuss colonoscopy.  Will need to discuss reflux.     Past Medical History:  Diagnosis Date  . Basal cell carcinoma   . Hyperlipidemia    Past Surgical History:  Procedure Laterality Date  . CYST REMOVAL NECK    . REFRACTIVE SURGERY    . SKIN CANCER EXCISION  2001   basal cell   Family History  Problem Relation Age of Onset  . Lung cancer Unknown        parent  . Colon cancer Unknown        parent   Social History   Social History  . Marital status: Married    Spouse name: N/A  . Number of children: 2  . Years of education: N/A   Social History Main Topics  . Smoking status: Never Smoker  . Smokeless tobacco: Never Used  . Alcohol use 0.0 oz/week  . Drug use: No  . Sexual activity: Not Asked   Other Topics Concern  . None   Social History Narrative  . None    Outpatient Encounter Prescriptions as of 08/20/2017  Medication Sig  . ALPRAZolam (XANAX) 0.25 MG tablet Take 1 tablet (0.25 mg total) by mouth daily as needed.  . diphenhydrAMINE (BENADRYL) 25 MG tablet Take 25 mg by mouth as needed for itching.  . estradiol (ESTRACE) 0.1 MG/GM vaginal cream Place 2 g vaginally daily.  . Ibuprofen-Diphenhydramine Cit (ADVIL PM PO) Take by mouth as needed.  . traZODone (DESYREL) 50 MG tablet Take 0.5-1 tablets (25-50 mg total) by mouth at bedtime as needed. for sleep  . [DISCONTINUED] ALPRAZolam (XANAX) 0.25 MG tablet Take 1 tablet (0.25  mg total) by mouth daily as needed.  . [DISCONTINUED] ranitidine (ZANTAC) 150 MG tablet Take 150 mg by mouth daily.  . pantoprazole (PROTONIX) 40 MG tablet Take 1 tablet (40 mg total) by mouth daily.   No facility-administered encounter medications on file as of 08/20/2017.     Review of Systems  Constitutional: Negative for appetite change and unexpected weight change.  HENT: Negative for congestion and sinus pressure.   Respiratory: Negative for cough, chest tightness and shortness of breath.   Cardiovascular: Negative for chest pain, palpitations and leg swelling.  Gastrointestinal: Negative for abdominal pain, diarrhea, nausea and vomiting.  Genitourinary: Negative for difficulty urinating and dysuria.  Musculoskeletal: Negative for back pain and joint swelling.  Skin: Negative for color change and rash.  Neurological: Negative for dizziness, light-headedness and headaches.  Psychiatric/Behavioral: Negative for agitation and dysphoric mood.       Objective:     Blood pressure rechecked by me:  128/84  Physical Exam  Constitutional: She appears well-developed and well-nourished. No distress.  HENT:  Nose: Nose normal.  Mouth/Throat: Oropharynx is clear and moist.  Neck: Neck supple. No thyromegaly present.  Cardiovascular: Normal rate and regular rhythm.  Pulmonary/Chest: Breath sounds normal. No respiratory distress. She has no wheezes.  Abdominal: Soft. Bowel sounds are normal. There is no tenderness.  Musculoskeletal: She exhibits no edema or tenderness.  Lymphadenopathy:    She has no cervical adenopathy.  Skin: No rash noted. No erythema.  Psychiatric: She has a normal mood and affect. Her behavior is normal.    BP 130/74 (BP Location: Left Arm, Patient Position: Sitting, Cuff Size: Normal)   Pulse 93   Temp 98.2 F (36.8 C) (Oral)   Resp 20   Ht 5' 6.14" (1.68 m)   Wt 157 lb (71.2 kg)   LMP 11/02/2010   SpO2 99%   BMI 25.23 kg/m  Wt Readings from Last 3  Encounters:  08/20/17 157 lb (71.2 kg)  05/14/17 156 lb 3.2 oz (70.9 kg)  02/07/17 155 lb (70.3 kg)     Lab Results  Component Value Date   WBC 7.7 05/03/2016   HGB 12.1 05/03/2016   HCT 35.7 (L) 05/03/2016   PLT 263.0 05/03/2016   GLUCOSE 92 05/13/2017   CHOL 184 05/13/2017   TRIG 191.0 (H) 05/13/2017   HDL 33.80 (L) 05/13/2017   LDLDIRECT 72.0 05/03/2016   LDLCALC 112 (H) 05/13/2017   ALT 14 05/13/2017   AST 15 05/13/2017   NA 139 05/13/2017   K 4.2 05/13/2017   CL 104 05/13/2017   CREATININE 0.84 05/13/2017   BUN 12 05/13/2017   CO2 27 05/13/2017   TSH 2.20 05/03/2016       Assessment & Plan:   Problem List Items Addressed This Visit    Family history of colon cancer    Colonoscopy 09/2012.  Recommended f/u in 5 years.  Scheduled for f/u.       GERD (gastroesophageal reflux disease)    Persistent acid reflux.  On zantac.  Will change to protonix.  Has app with GI in 10/2017 for evaluation for colonoscopy.  Will discuss her reflux as well.        Relevant Medications   pantoprazole (PROTONIX) 40 MG tablet   Hypercholesterolemia    Low cholesterol diet and exercise.  Follow lipid panel.       Relevant Orders   CBC with Differential/Platelet   TSH   Comprehensive metabolic panel   Lipid panel   Interstitial cystitis    Followed by Dr Jacqlyn Larsen.  Last evaluated 02/2017.  Recommended f/u in one year.        Stress    Handling stress.  Doing well.  Continue current medication regimen.           Einar Pheasant, MD

## 2017-08-23 ENCOUNTER — Encounter: Payer: Self-pay | Admitting: Internal Medicine

## 2017-08-23 NOTE — Assessment & Plan Note (Signed)
Handling stress.  Doing well.  Continue current medication regimen.

## 2017-08-23 NOTE — Assessment & Plan Note (Signed)
Low cholesterol diet and exercise.  Follow lipid panel.   

## 2017-08-23 NOTE — Assessment & Plan Note (Signed)
Colonoscopy 09/2012.  Recommended f/u in 5 years.  Scheduled for f/u.

## 2017-08-23 NOTE — Assessment & Plan Note (Signed)
Persistent acid reflux.  On zantac.  Will change to protonix.  Has app with GI in 10/2017 for evaluation for colonoscopy.  Will discuss her reflux as well.

## 2017-08-23 NOTE — Assessment & Plan Note (Signed)
Followed by Dr Jacqlyn Larsen.  Last evaluated 02/2017.  Recommended f/u in one year.

## 2017-09-02 ENCOUNTER — Encounter: Payer: Self-pay | Admitting: Internal Medicine

## 2017-09-02 DIAGNOSIS — R300 Dysuria: Secondary | ICD-10-CM | POA: Diagnosis not present

## 2017-09-02 NOTE — Telephone Encounter (Signed)
With symptoms and fever, I think she needs to go ahead and be seen today.  Recommend acute care and then can f/u here.

## 2017-09-03 ENCOUNTER — Ambulatory Visit: Payer: Federal, State, Local not specified - PPO | Admitting: Family

## 2017-09-04 DIAGNOSIS — R509 Fever, unspecified: Secondary | ICD-10-CM | POA: Diagnosis not present

## 2017-09-09 ENCOUNTER — Ambulatory Visit (INDEPENDENT_AMBULATORY_CARE_PROVIDER_SITE_OTHER): Payer: Federal, State, Local not specified - PPO | Admitting: Family Medicine

## 2017-09-09 VITALS — BP 150/82 | HR 99 | Temp 98.2°F | Wt 157.8 lb

## 2017-09-09 DIAGNOSIS — J029 Acute pharyngitis, unspecified: Secondary | ICD-10-CM | POA: Insufficient documentation

## 2017-09-09 NOTE — Assessment & Plan Note (Addendum)
Patient with likely strep throat. She's been taking Omnicef and has started to improve. Strep test in the office positive. We'll have her complete the course of Omnicef monitor her symptoms. If worsens or changes she'll be reevaluated.

## 2017-09-09 NOTE — Progress Notes (Signed)
  Tommi Rumps, MD Phone: [phone]  Kelly Tanner is a 56 y.o. female who presents today for same-day visit.  Patient notes she started to feel poorly 9-10 days ago. She has a history of recurrent UTIs and had some UTI symptoms. She has Macrobid on hand from her urologist and took several days of that and Azo. Her urine symptoms resolved and she had a culture through her urologist office that was negative. She has no recurrent UTI symptoms. She notes 8 days ago she developed fever up to 101F. She was evaluated at the walk-in clinic last week and had some achiness and tiredness with slight sore throat. No congestion. Notes they provided her with a prescription of Omnicef to start 3 days ago if her symptoms had not improved and her urine culture was negative. Her sore throat got worse and then she started the [facility] She's not had any fever since going on the [place]. Her sore throat today is a little bit better. No sick contacts. Overall improving at this time.   ROS see history of present illness  Objective  Physical Exam Vitals:   09/09/17 1832  BP: (!) 150/82  Pulse: 99  Temp: 98.2 F (36.8 C)  SpO2: 98%    BP Readings from Last 3 Encounters:  09/09/17 (!) 150/82  08/20/17 130/74  05/14/17 130/78   Wt Readings from Last 3 Encounters:  09/09/17 157 lb 12.8 oz (71.6 kg)  08/20/17 157 lb (71.2 kg)  05/14/17 156 lb 3.2 oz (70.9 kg)    Physical Exam  Constitutional: No distress.  HENT:  Head: Normocephalic and atraumatic.  Mild posterior oropharyngeal erythema with no tonsillar swelling or exudate  Eyes: Pupils are equal, round, and reactive to light. Conjunctivae are normal.  Neck: Neck supple.  Cardiovascular: Normal rate, regular rhythm and normal heart sounds.   Pulmonary/Chest: Effort normal and breath sounds normal.  Lymphadenopathy:    She has no cervical adenopathy.  Neurological: She is alert. Gait normal.  Skin: She is not diaphoretic.      Assessment/Plan: Please see individual problem list.  Sore throat Patient with likely strep throat. She's been taking Omnicef and has started to improve. Strep test in the office positive. We'll have her complete the course of Omnicef monitor her symptoms. If worsens or changes she'll be reevaluated.   Tommi Rumps, MD [place]

## 2017-09-09 NOTE — Patient Instructions (Signed)
Nice to see you. Please complete the course of Omnicef as previously prescribed. If your symptoms do not continue to improve please let us know. If you develop worsening symptoms please be reevaluated.

## 2017-09-10 LAB — POCT RAPID STREP A (OFFICE): Rapid Strep A Screen: POSITIVE — AB

## 2017-09-10 NOTE — Progress Notes (Signed)
entered positive results

## 2017-09-13 ENCOUNTER — Telehealth: Payer: Self-pay | Admitting: Internal Medicine

## 2017-09-13 ENCOUNTER — Other Ambulatory Visit: Payer: Self-pay | Admitting: Internal Medicine

## 2017-09-13 DIAGNOSIS — R197 Diarrhea, unspecified: Secondary | ICD-10-CM

## 2017-09-13 NOTE — Telephone Encounter (Signed)
PCP spoke with patient on the phone. Dr. Scott. Pt has already picked up stool kit. 

## 2017-09-13 NOTE — Telephone Encounter (Signed)
Have her start on a probiotic daily.  (examples of probiotics are culturelle, florastor, [employer] or align).  Take daily.  Also, I would like to check her stool for c.diff given persistent increased diarrhea.  I can order if she wants to come in and pick up

## 2017-09-13 NOTE — Progress Notes (Signed)
Order placed for c.diff test.

## 2017-09-13 NOTE — Telephone Encounter (Signed)
Patient just finished cefdinir yesterday for strep throat, no probiotic was taken with it. Patient started having diarrhea yesterday. She had 6 stools yesterday that were loose with what looked like mucous in them and foul odor. Patient says that she did not wake up during the night having loose stools but as soon as she woke up this morning she has had one stool so far. She says she does not feel bad at all and is not having any other symptoms. She has a trip planned and is supposed to leave today after lunch. Patient is wondering if she should cancel. I advised patient that C-diff is very common after taking antibiotic without probiotic and is also very contagious.

## 2017-09-13 NOTE — Telephone Encounter (Signed)
Pt called and stated that she finished her antibiotic for the positive strep. Pt states that she started with severe diarrhea yesterday. Please advise, thank you!  Call pt @ [phone]

## 2017-09-16 ENCOUNTER — Other Ambulatory Visit: Payer: Federal, State, Local not specified - PPO

## 2017-09-16 DIAGNOSIS — R197 Diarrhea, unspecified: Secondary | ICD-10-CM

## 2017-09-18 LAB — CLOSTRIDIUM DIFFICILE BY PCR: CDIFFPCR: NOT DETECTED

## 2017-09-24 DIAGNOSIS — Z85828 Personal history of other malignant neoplasm of skin: Secondary | ICD-10-CM | POA: Diagnosis not present

## 2017-09-24 DIAGNOSIS — L538 Other specified erythematous conditions: Secondary | ICD-10-CM | POA: Diagnosis not present

## 2017-10-17 DIAGNOSIS — R197 Diarrhea, unspecified: Secondary | ICD-10-CM | POA: Diagnosis not present

## 2017-10-17 DIAGNOSIS — R1013 Epigastric pain: Secondary | ICD-10-CM | POA: Diagnosis not present

## 2017-10-17 DIAGNOSIS — R000 Tachycardia, unspecified: Secondary | ICD-10-CM | POA: Diagnosis not present

## 2017-10-17 DIAGNOSIS — Z800 Family history of malignant neoplasm of digestive organs: Secondary | ICD-10-CM | POA: Diagnosis not present

## 2017-10-17 DIAGNOSIS — K582 Mixed irritable bowel syndrome: Secondary | ICD-10-CM | POA: Diagnosis not present

## 2017-10-17 DIAGNOSIS — F411 Generalized anxiety disorder: Secondary | ICD-10-CM | POA: Diagnosis not present

## 2017-10-17 DIAGNOSIS — D72829 Elevated white blood cell count, unspecified: Secondary | ICD-10-CM | POA: Diagnosis not present

## 2017-10-18 ENCOUNTER — Telehealth: Payer: Self-pay

## 2017-10-18 DIAGNOSIS — R197 Diarrhea, unspecified: Secondary | ICD-10-CM | POA: Diagnosis not present

## 2017-10-18 DIAGNOSIS — D72829 Elevated white blood cell count, unspecified: Secondary | ICD-10-CM | POA: Diagnosis not present

## 2017-10-18 NOTE — Telephone Encounter (Signed)
Pt called stating that you told her to make an appt beginning of next week. Where do you want me to work her in?

## 2017-10-21 ENCOUNTER — Telehealth: Payer: Self-pay | Admitting: *Deleted

## 2017-10-21 NOTE — Telephone Encounter (Signed)
She was seen at acute care.  F/u lab appears to have improved.  Need to know how she is doing.  If feeling better and just needs f/u, I can see her next Wednesday 10/30/17.  Let me know if problems.  Would have to find another w/in spot.

## 2017-10-21 NOTE — Telephone Encounter (Signed)
Copied from [place]. Topic: Quick Communication - Office Called Patient >> Oct 21, 2017 11:22 AM Hewitt Shorts wrote: Reason for CRM: pt is returning call to Garnette Scheuermann best number  [phone]

## 2017-10-21 NOTE — Telephone Encounter (Signed)
Patient says that she is still having trouble with diarrhea. She was feeling better and then it got worse on Thursday again. Saturday she was going constant and said that even when she didn't have to go she still had the urge. Patient feels that she needs to be seen sooner than 11/21. She called [facility] to see if any results were in and they told her they would have to call back.

## 2017-10-21 NOTE — Telephone Encounter (Signed)
Awaiting call back from PCP.

## 2017-10-22 NOTE — Telephone Encounter (Signed)
See me before calling pt.  Reviewed GI notes and results.  It appears that her c.diff was positive.  Also appears, they notified her of results yesterday pm.  Please confirm with pt if notified.  They were going to call in abx for her to treat c.diff.

## 2017-10-22 NOTE — Telephone Encounter (Signed)
I would like to go ahead and see her next Wednesday 10/30/17.  (I think someone is going to be placed on 12:00 spot, but not sure).  Can put her in 12:00 if not being held for someone.

## 2017-10-22 NOTE — Telephone Encounter (Signed)
Patient is aware of results from GI and has started the abx. She stated that she does not need to come in before 10/30/17 and is not sure if she needs to be worked in that day since she will not be finished abx at that time. Patient has an appointment with you on 11/19/17. She says as long as the treatment they are giving her works, she is okay to wait until her upcoming appt unless you would like to see her sooner.

## 2017-10-22 NOTE — Telephone Encounter (Signed)
Patient scheduled for Friday at 3 pm

## 2017-10-25 ENCOUNTER — Encounter: Payer: Self-pay | Admitting: Internal Medicine

## 2017-10-25 ENCOUNTER — Ambulatory Visit: Payer: Federal, State, Local not specified - PPO | Admitting: Internal Medicine

## 2017-10-25 DIAGNOSIS — F439 Reaction to severe stress, unspecified: Secondary | ICD-10-CM

## 2017-10-25 DIAGNOSIS — R197 Diarrhea, unspecified: Secondary | ICD-10-CM

## 2017-10-25 DIAGNOSIS — R000 Tachycardia, unspecified: Secondary | ICD-10-CM

## 2017-10-25 MED ORDER — ALPRAZOLAM 0.25 MG PO TABS: 0.2500 mg | ORAL_TABLET | Freq: Every day | ORAL | 0 refills | Status: DC | PRN

## 2017-10-25 NOTE — Progress Notes (Signed)
Patient ID: Kelly Tanner, female   DOB: 1961-03-22, 56 y.o.   MRN: [id number]   Subjective:    Patient ID: Kelly Tanner, female    DOB: 20-Jul-1961, 56 y.o.   MRN: [id number]  HPI  Patient here as a work in. Was seen in GI recently with persistent diarrhea.  Heart rate was 130s.  Referred to acute care.  White count elevated.  GI called requesting f/u.  She was found to be c.diff positive.  Started oral vancomycin.  Is feeling better.  One soft stool today.  Much improved.  Eating.  No nausea or vomiting.  No abdominal pain.  No chest pain or tightness.  No sob.  No acid reflux.  Discussed c.diff infection with her.  Questions answered.     Past Medical History:  Diagnosis Date  . Basal cell carcinoma   . Hyperlipidemia    Past Surgical History:  Procedure Laterality Date  . CYST REMOVAL NECK    . REFRACTIVE SURGERY    . SKIN CANCER EXCISION  2001   basal cell   Family History  Problem Relation Age of Onset  . Lung cancer Unknown        parent  . Colon cancer Unknown        parent   Social History   Socioeconomic History  . Marital status: Married    Spouse name: None  . Number of children: 2  . Years of education: None  . Highest education level: None  Social Needs  . Financial resource strain: None  . Food insecurity - worry: None  . Food insecurity - inability: None  . Transportation needs - medical: None  . Transportation needs - non-medical: None  Occupational History  . None  Tobacco Use  . Smoking status: Never Smoker  . Smokeless tobacco: Never Used  Substance and Sexual Activity  . Alcohol use: Yes    Alcohol/week: 0.0 oz  . Drug use: No  . Sexual activity: None  Other Topics Concern  . None  Social History Narrative  . None    Outpatient Encounter Medications as of 10/25/2017  Medication Sig  . ALPRAZolam (XANAX) 0.25 MG tablet Take 1 tablet (0.25 mg total) daily as needed by mouth.  . diphenhydrAMINE (BENADRYL) 25 MG tablet Take 25 mg by  mouth as needed for itching.  . estradiol (ESTRACE) 0.1 MG/GM vaginal cream Place 2 g vaginally daily.  . Ibuprofen-Diphenhydramine Cit (ADVIL PM PO) Take by mouth as needed.  . pantoprazole (PROTONIX) 40 MG tablet Take 1 tablet (40 mg total) by mouth daily.  . traZODone (DESYREL) 50 MG tablet Take 0.5-1 tablets (25-50 mg total) by mouth at bedtime as needed. for sleep  . vancomycin (VANCOCIN) 125 MG capsule Take 125 mg 4 (four) times daily by mouth.  . [DISCONTINUED] ALPRAZolam (XANAX) 0.25 MG tablet Take 1 tablet (0.25 mg total) by mouth daily as needed.   No facility-administered encounter medications on file as of 10/25/2017.     Review of Systems  Constitutional: Negative for appetite change and unexpected weight change.  Respiratory: Negative for cough, chest tightness and shortness of breath.   Cardiovascular: Negative for chest pain, palpitations and leg swelling.  Gastrointestinal: Positive for diarrhea. Negative for abdominal pain, nausea and vomiting.  Musculoskeletal: Negative for joint swelling and myalgias.  Skin: Negative for rash.  Neurological: Negative for dizziness and light-headedness.  Psychiatric/Behavioral: Negative for agitation and dysphoric mood.       Objective:  Physical Exam  Constitutional: She appears well-developed and well-nourished. No distress.  HENT:  Mouth/Throat: Oropharynx is clear and moist.  Neck: Neck supple.  Cardiovascular: Normal rate and regular rhythm.  Pulse rate rechecked:  100.    Pulmonary/Chest: Breath sounds normal. No respiratory distress. She has no wheezes.  Abdominal: Soft. Bowel sounds are normal. There is no tenderness.  Musculoskeletal: She exhibits no edema or tenderness.  Lymphadenopathy:    She has no cervical adenopathy.  Skin: No rash noted. No erythema.  Psychiatric: She has a normal mood and affect. Her behavior is normal.    BP 130/82 (BP Location: Left Arm, Patient Position: Sitting, Cuff Size: Normal)    Pulse (!) 111   Temp 99 F (37.2 C) (Oral)   Resp 14   Ht 5\' 6"  (1.676 m)   Wt 157 lb 3.2 oz (71.3 kg)   LMP 11/02/2010   SpO2 98%   BMI 25.37 kg/m  Wt Readings from Last 3 Encounters:  10/25/17 157 lb 3.2 oz (71.3 kg)  09/09/17 157 lb 12.8 oz (71.6 kg)  08/20/17 157 lb (71.2 kg)     Lab Results  Component Value Date   WBC 7.7 05/03/2016   HGB 12.1 05/03/2016   HCT 35.7 (L) 05/03/2016   PLT 263.0 05/03/2016   GLUCOSE 92 05/13/2017   CHOL 184 05/13/2017   TRIG 191.0 (H) 05/13/2017   HDL 33.80 (L) 05/13/2017   LDLDIRECT 72.0 05/03/2016   LDLCALC 112 (H) 05/13/2017   ALT 14 05/13/2017   AST 15 05/13/2017   NA 139 05/13/2017   K 4.2 05/13/2017   CL 104 05/13/2017   CREATININE 0.84 05/13/2017   BUN 12 05/13/2017   CO2 27 05/13/2017   TSH 2.20 05/03/2016       Assessment & Plan:   Problem List Items Addressed This Visit    Diarrhea    Recent test positive for c.diff.  On oral vancomycin.  Diarrhea improved.  Doing better.  Feels better.  Has f/u with GI.       Stress    Increased stress.  Overall she feels she is handling things relatively well.  Follow.  Desires no further intervention.        Tachycardia    Increased heart rate as outlined.  Being treated now for c.diff.  Feels better.  White count improved on last check.  Heart rate 100 today on my check.  She is doing better.  Follow.  Hold on further evaluation/work up.  Treat current infection.           Einar Pheasant, MD

## 2017-10-27 ENCOUNTER — Encounter: Payer: Self-pay | Admitting: Internal Medicine

## 2017-10-27 DIAGNOSIS — R000 Tachycardia, unspecified: Secondary | ICD-10-CM | POA: Insufficient documentation

## 2017-10-27 NOTE — Assessment & Plan Note (Signed)
Increased heart rate as outlined.  Being treated now for c.diff.  Feels better.  White count improved on last check.  Heart rate 100 today on my check.  She is doing better.  Follow.  Hold on further evaluation/work up.  Treat current infection.

## 2017-10-27 NOTE — Assessment & Plan Note (Signed)
Increased stress.  Overall she feels she is handling things relatively well.  Follow.  Desires no further intervention.

## 2017-10-27 NOTE — Assessment & Plan Note (Signed)
Recent test positive for c.diff.  On oral vancomycin.  Diarrhea improved.  Doing better.  Feels better.  Has f/u with GI.

## 2017-11-07 ENCOUNTER — Other Ambulatory Visit
Admission: RE | Admit: 2017-11-07 | Discharge: 2017-11-07 | Disposition: A | Discharge status: Home / Self Care | Payer: Federal, State, Local not specified - PPO | Source: Ambulatory Visit | Attending: Gastroenterology | Admitting: Gastroenterology

## 2017-11-07 DIAGNOSIS — B9689 Other specified bacterial agents as the cause of diseases classified elsewhere: Secondary | ICD-10-CM | POA: Diagnosis not present

## 2017-11-07 LAB — C DIFFICILE QUICK SCREEN W PCR REFLEX
C DIFFICILE (CDIFF) INTERP: NOT DETECTED
C Diff antigen: NEGATIVE
C Diff toxin: NEGATIVE

## 2017-11-11 ENCOUNTER — Encounter: Payer: Self-pay | Admitting: Internal Medicine

## 2017-11-13 ENCOUNTER — Other Ambulatory Visit (INDEPENDENT_AMBULATORY_CARE_PROVIDER_SITE_OTHER): Payer: Federal, State, Local not specified - PPO

## 2017-11-13 DIAGNOSIS — E7800 Pure hypercholesterolemia, unspecified: Secondary | ICD-10-CM

## 2017-11-13 LAB — CBC WITH DIFFERENTIAL/PLATELET
BASOS PCT: 0.4 % (ref 0.0–3.0)
Basophils Absolute: 0 10*3/uL (ref 0.0–0.1)
EOS ABS: 0.1 10*3/uL (ref 0.0–0.7)
EOS PCT: 1.1 % (ref 0.0–5.0)
HCT: 36.8 % (ref 36.0–46.0)
Hemoglobin: 12.3 g/dL (ref 12.0–15.0)
LYMPHS ABS: 1.8 10*3/uL (ref 0.7–4.0)
Lymphocytes Relative: 22.1 % (ref 12.0–46.0)
MCHC: 33.5 g/dL (ref 30.0–36.0)
MCV: 89 fl (ref 78.0–100.0)
MONO ABS: 0.4 10*3/uL (ref 0.1–1.0)
Monocytes Relative: 4.9 % (ref 3.0–12.0)
NEUTROS ABS: 5.7 10*3/uL (ref 1.4–7.7)
Neutrophils Relative %: 71.5 % (ref 43.0–77.0)
PLATELETS: 255 10*3/uL (ref 150.0–400.0)
RBC: 4.14 Mil/uL (ref 3.87–5.11)
RDW: 14.2 % (ref 11.5–15.5)
WBC: 8 10*3/uL (ref 4.0–10.5)

## 2017-11-13 LAB — COMPREHENSIVE METABOLIC PANEL
ALBUMIN: 4.6 g/dL (ref 3.5–5.2)
ALT: 13 U/L (ref 0–35)
AST: 16 U/L (ref 0–37)
Alkaline Phosphatase: 81 U/L (ref 39–117)
BUN: 6 mg/dL (ref 6–23)
CHLORIDE: 103 meq/L (ref 96–112)
CO2: 28 mEq/L (ref 19–32)
CREATININE: 0.7 mg/dL (ref 0.40–1.20)
Calcium: 9.3 mg/dL (ref 8.4–10.5)
GFR: 91.73 mL/min (ref >60.00)
GLUCOSE: 88 mg/dL (ref 70–99)
POTASSIUM: 3.6 meq/L (ref 3.5–5.1)
SODIUM: 139 meq/L (ref 135–145)
Total Bilirubin: 0.5 mg/dL (ref 0.2–1.2)
Total Protein: 7.9 g/dL (ref 6.0–8.3)

## 2017-11-13 LAB — TSH: TSH: 1.96 u[IU]/mL (ref 0.35–4.50)

## 2017-11-13 LAB — LIPID PANEL
Cholesterol: 194 mg/dL (ref 0–200)
HDL: 37 mg/dL — ABNORMAL LOW (ref >39.00)
NONHDL: 156.95
Total CHOL/HDL Ratio: 5
Triglycerides: 262 mg/dL — ABNORMAL HIGH (ref 0.0–149.0)
VLDL: 52.4 mg/dL — ABNORMAL HIGH (ref 0.0–40.0)

## 2017-11-13 LAB — LDL CHOLESTEROL, DIRECT: LDL DIRECT: 116 mg/dL

## 2017-11-14 ENCOUNTER — Other Ambulatory Visit: Payer: Federal, State, Local not specified - PPO

## 2017-11-14 ENCOUNTER — Encounter: Payer: Self-pay | Admitting: Internal Medicine

## 2017-11-15 NOTE — Telephone Encounter (Signed)
Kelly Tanner, please schedule her for a f/u in March.  Cancel her appt next week if not already cancelled.  Thanks    Dr Nicki Reaper

## 2017-11-19 ENCOUNTER — Ambulatory Visit: Payer: Federal, State, Local not specified - PPO | Admitting: Internal Medicine

## 2017-11-20 NOTE — Telephone Encounter (Signed)
Pt scheduled for follow up and Cpe

## 2017-11-25 DIAGNOSIS — A0472 Enterocolitis due to Clostridium difficile, not specified as recurrent: Secondary | ICD-10-CM | POA: Diagnosis not present

## 2017-12-17 ENCOUNTER — Other Ambulatory Visit
Admission: RE | Admit: 2017-12-17 | Discharge: 2017-12-17 | Disposition: A | Discharge status: Home / Self Care | Payer: Federal, State, Local not specified - PPO | Source: Ambulatory Visit | Attending: Gastroenterology | Admitting: Gastroenterology

## 2017-12-17 DIAGNOSIS — Z87898 Personal history of other specified conditions: Secondary | ICD-10-CM | POA: Insufficient documentation

## 2017-12-17 LAB — GASTROINTESTINAL PANEL BY PCR, STOOL (REPLACES STOOL CULTURE)

## 2017-12-17 LAB — C DIFFICILE QUICK SCREEN W PCR REFLEX
C DIFFICILE (CDIFF) TOXIN: NEGATIVE
C DIFFICLE (CDIFF) ANTIGEN: POSITIVE — AB

## 2017-12-17 LAB — CLOSTRIDIUM DIFFICILE BY PCR, REFLEXED: CDIFFPCR: POSITIVE — AB

## 2017-12-24 DIAGNOSIS — A0472 Enterocolitis due to Clostridium difficile, not specified as recurrent: Secondary | ICD-10-CM | POA: Diagnosis not present

## 2017-12-24 DIAGNOSIS — K580 Irritable bowel syndrome with diarrhea: Secondary | ICD-10-CM | POA: Diagnosis not present

## 2018-01-13 DIAGNOSIS — Z800 Family history of malignant neoplasm of digestive organs: Secondary | ICD-10-CM | POA: Diagnosis not present

## 2018-01-13 DIAGNOSIS — R1013 Epigastric pain: Secondary | ICD-10-CM | POA: Diagnosis not present

## 2018-01-13 DIAGNOSIS — Z8619 Personal history of other infectious and parasitic diseases: Secondary | ICD-10-CM | POA: Diagnosis not present

## 2018-01-20 DIAGNOSIS — Z1389 Encounter for screening for other disorder: Secondary | ICD-10-CM | POA: Diagnosis not present

## 2018-01-20 DIAGNOSIS — K229 Disease of esophagus, unspecified: Secondary | ICD-10-CM | POA: Diagnosis not present

## 2018-01-20 DIAGNOSIS — K30 Functional dyspepsia: Secondary | ICD-10-CM | POA: Diagnosis not present

## 2018-01-20 DIAGNOSIS — K640 First degree hemorrhoids: Secondary | ICD-10-CM | POA: Diagnosis not present

## 2018-01-20 DIAGNOSIS — Z800 Family history of malignant neoplasm of digestive organs: Secondary | ICD-10-CM | POA: Diagnosis not present

## 2018-01-20 DIAGNOSIS — K5289 Other specified noninfective gastroenteritis and colitis: Secondary | ICD-10-CM | POA: Diagnosis not present

## 2018-01-20 DIAGNOSIS — K219 Gastro-esophageal reflux disease without esophagitis: Secondary | ICD-10-CM | POA: Diagnosis not present

## 2018-01-20 DIAGNOSIS — Z1211 Encounter for screening for malignant neoplasm of colon: Secondary | ICD-10-CM | POA: Diagnosis not present

## 2018-01-20 DIAGNOSIS — K3189 Other diseases of stomach and duodenum: Secondary | ICD-10-CM | POA: Diagnosis not present

## 2018-01-20 LAB — HM COLONOSCOPY

## 2018-02-20 DIAGNOSIS — K219 Gastro-esophageal reflux disease without esophagitis: Secondary | ICD-10-CM | POA: Diagnosis not present

## 2018-02-20 DIAGNOSIS — Z8371 Family history of colonic polyps: Secondary | ICD-10-CM | POA: Diagnosis not present

## 2018-02-25 ENCOUNTER — Ambulatory Visit: Payer: Federal, State, Local not specified - PPO | Admitting: Internal Medicine

## 2018-02-25 DIAGNOSIS — E7800 Pure hypercholesterolemia, unspecified: Secondary | ICD-10-CM

## 2018-02-25 DIAGNOSIS — F439 Reaction to severe stress, unspecified: Secondary | ICD-10-CM | POA: Diagnosis not present

## 2018-02-25 DIAGNOSIS — R197 Diarrhea, unspecified: Secondary | ICD-10-CM | POA: Diagnosis not present

## 2018-02-25 DIAGNOSIS — R030 Elevated blood-pressure reading, without diagnosis of hypertension: Secondary | ICD-10-CM

## 2018-02-25 DIAGNOSIS — D649 Anemia, unspecified: Secondary | ICD-10-CM

## 2018-02-25 DIAGNOSIS — N3010 Interstitial cystitis (chronic) without hematuria: Secondary | ICD-10-CM | POA: Diagnosis not present

## 2018-02-25 DIAGNOSIS — K219 Gastro-esophageal reflux disease without esophagitis: Secondary | ICD-10-CM

## 2018-02-25 MED ORDER — ALPRAZOLAM 0.25 MG PO TABS: 0.2500 mg | ORAL_TABLET | Freq: Every day | ORAL | 0 refills | Status: DC | PRN

## 2018-02-25 NOTE — Progress Notes (Signed)
Patient ID: Kelly Tanner, female   DOB: Nov 21, 1961, 57 y.o.   MRN: [id number]   Subjective:    Patient ID: Kelly Tanner, female    DOB: [birth year], 57 y.o.   MRN: [id number]  HPI  Patient here for a scheduled follow up. She recently saw GI in f/u - 02/20/18.  Minimal to no symptoms off PPI.  Using zantac prn.  Is s/p EGD and colonoscopy.  Given family history of colon polyps, but f/u in 5 years.  No chest pain.  No sob.  No acid reflux.  No abdominal pain.  Bowels moving.  No urinary issues today.  Her daughter is doing better.  Stress is better.  Overall feels good.     Past Medical History:  Diagnosis Date  . Basal cell carcinoma   . Hyperlipidemia    Past Surgical History:  Procedure Laterality Date  . CYST REMOVAL NECK    . REFRACTIVE SURGERY    . SKIN CANCER EXCISION  2001   basal cell   Family History  Problem Relation Age of Onset  . Lung cancer Unknown        parent  . Colon cancer Unknown        parent   Social History   Socioeconomic History  . Marital status: Married    Spouse name: Not on file  . Number of children: 2  . Years of education: Not on file  . Highest education level: Not on file  Occupational History  . Not on file  Social Needs  . Financial resource strain: Not on file  . Food insecurity:    Worry: Not on file    Inability: Not on file  . Transportation needs:    Medical: Not on file    Non-medical: Not on file  Tobacco Use  . Smoking status: Never Smoker  . Smokeless tobacco: Never Used  Substance and Sexual Activity  . Alcohol use: Yes    Alcohol/week: 0.0 oz  . Drug use: No  . Sexual activity: Not on file  Lifestyle  . Physical activity:    Days per week: Not on file    Minutes per session: Not on file  . Stress: Not on file  Relationships  . Social connections:    Talks on phone: Not on file    Gets together: Not on file    Attends religious service: Not on file    Active member of club or organization: Not on file   Attends meetings of clubs or organizations: Not on file    Relationship status: Not on file  Other Topics Concern  . Not on file  Social History Narrative  . Not on file    Outpatient Encounter Medications as of 02/25/2018  Medication Sig  . ALPRAZolam (XANAX) 0.25 MG tablet Take 1 tablet (0.25 mg total) by mouth daily as needed.  . diphenhydrAMINE (BENADRYL) 25 MG tablet Take 25 mg by mouth as needed for itching.  . estradiol (ESTRACE) 0.1 MG/GM vaginal cream Place 2 g vaginally daily.  . Ibuprofen-Diphenhydramine Cit (ADVIL PM PO) Take by mouth as needed.  . pantoprazole (PROTONIX) 40 MG tablet Take 1 tablet (40 mg total) by mouth daily.  . traZODone (DESYREL) 50 MG tablet Take 0.5-1 tablets (25-50 mg total) by mouth at bedtime as needed. for sleep  . [DISCONTINUED] ALPRAZolam (XANAX) 0.25 MG tablet Take 1 tablet (0.25 mg total) daily as needed by mouth.  . [DISCONTINUED] vancomycin (VANCOCIN) 125 MG capsule Take  125 mg 4 (four) times daily by mouth.   No facility-administered encounter medications on file as of 02/25/2018.     Review of Systems  Constitutional: Negative for appetite change and unexpected weight change.  HENT: Negative for congestion and sinus pressure.   Respiratory: Negative for cough, chest tightness and shortness of breath.   Cardiovascular: Negative for chest pain, palpitations and leg swelling.  Gastrointestinal: Negative for abdominal pain, diarrhea, nausea and vomiting.  Genitourinary: Negative for difficulty urinating and dysuria.  Musculoskeletal: Negative for joint swelling and myalgias.  Skin: Negative for color change and rash.  Neurological: Negative for dizziness, light-headedness and headaches.  Psychiatric/Behavioral: Negative for agitation and dysphoric mood.       Objective:     Blood pressure rechecked by me:  134/82, pulse 88  Physical Exam  Constitutional: She appears well-developed and well-nourished. No distress.  HENT:  Nose: Nose  normal.  Mouth/Throat: Oropharynx is clear and moist.  Neck: Neck supple. No thyromegaly present.  Cardiovascular: Normal rate and regular rhythm.  Pulmonary/Chest: Breath sounds normal. No respiratory distress. She has no wheezes.  Abdominal: Soft. Bowel sounds are normal. There is no tenderness.  Musculoskeletal: She exhibits no edema or tenderness.  Lymphadenopathy:    She has no cervical adenopathy.  Skin: No rash noted. No erythema.  Psychiatric: She has a normal mood and affect. Her behavior is normal.    BP 134/82   Pulse 88   Temp 99.1 F (37.3 C) (Oral)   Resp 18   Wt 157 lb 9.6 oz (71.5 kg)   LMP 11/02/2010   SpO2 99%   BMI 25.44 kg/m  Wt Readings from Last 3 Encounters:  02/25/18 157 lb 9.6 oz (71.5 kg)  10/25/17 157 lb 3.2 oz (71.3 kg)  09/09/17 157 lb 12.8 oz (71.6 kg)     Lab Results  Component Value Date   WBC 8.0 11/13/2017   HGB 12.3 11/13/2017   HCT 36.8 11/13/2017   PLT 255.0 11/13/2017   GLUCOSE 88 11/13/2017   CHOL 194 11/13/2017   TRIG 262.0 (H) 11/13/2017   HDL 37.00 (L) 11/13/2017   LDLDIRECT 116.0 11/13/2017   LDLCALC 112 (H) 05/13/2017   ALT 13 11/13/2017   AST 16 11/13/2017   NA 139 11/13/2017   K 3.6 11/13/2017   CL 103 11/13/2017   CREATININE 0.70 11/13/2017   BUN 6 11/13/2017   CO2 28 11/13/2017   TSH 1.96 11/13/2017       Assessment & Plan:   Problem List Items Addressed This Visit    Anemia    Follow cbc.       Diarrhea    Resolved.  Was c.diff positive.  Clear now.        Elevated blood pressure reading    Recheck improved.  Follow.       GERD (gastroesophageal reflux disease)    Doing well off PPI.  Only uses zantac prn.  Follow.       Hypercholesterolemia    Low cholesterol diet and exercise.  She has declined medication previously.  Follow.        Relevant Orders   Lipid panel   Hepatic function panel   Basic metabolic panel   Interstitial cystitis    Stable.  Followed by Dr Jacqlyn Larsen.  Last evaluated  02/2017.  Recommended f/u in one year.        Stress    Stress is better.  Daughter doing better.  Follow.  Refilled xanax.  Kelly Pheasant, MD

## 2018-03-02 ENCOUNTER — Encounter: Payer: Self-pay | Admitting: Internal Medicine

## 2018-03-02 NOTE — Assessment & Plan Note (Signed)
Follow cbc.  

## 2018-03-02 NOTE — Assessment & Plan Note (Signed)
Doing well off PPI.  Only uses zantac prn.  Follow.

## 2018-03-02 NOTE — Assessment & Plan Note (Signed)
Stress is better.  Daughter doing better.  Follow.  Refilled xanax.

## 2018-03-02 NOTE — Assessment & Plan Note (Signed)
Stable.  Followed by Dr Jacqlyn Larsen.  Last evaluated 02/2017.  Recommended f/u in one year.

## 2018-03-02 NOTE — Assessment & Plan Note (Signed)
Resolved.  Was c.diff positive.  Clear now.

## 2018-03-02 NOTE — Assessment & Plan Note (Signed)
Recheck improved.  Follow.   

## 2018-03-02 NOTE — Assessment & Plan Note (Signed)
Low cholesterol diet and exercise.  She has declined medication previously.  Follow.

## 2018-03-04 DIAGNOSIS — D485 Neoplasm of uncertain behavior of skin: Secondary | ICD-10-CM | POA: Diagnosis not present

## 2018-03-04 DIAGNOSIS — L905 Scar conditions and fibrosis of skin: Secondary | ICD-10-CM | POA: Diagnosis not present

## 2018-03-04 DIAGNOSIS — L729 Follicular cyst of the skin and subcutaneous tissue, unspecified: Secondary | ICD-10-CM | POA: Diagnosis not present

## 2018-03-26 DIAGNOSIS — Z85828 Personal history of other malignant neoplasm of skin: Secondary | ICD-10-CM | POA: Diagnosis not present

## 2018-03-26 DIAGNOSIS — D2262 Melanocytic nevi of left upper limb, including shoulder: Secondary | ICD-10-CM | POA: Diagnosis not present

## 2018-03-26 DIAGNOSIS — D2261 Melanocytic nevi of right upper limb, including shoulder: Secondary | ICD-10-CM | POA: Diagnosis not present

## 2018-03-26 DIAGNOSIS — D2272 Melanocytic nevi of left lower limb, including hip: Secondary | ICD-10-CM | POA: Diagnosis not present

## 2018-04-02 DIAGNOSIS — K080 Exfoliation of teeth due to systemic causes: Secondary | ICD-10-CM | POA: Diagnosis not present

## 2018-05-19 ENCOUNTER — Other Ambulatory Visit (INDEPENDENT_AMBULATORY_CARE_PROVIDER_SITE_OTHER): Payer: Federal, State, Local not specified - PPO

## 2018-05-19 DIAGNOSIS — E7800 Pure hypercholesterolemia, unspecified: Secondary | ICD-10-CM

## 2018-05-19 LAB — BASIC METABOLIC PANEL
BUN: 12 mg/dL (ref 6–23)
CO2: 27 mEq/L (ref 19–32)
CREATININE: 0.74 mg/dL (ref 0.40–1.20)
Calcium: 9.9 mg/dL (ref 8.4–10.5)
Chloride: 103 mEq/L (ref 96–112)
GFR: 85.88 mL/min (ref >60.00)
Glucose, Bld: 95 mg/dL (ref 70–99)
POTASSIUM: 4.5 meq/L (ref 3.5–5.1)
Sodium: 138 mEq/L (ref 135–145)

## 2018-05-19 LAB — LIPID PANEL
CHOL/HDL RATIO: 6
CHOLESTEROL: 177 mg/dL (ref 0–200)
HDL: 31.2 mg/dL — ABNORMAL LOW (ref >39.00)
NonHDL: 145.98
TRIGLYCERIDES: 220 mg/dL — AB (ref 0.0–149.0)
VLDL: 44 mg/dL — AB (ref 0.0–40.0)

## 2018-05-19 LAB — HEPATIC FUNCTION PANEL
ALT: 13 U/L (ref 0–35)
AST: 15 U/L (ref 0–37)
Albumin: 4.5 g/dL (ref 3.5–5.2)
Alkaline Phosphatase: 100 U/L (ref 39–117)
BILIRUBIN TOTAL: 0.4 mg/dL (ref 0.2–1.2)
Bilirubin, Direct: 0 mg/dL (ref 0.0–0.3)
Total Protein: 7.9 g/dL (ref 6.0–8.3)

## 2018-05-19 LAB — LDL CHOLESTEROL, DIRECT: Direct LDL: 101 mg/dL

## 2018-05-20 ENCOUNTER — Encounter: Payer: Self-pay | Admitting: Internal Medicine

## 2018-05-20 ENCOUNTER — Ambulatory Visit (INDEPENDENT_AMBULATORY_CARE_PROVIDER_SITE_OTHER): Payer: Federal, State, Local not specified - PPO | Admitting: Internal Medicine

## 2018-05-20 VITALS — BP 126/80 | HR 86 | Temp 98.4°F | Resp 18 | Ht 66.0 in | Wt 156.2 lb

## 2018-05-20 DIAGNOSIS — E7800 Pure hypercholesterolemia, unspecified: Secondary | ICD-10-CM

## 2018-05-20 DIAGNOSIS — Z0000 Encounter for general adult medical examination without abnormal findings: Secondary | ICD-10-CM | POA: Diagnosis not present

## 2018-05-20 DIAGNOSIS — Z1231 Encounter for screening mammogram for malignant neoplasm of breast: Secondary | ICD-10-CM | POA: Diagnosis not present

## 2018-05-20 DIAGNOSIS — D649 Anemia, unspecified: Secondary | ICD-10-CM | POA: Diagnosis not present

## 2018-05-20 DIAGNOSIS — Z800 Family history of malignant neoplasm of digestive organs: Secondary | ICD-10-CM

## 2018-05-20 DIAGNOSIS — N3010 Interstitial cystitis (chronic) without hematuria: Secondary | ICD-10-CM

## 2018-05-20 DIAGNOSIS — Z1239 Encounter for other screening for malignant neoplasm of breast: Secondary | ICD-10-CM

## 2018-05-20 DIAGNOSIS — F439 Reaction to severe stress, unspecified: Secondary | ICD-10-CM

## 2018-05-20 MED ORDER — ALPRAZOLAM 0.25 MG PO TABS: 0.2500 mg | ORAL_TABLET | Freq: Every day | ORAL | 0 refills | Status: DC | PRN

## 2018-05-20 MED ORDER — ESTRADIOL 0.1 MG/GM VA CREA: TOPICAL_CREAM | VAGINAL | 1 refills | Status: DC

## 2018-05-20 NOTE — Assessment & Plan Note (Signed)
Physical today 05/20/18.  Colonoscopy - no polyps.  Recommended f/u in 5 years.  Schedule mammogram.  PAP 05/14/17 - negative with negative HPV.

## 2018-05-20 NOTE — Progress Notes (Signed)
Patient ID: Kelly Tanner, female   DOB: 04-02-1961, 57 y.o.   MRN: [id number]   Subjective:    Patient ID: Kelly Tanner, female    DOB: September 28, 1961, 57 y.o.   MRN: [id number]  HPI  Patient here for her physical exam.  She reports she is doing relatively well.  Previously had c.diff.  Bowels doing well now.  S/p EGD and colonoscopy.  EGD - no esophagitis.  Colonoscopy - no polyps.  Using estradiol cream.  Only uses a small amount maybe two times per week.  Stays active.  No chest pain.  No sob.  No acid reflux.  No abdominal pain.  Discussed labs.  Calculated cholesterol risk - 3.4%.  Discussed low cholesterol diet and exercise.  Handling stress.     Past Medical History:  Diagnosis Date  . Basal cell carcinoma   . Hyperlipidemia    Past Surgical History:  Procedure Laterality Date  . CYST REMOVAL NECK    . REFRACTIVE SURGERY    . SKIN CANCER EXCISION  2001   basal cell   Family History  Problem Relation Age of Onset  . Lung cancer Unknown        parent  . Colon cancer Unknown        parent   Social History   Socioeconomic History  . Marital status: Married    Spouse name: Not on file  . Number of children: 2  . Years of education: Not on file  . Highest education level: Not on file  Occupational History  . Not on file  Social Needs  . Financial resource strain: Not on file  . Food insecurity:    Worry: Not on file    Inability: Not on file  . Transportation needs:    Medical: Not on file    Non-medical: Not on file  Tobacco Use  . Smoking status: Never Smoker  . Smokeless tobacco: Never Used  Substance and Sexual Activity  . Alcohol use: Yes    Alcohol/week: 0.0 oz  . Drug use: No  . Sexual activity: Not on file  Lifestyle  . Physical activity:    Days per week: Not on file    Minutes per session: Not on file  . Stress: Not on file  Relationships  . Social connections:    Talks on phone: Not on file    Gets together: Not on file    Attends religious  service: Not on file    Active member of club or organization: Not on file    Attends meetings of clubs or organizations: Not on file    Relationship status: Not on file  Other Topics Concern  . Not on file  Social History Narrative  . Not on file    Outpatient Encounter Medications as of 05/20/2018  Medication Sig  . ALPRAZolam (XANAX) 0.25 MG tablet Take 1 tablet (0.25 mg total) by mouth daily as needed.  . diphenhydrAMINE (BENADRYL) 25 MG tablet Take 25 mg by mouth as needed for itching.  . estradiol (ESTRACE) 0.1 MG/GM vaginal cream As directed.  . Ibuprofen-Diphenhydramine Cit (ADVIL PM PO) Take by mouth as needed.  . [DISCONTINUED] ALPRAZolam (XANAX) 0.25 MG tablet Take 1 tablet (0.25 mg total) by mouth daily as needed.  . [DISCONTINUED] estradiol (ESTRACE) 0.1 MG/GM vaginal cream Place 2 g vaginally daily.  . [DISCONTINUED] pantoprazole (PROTONIX) 40 MG tablet Take 1 tablet (40 mg total) by mouth daily.  . [DISCONTINUED] traZODone (DESYREL) 50 MG  tablet Take 0.5-1 tablets (25-50 mg total) by mouth at bedtime as needed. for sleep   No facility-administered encounter medications on file as of 05/20/2018.     Review of Systems  Constitutional: Negative for appetite change and unexpected weight change.  HENT: Negative for congestion and sinus pressure.   Eyes: Negative for pain and visual disturbance.  Respiratory: Negative for cough, chest tightness and shortness of breath.   Cardiovascular: Negative for chest pain, palpitations and leg swelling.  Gastrointestinal: Negative for abdominal pain, diarrhea, nausea and vomiting.  Genitourinary: Negative for difficulty urinating and dysuria.  Musculoskeletal: Negative for joint swelling and myalgias.  Skin: Negative for color change and rash.  Neurological: Negative for dizziness, light-headedness and headaches.  Hematological: Negative for adenopathy. Does not bruise/bleed easily.  Psychiatric/Behavioral: Negative for agitation and  dysphoric mood.       Objective:    Physical Exam  Constitutional: She is oriented to person, place, and time. She appears well-developed and well-nourished. No distress.  HENT:  Nose: Nose normal.  Mouth/Throat: Oropharynx is clear and moist.  Eyes: Right eye exhibits no discharge. Left eye exhibits no discharge. No scleral icterus.  Neck: Neck supple. No thyromegaly present.  Cardiovascular: Normal rate and regular rhythm.  Pulmonary/Chest: Breath sounds normal. No accessory muscle usage. No tachypnea. No respiratory distress. She has no decreased breath sounds. She has no wheezes. She has no rhonchi. Right breast exhibits no inverted nipple, no mass, no nipple discharge and no tenderness (no axillary adenopathy). Left breast exhibits no inverted nipple, no mass, no nipple discharge and no tenderness (no axilarry adenopathy).  Abdominal: Soft. Bowel sounds are normal. There is no tenderness.  Musculoskeletal: She exhibits no edema or tenderness.  Lymphadenopathy:    She has no cervical adenopathy.  Neurological: She is alert and oriented to person, place, and time.  Skin: No rash noted. No erythema.  Psychiatric: She has a normal mood and affect. Her behavior is normal.    BP 126/80 (BP Location: Left Arm, Patient Position: Sitting, Cuff Size: Normal)   Pulse 86   Temp 98.4 F (36.9 C) (Oral)   Resp 18   Ht 5\' 6"  (1.676 m)   Wt 156 lb 3.2 oz (70.9 kg)   LMP 11/02/2010   SpO2 98%   BMI 25.21 kg/m  Wt Readings from Last 3 Encounters:  05/20/18 156 lb 3.2 oz (70.9 kg)  02/25/18 157 lb 9.6 oz (71.5 kg)  10/25/17 157 lb 3.2 oz (71.3 kg)     Lab Results  Component Value Date   WBC 8.0 11/13/2017   HGB 12.3 11/13/2017   HCT 36.8 11/13/2017   PLT 255.0 11/13/2017   GLUCOSE 95 05/19/2018   CHOL 177 05/19/2018   TRIG 220.0 (H) 05/19/2018   HDL 31.20 (L) 05/19/2018   LDLDIRECT 101.0 05/19/2018   LDLCALC 112 (H) 05/13/2017   ALT 13 05/19/2018   AST 15 05/19/2018   NA 138  05/19/2018   K 4.5 05/19/2018   CL 103 05/19/2018   CREATININE 0.74 05/19/2018   BUN 12 05/19/2018   CO2 27 05/19/2018   TSH 1.96 11/13/2017       Assessment & Plan:   Problem List Items Addressed This Visit    Anemia    Follow cbc.       Family history of colon cancer    Colonoscopy 05/2018.  No polyps.  Recommended f/u in 5 years.        Health care maintenance  Physical today 05/20/18.  Colonoscopy - no polyps.  Recommended f/u in 5 years.  Schedule mammogram.  PAP 05/14/17 - negative with negative HPV.        Hypercholesterolemia    Calculated cholesterol risk - 3.4%.  Low cholesterol diet and exercise.  Follow lipid panel.        Relevant Orders   Hepatic function panel   Lipid panel   Basic metabolic panel   Interstitial cystitis    Stable.  Followed by Dr Jacqlyn Larsen.        Stress    Stress is better.  Daughter doing better.  Follow.         Other Visit Diagnoses    Routine general medical examination at a health care facility    -  Primary   Breast cancer screening       Relevant Orders   MM 3D SCREEN BREAST BILATERAL       Einar Pheasant, MD

## 2018-05-28 ENCOUNTER — Encounter: Payer: Self-pay | Admitting: Internal Medicine

## 2018-05-28 NOTE — Assessment & Plan Note (Signed)
Stable.  Followed by Dr Jacqlyn Larsen.

## 2018-05-28 NOTE — Assessment & Plan Note (Signed)
Follow cbc.  

## 2018-05-28 NOTE — Assessment & Plan Note (Signed)
Calculated cholesterol risk - 3.4%.  Low cholesterol diet and exercise.  Follow lipid panel.

## 2018-05-28 NOTE — Assessment & Plan Note (Signed)
Stress is better.  Daughter doing better.  Follow.

## 2018-05-28 NOTE — Assessment & Plan Note (Addendum)
Colonoscopy 05/2018.  No polyps.  Recommended f/u in 5 years.   

## 2018-08-19 DIAGNOSIS — Z1231 Encounter for screening mammogram for malignant neoplasm of breast: Secondary | ICD-10-CM | POA: Diagnosis not present

## 2018-08-19 LAB — HM MAMMOGRAPHY

## 2018-08-25 ENCOUNTER — Encounter: Payer: Self-pay | Admitting: Internal Medicine

## 2018-09-25 DIAGNOSIS — D2261 Melanocytic nevi of right upper limb, including shoulder: Secondary | ICD-10-CM | POA: Diagnosis not present

## 2018-09-25 DIAGNOSIS — D2262 Melanocytic nevi of left upper limb, including shoulder: Secondary | ICD-10-CM | POA: Diagnosis not present

## 2018-09-25 DIAGNOSIS — L608 Other nail disorders: Secondary | ICD-10-CM | POA: Diagnosis not present

## 2018-09-25 DIAGNOSIS — Z85828 Personal history of other malignant neoplasm of skin: Secondary | ICD-10-CM | POA: Diagnosis not present

## 2018-10-02 DIAGNOSIS — K080 Exfoliation of teeth due to systemic causes: Secondary | ICD-10-CM | POA: Diagnosis not present

## 2018-11-10 DIAGNOSIS — H26491 Other secondary cataract, right eye: Secondary | ICD-10-CM | POA: Diagnosis not present

## 2018-11-18 ENCOUNTER — Other Ambulatory Visit (INDEPENDENT_AMBULATORY_CARE_PROVIDER_SITE_OTHER): Payer: Federal, State, Local not specified - PPO

## 2018-11-18 DIAGNOSIS — E7800 Pure hypercholesterolemia, unspecified: Secondary | ICD-10-CM

## 2018-11-18 LAB — BASIC METABOLIC PANEL
BUN: 14 mg/dL (ref 6–23)
CALCIUM: 9.9 mg/dL (ref 8.4–10.5)
CHLORIDE: 102 meq/L (ref 96–112)
CO2: 27 meq/L (ref 19–32)
Creatinine, Ser: 0.81 mg/dL (ref 0.40–1.20)
GFR: 77.24 mL/min (ref >60.00)
Glucose, Bld: 103 mg/dL — ABNORMAL HIGH (ref 70–99)
POTASSIUM: 4.4 meq/L (ref 3.5–5.1)
SODIUM: 137 meq/L (ref 135–145)

## 2018-11-18 LAB — HEPATIC FUNCTION PANEL
ALK PHOS: 107 U/L (ref 39–117)
ALT: 15 U/L (ref 0–35)
AST: 16 U/L (ref 0–37)
Albumin: 4.5 g/dL (ref 3.5–5.2)
Bilirubin, Direct: 0.1 mg/dL (ref 0.0–0.3)
TOTAL PROTEIN: 8.2 g/dL (ref 6.0–8.3)
Total Bilirubin: 0.6 mg/dL (ref 0.2–1.2)

## 2018-11-18 LAB — LIPID PANEL
CHOL/HDL RATIO: 5
Cholesterol: 174 mg/dL (ref 0–200)
HDL: 37.2 mg/dL — AB (ref >39.00)
LDL Cholesterol: 102 mg/dL — ABNORMAL HIGH (ref 0–99)
NonHDL: 136.31
TRIGLYCERIDES: 173 mg/dL — AB (ref 0.0–149.0)
VLDL: 34.6 mg/dL (ref 0.0–40.0)

## 2018-11-20 ENCOUNTER — Ambulatory Visit: Payer: Federal, State, Local not specified - PPO | Admitting: Internal Medicine

## 2018-11-20 ENCOUNTER — Encounter: Payer: Self-pay | Admitting: Internal Medicine

## 2018-11-20 DIAGNOSIS — N3010 Interstitial cystitis (chronic) without hematuria: Secondary | ICD-10-CM | POA: Diagnosis not present

## 2018-11-20 DIAGNOSIS — D649 Anemia, unspecified: Secondary | ICD-10-CM | POA: Diagnosis not present

## 2018-11-20 DIAGNOSIS — F439 Reaction to severe stress, unspecified: Secondary | ICD-10-CM

## 2018-11-20 DIAGNOSIS — Z23 Encounter for immunization: Secondary | ICD-10-CM | POA: Diagnosis not present

## 2018-11-20 DIAGNOSIS — E7800 Pure hypercholesterolemia, unspecified: Secondary | ICD-10-CM | POA: Diagnosis not present

## 2018-11-20 NOTE — Progress Notes (Signed)
Patient ID: Kelly Tanner, female   DOB: August 24, 1961, 57 y.o.   MRN: [id number]   Subjective:    Patient ID: Kelly Tanner, female    DOB: 03-19-1961, 57 y.o.   MRN: [id number]  HPI  Patient here for a scheduled follow up.   She reports she is doing well.  Family doing well.  Handling stress.  Stays active.  No chest pain.  No sob.  No acid reflux. No abdominal pain.  Bowels moving.  No urine change.  Discussed cholesterol labs.  Discussed calculated cholesterol risk - 2.9%.  Discussed diet and exercise.  Taking [employer].  Doing better.  Overall feels good.  Rarely takes xanax.     Past Medical History:  Diagnosis Date  . Basal cell carcinoma   . Hyperlipidemia    Past Surgical History:  Procedure Laterality Date  . CYST REMOVAL NECK    . REFRACTIVE SURGERY    . SKIN CANCER EXCISION  2001   basal cell   Family History  Problem Relation Age of Onset  . Lung cancer Other        parent  . Colon cancer Other        parent   Social History   Socioeconomic History  . Marital status: Married    Spouse name: Not on file  . Number of children: 2  . Years of education: Not on file  . Highest education level: Not on file  Occupational History  . Not on file  Social Needs  . Financial resource strain: Not on file  . Food insecurity:    Worry: Not on file    Inability: Not on file  . Transportation needs:    Medical: Not on file    Non-medical: Not on file  Tobacco Use  . Smoking status: Never Smoker  . Smokeless tobacco: Never Used  Substance and Sexual Activity  . Alcohol use: Yes    Alcohol/week: 0.0 standard drinks  . Drug use: No  . Sexual activity: Not on file  Lifestyle  . Physical activity:    Days per week: Not on file    Minutes per session: Not on file  . Stress: Not on file  Relationships  . Social connections:    Talks on phone: Not on file    Gets together: Not on file    Attends religious service: Not on file    Active member of club or  organization: Not on file    Attends meetings of clubs or organizations: Not on file    Relationship status: Not on file  Other Topics Concern  . Not on file  Social History Narrative  . Not on file    Outpatient Encounter Medications as of 11/20/2018  Medication Sig  . diphenhydrAMINE (BENADRYL) 25 MG tablet Take 25 mg by mouth as needed for itching.  . estradiol (ESTRACE) 0.1 MG/GM vaginal cream As directed.  . Ibuprofen-Diphenhydramine Cit (ADVIL PM PO) Take by mouth as needed.  . [DISCONTINUED] ALPRAZolam (XANAX) 0.25 MG tablet Take 1 tablet (0.25 mg total) by mouth daily as needed.   No facility-administered encounter medications on file as of 11/20/2018.     Review of Systems  Constitutional: Negative for appetite change and unexpected weight change.  HENT: Negative for congestion and sinus pressure.   Respiratory: Negative for cough, chest tightness and shortness of breath.   Cardiovascular: Negative for chest pain, palpitations and leg swelling.  Gastrointestinal: Negative for abdominal pain, diarrhea, nausea  and vomiting.  Genitourinary: Negative for difficulty urinating and dysuria.  Musculoskeletal: Negative for joint swelling and myalgias.  Skin: Negative for color change and rash.  Neurological: Negative for dizziness, light-headedness and headaches.  Psychiatric/Behavioral: Negative for agitation and dysphoric mood.       Objective:    Physical Exam Constitutional:      General: She is not in acute distress.    Appearance: Normal appearance.  HENT:     Nose: Nose normal. No congestion.     Mouth/Throat:     Pharynx: No oropharyngeal exudate or posterior oropharyngeal erythema.  Neck:     Musculoskeletal: Neck supple. No muscular tenderness.     Thyroid: No thyromegaly.  Cardiovascular:     Rate and Rhythm: Normal rate and regular rhythm.  Pulmonary:     Effort: No respiratory distress.     Breath sounds: Normal breath sounds. No wheezing.  Abdominal:       General: Bowel sounds are normal.     Palpations: Abdomen is soft.     Tenderness: There is no abdominal tenderness.  Musculoskeletal:        General: No swelling or tenderness.  Lymphadenopathy:     Cervical: No cervical adenopathy.  Skin:    Findings: No erythema or rash.  Neurological:     Mental Status: She is alert.  Psychiatric:        Mood and Affect: Mood normal.        Thought Content: Thought content normal.     BP 126/78 (BP Location: Left Arm, Patient Position: Sitting, Cuff Size: Normal)   Pulse (!) 106   Temp 98.3 F (36.8 C) (Oral)   Resp 16   Wt 157 lb 12.8 oz (71.6 kg)   LMP 11/02/2010   SpO2 99%   BMI 25.47 kg/m  Wt Readings from Last 3 Encounters:  11/20/18 157 lb 12.8 oz (71.6 kg)  05/20/18 156 lb 3.2 oz (70.9 kg)  02/25/18 157 lb 9.6 oz (71.5 kg)     Lab Results  Component Value Date   WBC 8.0 11/13/2017   HGB 12.3 11/13/2017   HCT 36.8 11/13/2017   PLT 255.0 11/13/2017   GLUCOSE 103 (H) 11/18/2018   CHOL 174 11/18/2018   TRIG 173.0 (H) 11/18/2018   HDL 37.20 (L) 11/18/2018   LDLDIRECT 101.0 05/19/2018   LDLCALC 102 (H) 11/18/2018   ALT 15 11/18/2018   AST 16 11/18/2018   NA 137 11/18/2018   K 4.4 11/18/2018   CL 102 11/18/2018   CREATININE 0.81 11/18/2018   BUN 14 11/18/2018   CO2 27 11/18/2018   TSH 1.96 11/13/2017       Assessment & Plan:   Problem List Items Addressed This Visit    Anemia    Follow cbc.       Relevant Orders   CBC with Differential/Platelet   Hypercholesterolemia    Discussed recent cholesterol labs and calculated cholesterol risk - 2.9%.  Low cholesterol diet and exercise.  Follow lipid panel.        Relevant Orders   Hepatic function panel   Lipid panel   TSH   Basic metabolic panel   Interstitial cystitis    Was followed by Dr Jacqlyn Larsen.  Stable.        Stress    Better.  Daughter doing better.  Follow.         Other Visit Diagnoses    Need for immunization against influenza  Relevant Orders   Flu Vaccine QUAD 36+ mos IM (Completed)       Einar Pheasant, MD

## 2018-11-24 ENCOUNTER — Other Ambulatory Visit: Payer: Self-pay | Admitting: Internal Medicine

## 2018-11-24 NOTE — Telephone Encounter (Signed)
Copied from [place] [id number]. Topic: Quick Communication - Rx Refill/Question >> Nov 24, 2018 10:10 AM Kelly Tanner wrote: Medication: ALPRAZolam Duanne Moron) 0.25 MG tablet - pt thought RX would be sent in after OV 11/20/18 with Dr. Nicki Reaper She has a few left  Has the patient contacted their pharmacy? Yes - advised to call MD office  Preferred Pharmacy (with phone number or street name): [facility], [place] [phone] (Phone) [phone] (Fax)

## 2018-11-25 MED ORDER — ALPRAZOLAM 0.25 MG PO TABS: 0.2500 mg | ORAL_TABLET | Freq: Every day | ORAL | 0 refills | Status: DC | PRN

## 2018-11-25 NOTE — Telephone Encounter (Signed)
Patient was here on 11/20/2018. Last refill was 05/20/2018. OK to fill?

## 2018-11-26 NOTE — Telephone Encounter (Signed)
faxed

## 2018-11-29 ENCOUNTER — Encounter: Payer: Self-pay | Admitting: Internal Medicine

## 2018-11-29 NOTE — Assessment & Plan Note (Signed)
Better.  Daughter doing better.  Follow.

## 2018-11-29 NOTE — Assessment & Plan Note (Signed)
Discussed recent cholesterol labs and calculated cholesterol risk - 2.9%.  Low cholesterol diet and exercise.  Follow lipid panel.

## 2018-11-29 NOTE — Assessment & Plan Note (Signed)
Was followed by Dr Jacqlyn Larsen.  Stable.

## 2018-11-29 NOTE — Assessment & Plan Note (Signed)
Follow cbc.  

## 2018-12-30 DIAGNOSIS — H26491 Other secondary cataract, right eye: Secondary | ICD-10-CM | POA: Diagnosis not present

## 2019-01-29 DIAGNOSIS — H5989 Other postprocedural complications and disorders of eye and adnexa, not elsewhere classified: Secondary | ICD-10-CM | POA: Diagnosis not present

## 2019-02-19 DIAGNOSIS — K219 Gastro-esophageal reflux disease without esophagitis: Secondary | ICD-10-CM | POA: Diagnosis not present

## 2019-02-19 DIAGNOSIS — K580 Irritable bowel syndrome with diarrhea: Secondary | ICD-10-CM | POA: Diagnosis not present

## 2019-04-30 ENCOUNTER — Other Ambulatory Visit: Payer: Self-pay | Admitting: Internal Medicine

## 2019-05-01 ENCOUNTER — Other Ambulatory Visit: Payer: Self-pay | Admitting: Lab

## 2019-05-01 NOTE — Telephone Encounter (Signed)
Last OV 11/20/18 and last refill same date.

## 2019-05-26 ENCOUNTER — Encounter: Payer: Self-pay | Admitting: Internal Medicine

## 2019-06-02 ENCOUNTER — Other Ambulatory Visit: Payer: Self-pay

## 2019-06-02 ENCOUNTER — Encounter: Payer: Self-pay | Admitting: Internal Medicine

## 2019-06-02 ENCOUNTER — Other Ambulatory Visit (INDEPENDENT_AMBULATORY_CARE_PROVIDER_SITE_OTHER): Payer: Federal, State, Local not specified - PPO

## 2019-06-02 DIAGNOSIS — D649 Anemia, unspecified: Secondary | ICD-10-CM

## 2019-06-02 DIAGNOSIS — E7800 Pure hypercholesterolemia, unspecified: Secondary | ICD-10-CM | POA: Diagnosis not present

## 2019-06-02 LAB — BASIC METABOLIC PANEL
BUN: 12 mg/dL (ref 6–23)
CO2: 27 mEq/L (ref 19–32)
Calcium: 9.5 mg/dL (ref 8.4–10.5)
Chloride: 102 mEq/L (ref 96–112)
Creatinine, Ser: 0.83 mg/dL (ref 0.40–1.20)
GFR: 70.52 mL/min (ref >60.00)
Glucose, Bld: 97 mg/dL (ref 70–99)
Potassium: 4.4 mEq/L (ref 3.5–5.1)
Sodium: 137 mEq/L (ref 135–145)

## 2019-06-02 LAB — CBC WITH DIFFERENTIAL/PLATELET
Basophils Absolute: 0 10*3/uL (ref 0.0–0.1)
Basophils Relative: 0.6 % (ref 0.0–3.0)
Eosinophils Absolute: 0.1 10*3/uL (ref 0.0–0.7)
Eosinophils Relative: 1.7 % (ref 0.0–5.0)
HCT: 36.7 % (ref 36.0–46.0)
Hemoglobin: 12.4 g/dL (ref 12.0–15.0)
Lymphocytes Relative: 15 % (ref 12.0–46.0)
Lymphs Abs: 1.3 10*3/uL (ref 0.7–4.0)
MCHC: 33.8 g/dL (ref 30.0–36.0)
MCV: 91.3 fl (ref 78.0–100.0)
Monocytes Absolute: 0.6 10*3/uL (ref 0.1–1.0)
Monocytes Relative: 6.7 % (ref 3.0–12.0)
Neutro Abs: 6.5 10*3/uL (ref 1.4–7.7)
Neutrophils Relative %: 76 % (ref 43.0–77.0)
Platelets: 232 10*3/uL (ref 150.0–400.0)
RBC: 4.02 Mil/uL (ref 3.87–5.11)
RDW: 14.7 % (ref 11.5–15.5)
WBC: 8.5 10*3/uL (ref 4.0–10.5)

## 2019-06-02 LAB — LIPID PANEL
Cholesterol: 183 mg/dL (ref 0–200)
HDL: 36.2 mg/dL — ABNORMAL LOW (ref >39.00)
LDL Cholesterol: 113 mg/dL — ABNORMAL HIGH (ref 0–99)
NonHDL: 146.96
Total CHOL/HDL Ratio: 5
Triglycerides: 168 mg/dL — ABNORMAL HIGH (ref 0.0–149.0)
VLDL: 33.6 mg/dL (ref 0.0–40.0)

## 2019-06-02 LAB — HEPATIC FUNCTION PANEL
ALT: 20 U/L (ref 0–35)
AST: 19 U/L (ref 0–37)
Albumin: 4.5 g/dL (ref 3.5–5.2)
Alkaline Phosphatase: 107 U/L (ref 39–117)
Bilirubin, Direct: 0.1 mg/dL (ref 0.0–0.3)
Total Bilirubin: 0.8 mg/dL (ref 0.2–1.2)
Total Protein: 7.4 g/dL (ref 6.0–8.3)

## 2019-06-02 LAB — TSH: TSH: 2.03 u[IU]/mL (ref 0.35–4.50)

## 2019-06-04 ENCOUNTER — Other Ambulatory Visit: Payer: Self-pay

## 2019-06-04 ENCOUNTER — Encounter: Payer: Self-pay | Admitting: Internal Medicine

## 2019-06-04 ENCOUNTER — Ambulatory Visit (INDEPENDENT_AMBULATORY_CARE_PROVIDER_SITE_OTHER): Payer: Federal, State, Local not specified - PPO | Admitting: Internal Medicine

## 2019-06-04 ENCOUNTER — Other Ambulatory Visit (HOSPITAL_COMMUNITY)
Admission: RE | Admit: 2019-06-04 | Discharge: 2019-06-04 | Disposition: A | Discharge status: Home / Self Care | Payer: Federal, State, Local not specified - PPO | Source: Ambulatory Visit | Attending: Internal Medicine | Admitting: Internal Medicine

## 2019-06-04 VITALS — BP 124/84 | HR 80 | Temp 98.7°F | Resp 16 | Wt 155.8 lb

## 2019-06-04 DIAGNOSIS — E7800 Pure hypercholesterolemia, unspecified: Secondary | ICD-10-CM

## 2019-06-04 DIAGNOSIS — Z124 Encounter for screening for malignant neoplasm of cervix: Secondary | ICD-10-CM | POA: Diagnosis not present

## 2019-06-04 DIAGNOSIS — D649 Anemia, unspecified: Secondary | ICD-10-CM

## 2019-06-04 DIAGNOSIS — Z1231 Encounter for screening mammogram for malignant neoplasm of breast: Secondary | ICD-10-CM | POA: Diagnosis not present

## 2019-06-04 DIAGNOSIS — R030 Elevated blood-pressure reading, without diagnosis of hypertension: Secondary | ICD-10-CM

## 2019-06-04 DIAGNOSIS — Z0000 Encounter for general adult medical examination without abnormal findings: Secondary | ICD-10-CM | POA: Diagnosis not present

## 2019-06-04 DIAGNOSIS — F439 Reaction to severe stress, unspecified: Secondary | ICD-10-CM

## 2019-06-04 MED ORDER — ALPRAZOLAM 0.25 MG PO TABS: ORAL_TABLET | ORAL | 0 refills | Status: DC

## 2019-06-04 NOTE — Progress Notes (Signed)
Patient ID: Kelly Tanner, female   DOB: Sep 29, 1961, 58 y.o.   MRN: [id number]   Subjective:    Patient ID: Kelly Tanner, female    DOB: 1961-06-04, 58 y.o.   MRN: [id number]  HPI  Patient here for her physical exam.  She reports increased stress. disucssed with her today.  She is taking xanax prn.  Does not feel needs anything more at this time.  Trying to stay active.  No chest pain.  No sob.  No acid reflux.  No abdominal pain.  Bowels moving.  Bowels better.  Takes probiotics.  Discussed labs.  Discussed continued diet and exercise.     Past Medical History:  Diagnosis Date  . Basal cell carcinoma   . Hyperlipidemia    Past Surgical History:  Procedure Laterality Date  . CYST REMOVAL NECK    . REFRACTIVE SURGERY    . SKIN CANCER EXCISION  2001   basal cell   Family History  Problem Relation Age of Onset  . Lung cancer Other        parent  . Colon cancer Other        parent   Social History   Socioeconomic History  . Marital status: Married    Spouse name: Not on file  . Number of children: 2  . Years of education: Not on file  . Highest education level: Not on file  Occupational History  . Not on file  Social Needs  . Financial resource strain: Not on file  . Food insecurity    Worry: Not on file    Inability: Not on file  . Transportation needs    Medical: Not on file    Non-medical: Not on file  Tobacco Use  . Smoking status: Never Smoker  . Smokeless tobacco: Never Used  Substance and Sexual Activity  . Alcohol use: Yes    Alcohol/week: 0.0 standard drinks  . Drug use: No  . Sexual activity: Not on file  Lifestyle  . Physical activity    Days per week: Not on file    Minutes per session: Not on file  . Stress: Not on file  Relationships  . Social Herbalist on phone: Not on file    Gets together: Not on file    Attends religious service: Not on file    Active member of club or organization: Not on file    Attends meetings of clubs  or organizations: Not on file    Relationship status: Not on file  Other Topics Concern  . Not on file  Social History Narrative  . Not on file    Outpatient Encounter Medications as of 06/04/2019  Medication Sig  . ALPRAZolam (XANAX) 0.25 MG tablet TAKE (1) TABLET BY MOUTH ONCE DAILY AS NEEDED.  Marland Kitchen diphenhydrAMINE (BENADRYL) 25 MG tablet Take 25 mg by mouth as needed for itching.  . estradiol (ESTRACE) 0.1 MG/GM vaginal cream As directed.  . Ibuprofen-Diphenhydramine Cit (ADVIL PM PO) Take by mouth as needed.  . [DISCONTINUED] ALPRAZolam (XANAX) 0.25 MG tablet TAKE (1) TABLET BY MOUTH ONCE DAILY AS NEEDED.   No facility-administered encounter medications on file as of 06/04/2019.     Review of Systems  Constitutional: Negative for appetite change and unexpected weight change.  HENT: Negative for congestion and sinus pressure.   Eyes: Negative for pain and visual disturbance.  Respiratory: Negative for cough, chest tightness and shortness of breath.   Cardiovascular: Negative for chest  pain, palpitations and leg swelling.  Gastrointestinal: Negative for abdominal pain, diarrhea, nausea and vomiting.  Genitourinary: Negative for difficulty urinating and dysuria.  Musculoskeletal: Negative for joint swelling and myalgias.  Skin: Negative for color change and rash.  Neurological: Negative for dizziness, light-headedness and headaches.  Hematological: Negative for adenopathy. Does not bruise/bleed easily.  Psychiatric/Behavioral: Negative for agitation and dysphoric mood.       Increased stress as outlined.         Objective:    Physical Exam Constitutional:      General: She is not in acute distress.    Appearance: Normal appearance. She is well-developed.  HENT:     Right Ear: External ear normal.     Left Ear: External ear normal.  Eyes:     General: No scleral icterus.       Right eye: No discharge.        Left eye: No discharge.     Conjunctiva/sclera: Conjunctivae  normal.  Neck:     Musculoskeletal: Neck supple. No muscular tenderness.     Thyroid: No thyromegaly.  Cardiovascular:     Rate and Rhythm: Normal rate and regular rhythm.  Pulmonary:     Effort: No tachypnea, accessory muscle usage or respiratory distress.     Breath sounds: Normal breath sounds. No decreased breath sounds or wheezing.  Chest:     Breasts:        Right: No inverted nipple, mass, nipple discharge or tenderness (no axillary adenopathy).        Left: No inverted nipple, mass, nipple discharge or tenderness (no axilarry adenopathy).  Abdominal:     General: Bowel sounds are normal.     Palpations: Abdomen is soft.     Tenderness: There is no abdominal tenderness.  Genitourinary:    Comments: Normal external genitalia.  Vaginal vault without lesions.  Cervix identified.  Pap smear performed.  Could not appreciate any adnexal masses or tenderness.   Musculoskeletal:        General: No swelling or tenderness.  Lymphadenopathy:     Cervical: No cervical adenopathy.  Skin:    Findings: No erythema or rash.  Neurological:     Mental Status: She is alert and oriented to person, place, and time.  Psychiatric:        Mood and Affect: Mood normal.        Behavior: Behavior normal.     BP 124/84   Pulse 80   Temp 98.7 F (37.1 C) (Oral)   Resp 16   Wt 155 lb 12.8 oz (70.7 kg)   LMP 11/02/2010   SpO2 99%   BMI 25.15 kg/m  Wt Readings from Last 3 Encounters:  06/04/19 155 lb 12.8 oz (70.7 kg)  11/20/18 157 lb 12.8 oz (71.6 kg)  05/20/18 156 lb 3.2 oz (70.9 kg)     Lab Results  Component Value Date   WBC 8.5 06/02/2019   HGB 12.4 06/02/2019   HCT 36.7 06/02/2019   PLT 232.0 06/02/2019   GLUCOSE 97 06/02/2019   CHOL 183 06/02/2019   TRIG 168.0 (H) 06/02/2019   HDL 36.20 (L) 06/02/2019   LDLDIRECT 101.0 05/19/2018   LDLCALC 113 (H) 06/02/2019   ALT 20 06/02/2019   AST 19 06/02/2019   NA 137 06/02/2019   K 4.4 06/02/2019   CL 102 06/02/2019   CREATININE  0.83 06/02/2019   BUN 12 06/02/2019   CO2 27 06/02/2019   TSH 2.03 06/02/2019  Assessment & Plan:   Problem List Items Addressed This Visit    Anemia    Follow cbc.       Elevated blood pressure reading    Recheck improved.  Follow.        Health care maintenance    Physical today 06/04/19.  Colonoscopy - no polyps.  Recommended f/u in 5 years.  Due 2024.  Mammogram 08/19/18 - Birads I.  PAP 06/04/19.        Hypercholesterolemia    Low cholesterold diet and exercise.  Follow lipid panel.       Relevant Orders   Hepatic function panel   Lipid panel   Basic metabolic panel   Stress    Daughter doing better.  Still with increased stress related to covid, etc.  Discussed with her today.  She feels she is handling things relatively well.  Follow.         Other Visit Diagnoses    Visit for screening mammogram    -  Primary   Cervical cancer screening       Relevant Orders   Cytology - PAP( [place])       Einar Pheasant, MD

## 2019-06-07 ENCOUNTER — Encounter: Payer: Self-pay | Admitting: Internal Medicine

## 2019-06-07 NOTE — Assessment & Plan Note (Signed)
Recheck improved.  Follow.   

## 2019-06-07 NOTE — Assessment & Plan Note (Signed)
Daughter doing better.  Still with increased stress related to covid, etc.  Discussed with her today.  She feels she is handling things relatively well.  Follow.

## 2019-06-07 NOTE — Assessment & Plan Note (Signed)
Low cholesterold diet and exercise.  Follow lipid panel.

## 2019-06-07 NOTE — Assessment & Plan Note (Signed)
Physical today 06/04/19.  Colonoscopy - no polyps.  Recommended f/u in 5 years.  Due 2024.  Mammogram 08/19/18 - Birads I.  PAP 06/04/19.

## 2019-06-07 NOTE — Assessment & Plan Note (Signed)
Follow cbc.  

## 2019-06-09 ENCOUNTER — Encounter: Payer: Self-pay | Admitting: Internal Medicine

## 2019-06-09 LAB — CYTOLOGY - PAP
Diagnosis: NEGATIVE
HPV: NOT DETECTED

## 2019-07-28 DIAGNOSIS — L570 Actinic keratosis: Secondary | ICD-10-CM | POA: Diagnosis not present

## 2019-07-28 DIAGNOSIS — D225 Melanocytic nevi of trunk: Secondary | ICD-10-CM | POA: Diagnosis not present

## 2019-07-28 DIAGNOSIS — D2272 Melanocytic nevi of left lower limb, including hip: Secondary | ICD-10-CM | POA: Diagnosis not present

## 2019-07-28 DIAGNOSIS — D2262 Melanocytic nevi of left upper limb, including shoulder: Secondary | ICD-10-CM | POA: Diagnosis not present

## 2019-07-28 DIAGNOSIS — Z85828 Personal history of other malignant neoplasm of skin: Secondary | ICD-10-CM | POA: Diagnosis not present

## 2019-09-07 ENCOUNTER — Other Ambulatory Visit: Payer: Self-pay

## 2019-09-07 ENCOUNTER — Encounter: Payer: Self-pay | Admitting: Internal Medicine

## 2019-09-07 ENCOUNTER — Ambulatory Visit (INDEPENDENT_AMBULATORY_CARE_PROVIDER_SITE_OTHER): Payer: Federal, State, Local not specified - PPO | Admitting: Internal Medicine

## 2019-09-07 DIAGNOSIS — F439 Reaction to severe stress, unspecified: Secondary | ICD-10-CM

## 2019-09-07 DIAGNOSIS — G479 Sleep disorder, unspecified: Secondary | ICD-10-CM | POA: Diagnosis not present

## 2019-09-07 MED ORDER — TRAZODONE HCL 50 MG PO TABS: 25.0000 mg | ORAL_TABLET | Freq: Every evening | ORAL | 1 refills | Status: DC | PRN

## 2019-09-07 NOTE — Progress Notes (Signed)
Patient ID: Kelly Tanner, female   DOB: 10/18/1961, 58 y.o.   MRN: [id number]   Virtual Visit via telephone Note  This visit type was conducted due to national recommendations for restrictions regarding the COVID-19 pandemic (e.g. social distancing).  This format is felt to be most appropriate for this patient at this time.  All issues noted in this document were discussed and addressed.  No physical exam was performed (except for noted visual exam findings with Video Visits).   I connected with Kelly Tanner by telephone and verified that I am speaking with the correct person using two identifiers. Location patient: home Location provider: work  Persons participating in the telephone visit: patient, provider  I discussed the limitations, risks, security and privacy concerns of performing an evaluation and management service by telephone and the availability of in person appointments.  The patient expressed understanding and agreed to proceed.   Reason for visit: work in appt  HPI: Work in appt to help her with her sleep issues.  She reports increased stress.  Has returned to work.  Working in the office.  Increased stress related to this and attempts at [place] restrictions.  Both of her daughters and their families had covid.  Doing well now.  She is trying to stay active.  No chest pain.  No sob. No acid reflux.  No abdominal pain.  She has been waking up and having trouble going back to sleep.  Has taken melatonin previously.  Helped at first.  Takes advil pm.  Does not want to take this on a regular basis.  Xanax helps, but she does not want to take this every night.  Previously was on trazodone.  Worked well for her when she took it.  Discussed treatment options. Needing something nightly.     ROS: See pertinent positives and negatives per HPI.  Past Medical History:  Diagnosis Date  . Basal cell carcinoma   . Hyperlipidemia     Past Surgical History:  Procedure Laterality Date  .  CYST REMOVAL NECK    . REFRACTIVE SURGERY    . SKIN CANCER EXCISION  2001   basal cell    Family History  Problem Relation Age of Onset  . Lung cancer Other        parent  . Colon cancer Other        parent    SOCIAL HX: reviewed.    Current Outpatient Medications:  .  ALPRAZolam (XANAX) 0.25 MG tablet, TAKE (1) TABLET BY MOUTH ONCE DAILY AS NEEDED., Disp: 30 tablet, Rfl: 0 .  estradiol (ESTRACE) 0.1 MG/GM vaginal cream, As directed., Disp: 42.5 g, Rfl: 1 .  traZODone (DESYREL) 50 MG tablet, Take 0.5-1 tablets (25-50 mg total) by mouth at bedtime as needed for sleep., Disp: 30 tablet, Rfl: 1  EXAM:  GENERAL: alert.  Sounds to be in no acute distress.  Answering questions appropriately.    PSYCH/NEURO: pleasant and cooperative, no obvious depression or anxiety, speech and thought processing grossly intact  ASSESSMENT AND PLAN:  Discussed the following assessment and plan:  Stress Increased stress as outlined.  Difficulty sleeping as outlined.  Trial of trazodone.    Sleep difficulties Sleep issues as outlined.  Has previously been on trazodone.  Tolerated.  Trial of trazodone as outlined.  Follow closely.  Notify me if persistent symptoms.      I discussed the assessment and treatment plan with the patient. The patient was provided an opportunity to ask questions  and all were answered. The patient agreed with the plan and demonstrated an understanding of the instructions.   The patient was advised to call back or seek an in-person evaluation if the symptoms worsen or if the condition fails to improve as anticipated.  I provided 15 minutes of non-face-to-face time during this encounter.   Einar Pheasant, MD

## 2019-09-07 NOTE — Assessment & Plan Note (Signed)
Increased stress as outlined.  Difficulty sleeping as outlined.  Trial of trazodone.

## 2019-09-07 NOTE — Assessment & Plan Note (Signed)
Sleep issues as outlined.  Has previously been on trazodone.  Tolerated.  Trial of trazodone as outlined.  Follow closely.  Notify me if persistent symptoms.

## 2019-09-14 DIAGNOSIS — Z1231 Encounter for screening mammogram for malignant neoplasm of breast: Secondary | ICD-10-CM | POA: Diagnosis not present

## 2019-09-14 LAB — HM MAMMOGRAPHY

## 2019-09-15 DIAGNOSIS — D485 Neoplasm of uncertain behavior of skin: Secondary | ICD-10-CM | POA: Diagnosis not present

## 2019-09-15 DIAGNOSIS — L820 Inflamed seborrheic keratosis: Secondary | ICD-10-CM | POA: Diagnosis not present

## 2019-09-17 ENCOUNTER — Encounter: Payer: Self-pay | Admitting: Internal Medicine

## 2019-10-13 ENCOUNTER — Ambulatory Visit (INDEPENDENT_AMBULATORY_CARE_PROVIDER_SITE_OTHER): Payer: Federal, State, Local not specified - PPO

## 2019-10-13 ENCOUNTER — Other Ambulatory Visit: Payer: Self-pay

## 2019-10-13 DIAGNOSIS — Z23 Encounter for immunization: Secondary | ICD-10-CM | POA: Diagnosis not present

## 2019-10-20 ENCOUNTER — Other Ambulatory Visit: Payer: Self-pay | Admitting: Internal Medicine

## 2019-12-01 ENCOUNTER — Encounter: Payer: Self-pay | Admitting: Internal Medicine

## 2019-12-01 ENCOUNTER — Ambulatory Visit
Admission: RE | Admit: 2019-12-01 | Discharge: 2019-12-01 | Disposition: A | Discharge status: Home / Self Care | Payer: Federal, State, Local not specified - PPO | Source: Ambulatory Visit | Attending: Internal Medicine | Admitting: Internal Medicine

## 2019-12-01 ENCOUNTER — Other Ambulatory Visit: Payer: Self-pay

## 2019-12-01 ENCOUNTER — Ambulatory Visit (INDEPENDENT_AMBULATORY_CARE_PROVIDER_SITE_OTHER): Payer: Federal, State, Local not specified - PPO | Admitting: Internal Medicine

## 2019-12-01 VITALS — BP 141/77 | Temp 98.3°F | Ht 66.0 in | Wt 152.0 lb

## 2019-12-01 DIAGNOSIS — M546 Pain in thoracic spine: Secondary | ICD-10-CM | POA: Insufficient documentation

## 2019-12-01 DIAGNOSIS — R197 Diarrhea, unspecified: Secondary | ICD-10-CM

## 2019-12-01 DIAGNOSIS — K828 Other specified diseases of gallbladder: Secondary | ICD-10-CM | POA: Diagnosis not present

## 2019-12-01 NOTE — Patient Instructions (Addendum)
Try Peptobismol  Hydration with water or low sugar gatorade   Cholelithiasis  Cholelithiasis is a form of gallbladder disease in which gallstones form in the gallbladder. The gallbladder is an organ that stores bile. Bile is made in the liver, and it helps to digest fats. Gallstones begin as small crystals and slowly grow into stones. They may cause no symptoms until the gallbladder tightens (contracts) and a gallstone is blocking the duct (gallbladder attack), which can cause pain. Cholelithiasis is also referred to as gallstones. There are two main types of gallstones:  Cholesterol stones. These are made of hardened cholesterol and are usually yellow-green in color. They are the most common type of gallstone. Cholesterol is a white, waxy, fat-like substance that is made in the liver.  Pigment stones. These are dark in color and are made of a red-yellow substance that forms when hemoglobin from red blood cells breaks down (bilirubin). What are the causes? This condition may be caused by an imbalance in the substances that bile is made of. This can happen if the bile:  Has too much bilirubin.  Has too much cholesterol.  Does not have enough bile salts. These salts help the body absorb and digest fats. In some cases, this condition can also be caused by the gallbladder not emptying completely or often enough. What increases the risk? The following factors may make you more likely to develop this condition:  Being female.  Having multiple pregnancies. Health care providers sometimes advise removing diseased gallbladders before future pregnancies.  Eating a diet that is heavy in fried foods, fat, and refined carbohydrates, like white bread and white rice.  Being obese.  Being older than age 76.  Prolonged use of medicines that contain female hormones (estrogen).  Having diabetes mellitus.  Rapidly losing weight.  Having a family history of gallstones.  Being of [place]  or [place] descent.  Having an intestinal disease such as Crohn disease.  Having metabolic syndrome.  Having cirrhosis.  Having severe types of anemia such as sickle cell anemia. What are the signs or symptoms? In most cases, there are no symptoms. These are known as silent gallstones. If a gallstone blocks the bile ducts, it can cause a gallbladder attack. The main symptom of a gallbladder attack is sudden pain in the upper right abdomen. The pain usually comes at night or after eating a large meal. The pain can last for one or several hours and can spread to the right shoulder or chest. If the bile duct is blocked for more than a few hours, it can cause infection or inflammation of the gallbladder, liver, or pancreas, which may cause:  Nausea.  Vomiting.  Abdominal pain that lasts for 5 hours or more.  Fever or chills.  Yellowing of the skin or the whites of the eyes (jaundice).  Dark urine.  Light-colored stools. How is this diagnosed? This condition may be diagnosed based on:  A physical exam.  Your medical history.  An ultrasound of your gallbladder.  CT scan.  MRI.  Blood tests to check for signs of infection or inflammation.  A scan of your gallbladder and bile ducts (biliary system) using nonharmful radioactive material and special cameras that can see the radioactive material (cholescintigram). This test checks to see how your gallbladder contracts and whether bile ducts are blocked.  Inserting a small tube with a camera on the end (endoscope) through your mouth to inspect bile ducts and check for blockages (endoscopic retrograde cholangiopancreatogram). How is  this treated? Treatment for gallstones depends on the severity of the condition. Silent gallstones do not need treatment. If the gallstones cause a gallbladder attack or other symptoms, treatment may be required. Options for treatment include:  Surgery to remove the gallbladder (cholecystectomy). This is  the most common treatment.  Medicines to dissolve gallstones. These are most effective at treating small gallstones. You may need to take medicines for up to 6-12 months.  Shock wave treatment (extracorporeal biliary lithotripsy). In this treatment, an ultrasound machine sends shock waves to the gallbladder to break gallstones into smaller pieces. These pieces can then be passed into the intestines or be dissolved by medicine. This is rarely used.  Removing gallstones through endoscopic retrograde cholangiopancreatogram. A small basket can be attached to the endoscope and used to capture and remove gallstones. Follow these instructions at home:  Take over-the-counter and prescription medicines only as told by your health care provider.  Maintain a healthy weight and follow a healthy diet. This includes: ? Reducing fatty foods, such as fried food. ? Reducing refined carbohydrates, like white bread and white rice. ? Increasing fiber. Aim for foods like almonds, fruit, and beans.  Keep all follow-up visits as told by your health care provider. This is important. Contact a health care provider if:  You think you have had a gallbladder attack.  You have been diagnosed with silent gallstones and you develop abdominal pain or indigestion. Get help right away if:  You have pain from a gallbladder attack that lasts for more than 2 hours.  You have abdominal pain that lasts for more than 5 hours.  You have a fever or chills.  You have persistent nausea and vomiting.  You develop jaundice.  You have dark urine or light-colored stools. Summary  Cholelithiasis (also called gallstones) is a form of gallbladder disease in which gallstones form in the gallbladder.  This condition is caused by an imbalance in the substances that make up bile. This can happen if the bile has too much cholesterol, too much bilirubin, or not enough bile salts.  You are more likely to develop this condition if  you are female, pregnant, using medicines with estrogen, obese, older than age 67, or have a family history of gallstones. You may also develop gallstones if you have diabetes, an intestinal disease, cirrhosis, or metabolic syndrome.  Treatment for gallstones depends on the severity of the condition. Silent gallstones do not need treatment.  If gallstones cause a gallbladder attack or other symptoms, treatment may be needed. The most common treatment is surgery to remove the gallbladder. This information is not intended to replace advice given to you by your health care provider. Make sure you discuss any questions you have with your health care provider. Document Released: 11/22/2005 Document Revised: 11/08/2017 Document Reviewed: 08/12/2016 Elsevier Patient Education  [place].  Diarrhea, Adult Diarrhea is frequent loose and watery bowel movements. Diarrhea can make you feel weak and cause you to become dehydrated. Dehydration can make you tired and thirsty, cause you to have a dry mouth, and decrease how often you urinate. Diarrhea typically lasts 2-3 days. However, it can last longer if it is a sign of something more serious. It is important to treat your diarrhea as told by your health care provider. Follow these instructions at home: Eating and drinking     Follow these recommendations as told by your health care provider:  Take an oral rehydration solution (ORS). This is an over-the-counter medicine that helps  return your body to its normal balance of nutrients and water. It is found at pharmacies and retail stores.  Drink plenty of fluids, such as water, ice chips, diluted fruit juice, and low-calorie sports drinks. You can drink milk also, if desired.  Avoid drinking fluids that contain a lot of sugar or caffeine, such as energy drinks, sports drinks, and soda.  Eat bland, easy-to-digest foods in small amounts as you are able. These foods include bananas, applesauce, rice,  lean meats, toast, and crackers.  Avoid alcohol.  Avoid spicy or fatty foods.  Medicines  Take over-the-counter and prescription medicines only as told by your health care provider.  If you were prescribed an antibiotic medicine, take it as told by your health care provider. Do not stop using the antibiotic even if you start to feel better. General instructions   Wash your hands often using soap and water. If soap and water are not available, use a hand sanitizer. Others in the household should wash their hands as well. Hands should be washed: ? After using the toilet or changing a diaper. ? Before preparing, cooking, or serving food. ? While caring for a sick person or while visiting someone in a hospital.  Drink enough fluid to keep your urine pale yellow.  Rest at home while you recover.  Watch your condition for any changes.  Take a warm bath to relieve any burning or pain from frequent diarrhea episodes.  Keep all follow-up visits as told by your health care provider. This is important. Contact a health care provider if:  You have a fever.  Your diarrhea gets worse.  You have new symptoms.  You cannot keep fluids down.  You feel light-headed or dizzy.  You have a headache.  You have muscle cramps. Get help right away if:  You have chest pain.  You feel extremely weak or you faint.  You have bloody or black stools or stools that look like tar.  You have severe pain, cramping, or bloating in your abdomen.  You have trouble breathing or you are breathing very quickly.  Your heart is beating very quickly.  Your skin feels cold and clammy.  You feel confused.  You have signs of dehydration, such as: ? Dark urine, very little urine, or no urine. ? Cracked lips. ? Dry mouth. ? Sunken eyes. ? Sleepiness. ? Weakness. Summary  Diarrhea is frequent loose and watery bowel movements. Diarrhea can make you feel weak and cause you to become  dehydrated.  Drink enough fluids to keep your urine pale yellow.  Make sure that you wash your hands after using the toilet. If soap and water are not available, use hand sanitizer.  Contact a health care provider if your diarrhea gets worse or you have new symptoms.  Get help right away if you have signs of dehydration. This information is not intended to replace advice given to you by your health care provider. Make sure you discuss any questions you have with your health care provider. Document Released: 11/16/2002 Document Revised: 05/02/2018 Document Reviewed: 05/02/2018 Elsevier Patient Education  2020 [place] Diet A bland diet consists of foods that are often soft and do not have a lot of fat, fiber, or extra seasonings. Foods without fat, fiber, or seasoning are easier for the body to digest. They are also less likely to irritate your mouth, throat, stomach, and other parts of your digestive system. A bland diet is sometimes called a BRAT diet. What  is my plan? Your health care provider or food and nutrition specialist (dietitian) may recommend specific changes to your diet to prevent symptoms or to treat your symptoms. These changes may include:  Eating small meals often.  Cooking food until it is soft enough to chew easily.  Chewing your food well.  Drinking fluids slowly.  Not eating foods that are very spicy, sour, or fatty.  Not eating citrus fruits, such as oranges and grapefruit. What do I need to know about this diet?  Eat a variety of foods from the bland diet food list.  Do not follow a bland diet longer than needed.  Ask your health care provider whether you should take vitamins or supplements. What foods can I eat? Grains  Hot cereals, such as cream of wheat. Rice. Bread, crackers, or tortillas made from refined white flour. Vegetables Canned or cooked vegetables. Mashed or boiled potatoes. Fruits  Bananas. Applesauce. Other types of  cooked or canned fruit with the skin and seeds removed, such as canned peaches or pears. Meats and other proteins  Scrambled eggs. Creamy peanut butter or other nut butters. Lean, well-cooked meats, such as chicken or fish. Tofu. Soups or broths. Dairy Low-fat dairy products, such as milk, cottage cheese, or yogurt. Beverages  Water. Herbal tea. Apple juice. Fats and oils Mild salad dressings. Canola or olive oil. Sweets and desserts Pudding. Custard. Fruit gelatin. Ice cream. The items listed above may not be a complete list of recommended foods and beverages. Contact a dietitian for more options. What foods are not recommended? Grains Whole grain breads and cereals. Vegetables Raw vegetables. Fruits Raw fruits, especially citrus, berries, or dried fruits. Dairy Whole fat dairy foods. Beverages Caffeinated drinks. Alcohol. Seasonings and condiments Strongly flavored seasonings or condiments. Hot sauce. Salsa. Other foods Spicy foods. Fried foods. Sour foods, such as pickled or fermented foods. Foods with high sugar content. Foods high in fiber. The items listed above may not be a complete list of foods and beverages to avoid. Contact a dietitian for more information. Summary  A bland diet consists of foods that are often soft and do not have a lot of fat, fiber, or extra seasonings.  Foods without fat, fiber, or seasoning are easier for the body to digest.  Check with your health care provider to see how long you should follow this diet plan. It is not meant to be followed for long periods. This information is not intended to replace advice given to you by your health care provider. Make sure you discuss any questions you have with your health care provider. Document Released: 03/19/2016 Document Revised: 12/25/2017 Document Reviewed: 12/25/2017 Elsevier Patient Education  2020 [employer].

## 2019-12-01 NOTE — Progress Notes (Signed)
Virtual Visit via Video Note  I connected with Kelly Tanner   on 12/01/19 at  2:40 PM EST by a video enabled telemedicine application and verified that I am speaking with the correct person using two identifiers.  Location patient: home Location provider:work or home office Persons participating in the virtual visit: patient, provider  I discussed the limitations of evaluation and management by telemedicine and the availability of in person appointments. The patient expressed understanding and agreed to proceed.   HPI: 1. Sunday 11/29/19 for 15-20 minutes c/o right shoulder blade pain 7/10 like a pulled muscle intense pain and grandkids had been at her home so she was thinking was a pulled muscle pain was intense tried advil w/o relief after 30-45 minutes pain eased and Sunday night she had fried seafood platter with return of pain on Monday had bland foods and she was ok w/o pain but had watery diarrhea about 10 episodes since Sunday and now just urgency but nothing coming out. No nausea/vomiting or fever. Now pain is a twinge but unable to rate out of 10 scale today but reduced.   Today she had country ham on roll this am 8:30 am and water since then  She denies abx she has not had in 2 years since last had C diff  Denies ab pain, cough, wheezing temp today was 98.3 She reports daughter had covid 2 months ago and was around her mother in law ~2 weeks ago who now has covid   ROS: See pertinent positives and negatives per HPI.  Past Medical History:  Diagnosis Date  . Basal cell carcinoma   . Hyperlipidemia     Past Surgical History:  Procedure Laterality Date  . CYST REMOVAL NECK    . REFRACTIVE SURGERY    . SKIN CANCER EXCISION  2001   basal cell    Family History  Problem Relation Age of Onset  . Lung cancer Other        parent  . Colon cancer Other        parent    SOCIAL HX:  Lives at home in [place].  Married with kids    Current Outpatient Medications:   .  ALPRAZolam (XANAX) 0.25 MG tablet, TAKE (1) TABLET BY MOUTH ONCE DAILY AS NEEDED., Disp: 30 tablet, Rfl: 0 .  estradiol (ESTRACE) 0.1 MG/GM vaginal cream, As directed., Disp: 42.5 g, Rfl: 1 .  traZODone (DESYREL) 50 MG tablet, TAKE 1/2 TO 1 TABLET BY MOUTH AT BEDTIME AS NEEDED FOR SLEEP, Disp: 30 tablet, Rfl: 0  EXAM:  VITALS per patient if applicable:  GENERAL: alert, oriented, appears well and in no acute distress  HEENT: atraumatic, conjunttiva clear, no obvious abnormalities on inspection of external nose and ears  NECK: normal movements of the head and neck  LUNGS: on inspection no signs of respiratory distress, breathing rate appears normal, no obvious gross SOB, gasping or wheezing  CV: no obvious cyanosis  MS: moves all visible extremities without noticeable abnormality  PSYCH/NEURO: pleasant and cooperative, no obvious depression or anxiety, speech and thought processing grossly intact  ASSESSMENT AND PLAN:  Discussed the following assessment and plan:  Diarrhea, with right upper back pain ddx r/o Gallbladder d/o with sx's provoked by fatty meal, Other ddx Cdiff with h/o Cdiff, covid 19 (though exposure is indirect and remote could be less likely), kidney stones - Plan: Comprehensive metabolic panel, CBC with Differential/Platelet, Urinalysis, Routine w reflex microscopic [facility]  US Abdomen Complete stat BRAT diet  Tylenol  peptobismol  If symptoms continue will rec stool culture/Cdiff with history and COVID 19 r/o though pt is not having other sx's of covid 19 currently  Hydration with water/gatorade low sugar  Disc with pt if no etiology will have to rule out other causes  She already takes probiotics   -we discussed possible serious and likely etiologies, options for evaluation and workup, limitations of telemedicine visit vs in person visit, treatment, treatment risks and precautions. Pt prefers to treat via telemedicine empirically rather then risking or  undertaking an in person visit at this moment. Patient agrees to seek prompt in person care if worsening, new symptoms arise, or if is not improving with treatment.   I discussed the assessment and treatment plan with the patient. The patient was provided an opportunity to ask questions and all were answered. The patient agreed with the plan and demonstrated an understanding of the instructions.   The patient was advised to call back or seek an in-person evaluation if the symptoms worsen or if the condition fails to improve as anticipated.  Time spent 25 minutes  Delorise Jackson, MD

## 2019-12-02 LAB — CBC WITH DIFFERENTIAL/PLATELET
Basophils Absolute: 0 10*3/uL (ref 0.0–0.2)
Basos: 0 %
EOS (ABSOLUTE): 0.1 10*3/uL (ref 0.0–0.4)
Eos: 0 %
Hematocrit: 40.4 % (ref 34.0–46.6)
Hemoglobin: 13.6 g/dL (ref 11.1–15.9)
Immature Grans (Abs): 0 10*3/uL (ref 0.0–0.1)
Immature Granulocytes: 0 %
Lymphocytes Absolute: 1.5 10*3/uL (ref 0.7–3.1)
Lymphs: 12 %
MCH: 29.6 pg (ref 26.6–33.0)
MCHC: 33.7 g/dL (ref 31.5–35.7)
MCV: 88 fL (ref 79–97)
Monocytes Absolute: 0.5 10*3/uL (ref 0.1–0.9)
Monocytes: 4 %
Neutrophils Absolute: 10.8 10*3/uL — ABNORMAL HIGH (ref 1.4–7.0)
Neutrophils: 84 %
Platelets: 308 10*3/uL (ref 150–450)
RBC: 4.59 x10E6/uL (ref 3.77–5.28)
RDW: 13.2 % (ref 11.7–15.4)
WBC: 12.9 10*3/uL — ABNORMAL HIGH (ref 3.4–10.8)

## 2019-12-02 LAB — URINALYSIS, ROUTINE W REFLEX MICROSCOPIC
Bilirubin, UA: NEGATIVE
Glucose, UA: NEGATIVE
Ketones, UA: NEGATIVE
Leukocytes,UA: NEGATIVE
Nitrite, UA: NEGATIVE
Specific Gravity, UA: 1.017 (ref 1.005–1.030)
Urobilinogen, Ur: 0.2 mg/dL (ref 0.2–1.0)
pH, UA: 5.5 (ref 5.0–7.5)

## 2019-12-02 LAB — COMPREHENSIVE METABOLIC PANEL
ALT: 16 IU/L (ref 0–32)
AST: 17 IU/L (ref 0–40)
Albumin/Globulin Ratio: 1.4 (ref 1.2–2.2)
Albumin: 4.6 g/dL (ref 3.8–4.9)
Alkaline Phosphatase: 142 IU/L — ABNORMAL HIGH (ref 39–117)
BUN/Creatinine Ratio: 10 (ref 9–23)
BUN: 8 mg/dL (ref 6–24)
Bilirubin Total: 0.5 mg/dL (ref 0.0–1.2)
CO2: 21 mmol/L (ref 20–29)
Calcium: 10 mg/dL (ref 8.7–10.2)
Chloride: 104 mmol/L (ref 96–106)
Creatinine, Ser: 0.84 mg/dL (ref 0.57–1.00)
GFR calc Af Amer: 89 mL/min/{1.73_m2} (ref >59)
GFR calc non Af Amer: 77 mL/min/{1.73_m2} (ref >59)
Globulin, Total: 3.4 g/dL (ref 1.5–4.5)
Glucose: 119 mg/dL — ABNORMAL HIGH (ref 65–99)
Potassium: 4.3 mmol/L (ref 3.5–5.2)
Sodium: 141 mmol/L (ref 134–144)
Total Protein: 8 g/dL (ref 6.0–8.5)

## 2019-12-02 LAB — MICROSCOPIC EXAMINATION
Bacteria, UA: NONE SEEN
Casts: NONE SEEN /lpf

## 2019-12-02 NOTE — Telephone Encounter (Signed)
Pt wants a call back for lab results. Unable to interpret comments in MyChart.

## 2019-12-03 ENCOUNTER — Encounter: Payer: Self-pay | Admitting: Internal Medicine

## 2019-12-13 NOTE — Telephone Encounter (Signed)
Please schedule her an appt to discuss her gallbladder issues.  Thanks

## 2019-12-15 ENCOUNTER — Ambulatory Visit (INDEPENDENT_AMBULATORY_CARE_PROVIDER_SITE_OTHER): Payer: Federal, State, Local not specified - PPO | Admitting: Internal Medicine

## 2019-12-15 ENCOUNTER — Other Ambulatory Visit: Payer: Self-pay

## 2019-12-15 ENCOUNTER — Other Ambulatory Visit: Payer: Federal, State, Local not specified - PPO

## 2019-12-15 DIAGNOSIS — R1011 Right upper quadrant pain: Secondary | ICD-10-CM | POA: Diagnosis not present

## 2019-12-15 DIAGNOSIS — R197 Diarrhea, unspecified: Secondary | ICD-10-CM

## 2019-12-15 DIAGNOSIS — D649 Anemia, unspecified: Secondary | ICD-10-CM

## 2019-12-15 DIAGNOSIS — D72829 Elevated white blood cell count, unspecified: Secondary | ICD-10-CM | POA: Diagnosis not present

## 2019-12-15 DIAGNOSIS — M25511 Pain in right shoulder: Secondary | ICD-10-CM

## 2019-12-15 NOTE — Progress Notes (Signed)
Patient ID: Kelly Tanner, female   DOB: January 28, 1961, 59 y.o.   MRN: [id number]   Virtual Visit via video Note  This visit type was conducted due to national recommendations for restrictions regarding the COVID-19 pandemic (e.g. social distancing).  This format is felt to be most appropriate for this patient at this time.  All issues noted in this document were discussed and addressed.  No physical exam was performed (except for noted visual exam findings with Video Visits).   I connected with Kelly Tanner by a video enabled telemedicine application and verified that I am speaking with the correct person using two identifiers. Location patient: home Location provider: work Persons participating in the virtual visit: patient, provider  The limitations, risks, security and privacy concerns of performing an evaluation and management service by telephone and the availability of in person appointments have been discussed.  The patient expressed understanding and agreed to proceed.   Reason for visit:  Work in appt  HPI: Was evaluated on 12/01/19 for right shoulder blade pain, right upper quadrant pain and diarrhea.  Reviewed Dr Aundra Dubin- Scocuzza's note.  Pain had improved on 12/01/19 - after starting on 11/29/19.  Labs and ultrasound ordered.  Metabolic panel revealed slightly elevated alkaline phosphatase.  White count slightly elevated - 12.9.  Urinalysis - trace rbc.  Abdominal ultrasound reviewed and revealed small volume gallbladder sludge.  No evidence of acute cholecystitis.  She reports she does feel better.  Has been careful - watching what she eats.  No nausea or vomiting.  Still having diarrhea and some soreness.  The initial episode - she described increased pain and having a hard time getting comfortable.  Prior to the flare, she ate steak, snacks, pizza, some seafood and minimal amount of wine.  No documented fever.  Again, overall feeling better, but wanted to discuss further evaluation  and w/up and go over labs and ultrasound results.     ROS: See pertinent positives and negatives per HPI.  Past Medical History:  Diagnosis Date   Basal cell carcinoma    Hyperlipidemia     Past Surgical History:  Procedure Laterality Date   CYST REMOVAL NECK     REFRACTIVE SURGERY     SKIN CANCER EXCISION  2001   basal cell    Family History  Problem Relation Age of Onset   Lung cancer Other        parent   Colon cancer Other        parent    SOCIAL HX: reviewed.    Current Outpatient Medications:    ALPRAZolam (XANAX) 0.25 MG tablet, TAKE (1) TABLET BY MOUTH ONCE DAILY AS NEEDED., Disp: 30 tablet, Rfl: 0   estradiol (ESTRACE) 0.1 MG/GM vaginal cream, As directed., Disp: 42.5 g, Rfl: 1   traZODone (DESYREL) 50 MG tablet, TAKE 1/2 TO 1 TABLET BY MOUTH AT BEDTIME AS NEEDED FOR SLEEP, Disp: 30 tablet, Rfl: 0  EXAM:  GENERAL: alert, oriented, appears well and in no acute distress  HEENT: atraumatic, conjunttiva clear, no obvious abnormalities on inspection of external nose and ears  NECK: normal movements of the head and neck  LUNGS: on inspection no signs of respiratory distress, breathing rate appears normal, no obvious gross SOB, gasping or wheezing  CV: no obvious cyanosis  PSYCH/NEURO: pleasant and cooperative, no obvious depression or anxiety, speech and thought processing grossly intact  ASSESSMENT AND PLAN:  Discussed the following assessment and plan:  RUQ pain Recent RUQ pain with associated  right shoulder pain and diarrhea.  Started after eating steak, pizza, fried seafood.  Labs reviewed.  Abdominal ultrasound revealed gallbladder sludge.  Is better now, but she has been very careful what she has been eating.  Still with some diarrhea.  Probiotics.  Discussed further evaluation of gallbladder.  Discussed HIDA scan and/or referral to surgery.  Would like to have surgery opinion.  She is in agreement.    Diarrhea Has had previous issues with  c.diff diarrhea.  No abx.  Diarrhea now occurred in setting of increased pain as outlined.  Abdominal ultrasound as outlined.  Probiotics.  Refer to surgery.    Anemia Documented history of anemia.  hgb normal 12/01/19.    Leukocytosis Had questions about elevated white blood cell count.  Feel related to the acute episode.  Will plan for f/u cbc in future to confirm normalized.    Right shoulder pain Feel related to her gallbladder.  Abdominal ultrasound as outlined.  Plan for f/u with surgery as discussed.     Orders Placed This Encounter  Procedures   Ambulatory referral to General Surgery    Referral Priority:   Routine    Referral Type:   Surgical    Referral Reason:   Specialty Services Required    Requested Specialty:   General Surgery    Number of Visits Requested:   1     I discussed the assessment and treatment plan with the patient. The patient was provided an opportunity to ask questions and all were answered. The patient agreed with the plan and demonstrated an understanding of the instructions.   The patient was advised to call back or seek an in-person evaluation if the symptoms worsen or if the condition fails to improve as anticipated.   Einar Pheasant, MD

## 2019-12-16 ENCOUNTER — Other Ambulatory Visit: Payer: Self-pay | Admitting: Internal Medicine

## 2019-12-17 ENCOUNTER — Ambulatory Visit: Payer: Federal, State, Local not specified - PPO | Admitting: Internal Medicine

## 2019-12-20 ENCOUNTER — Encounter: Payer: Self-pay | Admitting: Internal Medicine

## 2019-12-20 DIAGNOSIS — M25511 Pain in right shoulder: Secondary | ICD-10-CM | POA: Insufficient documentation

## 2019-12-20 DIAGNOSIS — R1011 Right upper quadrant pain: Secondary | ICD-10-CM | POA: Insufficient documentation

## 2019-12-20 DIAGNOSIS — D72829 Elevated white blood cell count, unspecified: Secondary | ICD-10-CM | POA: Insufficient documentation

## 2019-12-20 NOTE — Assessment & Plan Note (Signed)
Has had previous issues with c.diff diarrhea.  No abx.  Diarrhea now occurred in setting of increased pain as outlined.  Abdominal ultrasound as outlined.  Probiotics.  Refer to surgery.

## 2019-12-20 NOTE — Assessment & Plan Note (Signed)
Had questions about elevated white blood cell count.  Feel related to the acute episode.  Will plan for f/u cbc in future to confirm normalized.

## 2019-12-20 NOTE — Assessment & Plan Note (Signed)
Feel related to her gallbladder.  Abdominal ultrasound as outlined.  Plan for f/u with surgery as discussed.

## 2019-12-20 NOTE — Assessment & Plan Note (Signed)
Recent RUQ pain with associated right shoulder pain and diarrhea.  Started after eating steak, pizza, fried seafood.  Labs reviewed.  Abdominal ultrasound revealed gallbladder sludge.  Is better now, but she has been very careful what she has been eating.  Still with some diarrhea.  Probiotics.  Discussed further evaluation of gallbladder.  Discussed HIDA scan and/or referral to surgery.  Would like to have surgery opinion.  She is in agreement.

## 2019-12-20 NOTE — Assessment & Plan Note (Signed)
Documented history of anemia.  hgb normal 12/01/19.

## 2019-12-21 ENCOUNTER — Telehealth: Payer: Self-pay | Admitting: Internal Medicine

## 2019-12-21 ENCOUNTER — Other Ambulatory Visit: Payer: Self-pay

## 2019-12-21 MED ORDER — TRAZODONE HCL 50 MG PO TABS: 25.0000 mg | ORAL_TABLET | Freq: Every evening | ORAL | 0 refills | Status: DC | PRN

## 2019-12-21 NOTE — Telephone Encounter (Signed)
Pt called and said she had a virtual last week. Dr. Nicki Reaper was suppose to renewal her current medication and did not. Pt also was making sure a referral was put in.

## 2019-12-21 NOTE — Telephone Encounter (Signed)
Rx sent in. Referral was placed. Xanax waiting for approval. Will update pt

## 2019-12-21 NOTE — Telephone Encounter (Signed)
rx ok'd for xanax #30 with no refills.   

## 2019-12-21 NOTE — Telephone Encounter (Signed)
Last OV 12/15/19 Next OV  Last refill 06/04/19

## 2019-12-22 NOTE — Telephone Encounter (Signed)
Patient is aware 

## 2019-12-23 DIAGNOSIS — K8020 Calculus of gallbladder without cholecystitis without obstruction: Secondary | ICD-10-CM | POA: Diagnosis not present

## 2019-12-24 ENCOUNTER — Other Ambulatory Visit: Payer: Federal, State, Local not specified - PPO

## 2019-12-28 ENCOUNTER — Ambulatory Visit: Payer: Federal, State, Local not specified - PPO | Admitting: Internal Medicine

## 2020-03-01 ENCOUNTER — Other Ambulatory Visit: Payer: Self-pay

## 2020-03-01 ENCOUNTER — Ambulatory Visit: Payer: Federal, State, Local not specified - PPO | Admitting: Internal Medicine

## 2020-03-01 DIAGNOSIS — G479 Sleep disorder, unspecified: Secondary | ICD-10-CM

## 2020-03-01 DIAGNOSIS — F439 Reaction to severe stress, unspecified: Secondary | ICD-10-CM

## 2020-03-01 DIAGNOSIS — D649 Anemia, unspecified: Secondary | ICD-10-CM

## 2020-03-01 DIAGNOSIS — E7800 Pure hypercholesterolemia, unspecified: Secondary | ICD-10-CM

## 2020-03-01 DIAGNOSIS — D72829 Elevated white blood cell count, unspecified: Secondary | ICD-10-CM | POA: Diagnosis not present

## 2020-03-01 MED ORDER — HYDROXYZINE HCL 10 MG PO TABS: 10.0000 mg | ORAL_TABLET | Freq: Every evening | ORAL | 0 refills | Status: DC | PRN

## 2020-03-01 NOTE — Progress Notes (Signed)
Patient ID: Kelly Tanner, female   DOB: 12/03/1961, 59 y.o.   MRN: [id number]   Subjective:    Patient ID: Kelly Tanner, female    DOB: 1961-12-08, 59 y.o.   MRN: [id number]  HPI  Patient here for a scheduled follow up.  She reports she is doing relatively well.  Increased stress.  Father - hip fracture.  Overall she feels she is handling things relatively well.  Has had problems sleeping.  Has tried melatonin gummies.  Was on trazodone.  Felt caused nose to be stopped up.  Has been off for 3-4 weeks now.  Nose is better.  Sleep is an issue now. Discussed treatment options.  No chest pain or sob reported. No abdominal pain or bowel change reported.    Past Medical History:  Diagnosis Date  . Basal cell carcinoma   . Hyperlipidemia    Past Surgical History:  Procedure Laterality Date  . CYST REMOVAL NECK    . REFRACTIVE SURGERY    . SKIN CANCER EXCISION  2001   basal cell   Family History  Problem Relation Age of Onset  . Lung cancer Other        parent  . Colon cancer Other        parent   Social History   Socioeconomic History  . Marital status: Married    Spouse name: Not on file  . Number of children: 2  . Years of education: Not on file  . Highest education level: Not on file  Occupational History  . Not on file  Tobacco Use  . Smoking status: Never Smoker  . Smokeless tobacco: Never Used  Substance and Sexual Activity  . Alcohol use: Yes    Alcohol/week: 0.0 standard drinks  . Drug use: No  . Sexual activity: Not on file  Other Topics Concern  . Not on file  Social History Narrative   Lives at home in [place].    Married with kids   Social Determinants of Health   Financial Resource Strain:   . Difficulty of Paying Living Expenses:   Food Insecurity:   . Worried About Charity fundraiser in the Last Year:   . Arboriculturist in the Last Year:   Transportation Needs:   . Film/video editor (Medical):   Marland Kitchen Lack of Transportation  (Non-Medical):   Physical Activity:   . Days of Exercise per Week:   . Minutes of Exercise per Session:   Stress:   . Feeling of Stress :   Social Connections:   . Frequency of Communication with Friends and Family:   . Frequency of Social Gatherings with Friends and Family:   . Attends Religious Services:   . Active Member of Clubs or Organizations:   . Attends Archivist Meetings:   Marland Kitchen Marital Status:     Outpatient Encounter Medications as of 03/01/2020  Medication Sig  . ALPRAZolam (XANAX) 0.25 MG tablet TAKE (1) TABLET BY MOUTH ONCE DAILY AS NEEDED.  Marland Kitchen estradiol (ESTRACE) 0.1 MG/GM vaginal cream As directed.  . hydrOXYzine (ATARAX/VISTARIL) 10 MG tablet Take 1 tablet (10 mg total) by mouth at bedtime as needed.  . [DISCONTINUED] traZODone (DESYREL) 50 MG tablet Take 0.5-1 tablets (25-50 mg total) by mouth at bedtime as needed. for sleep   No facility-administered encounter medications on file as of 03/01/2020.    Review of Systems  Constitutional: Negative for appetite change and unexpected weight change.  HENT:  Negative for congestion and sinus pressure.   Respiratory: Negative for cough, chest tightness and shortness of breath.   Cardiovascular: Negative for chest pain, palpitations and leg swelling.  Gastrointestinal: Negative for abdominal pain, diarrhea, nausea and vomiting.  Genitourinary: Negative for difficulty urinating and dysuria.  Musculoskeletal: Negative for joint swelling and myalgias.  Skin: Negative for color change and rash.  Neurological: Negative for dizziness, light-headedness and headaches.  Psychiatric/Behavioral: Positive for sleep disturbance. Negative for agitation and dysphoric mood.       Objective:    Physical Exam Constitutional:      General: She is not in acute distress.    Appearance: Normal appearance.  HENT:     Head: Normocephalic and atraumatic.     Right Ear: External ear normal.     Left Ear: External ear normal.    Eyes:     General: No scleral icterus.       Right eye: No discharge.        Left eye: No discharge.     Conjunctiva/sclera: Conjunctivae normal.  Neck:     Thyroid: No thyromegaly.  Cardiovascular:     Rate and Rhythm: Normal rate and regular rhythm.  Pulmonary:     Effort: No respiratory distress.     Breath sounds: Normal breath sounds. No wheezing.  Abdominal:     General: Bowel sounds are normal.     Palpations: Abdomen is soft.     Tenderness: There is no abdominal tenderness.  Musculoskeletal:        General: No swelling or tenderness.     Cervical back: Neck supple. No tenderness.  Lymphadenopathy:     Cervical: No cervical adenopathy.  Skin:    Findings: No erythema or rash.  Neurological:     Mental Status: She is alert.  Psychiatric:        Mood and Affect: Mood normal.        Behavior: Behavior normal.     BP 140/80   Pulse 90   Temp (!) 97.4 F (36.3 C)   Resp 16   Ht 5\' 6"  (1.676 m)   Wt 158 lb 3.2 oz (71.8 kg)   LMP 11/02/2010   SpO2 99%   BMI 25.53 kg/m  Wt Readings from Last 3 Encounters:  03/01/20 158 lb 3.2 oz (71.8 kg)  12/01/19 152 lb (68.9 kg)  06/04/19 155 lb 12.8 oz (70.7 kg)     Lab Results  Component Value Date   WBC 12.9 (H) 12/01/2019   HGB 13.6 12/01/2019   HCT 40.4 12/01/2019   PLT 308 12/01/2019   GLUCOSE 119 (H) 12/01/2019   CHOL 183 06/02/2019   TRIG 168.0 (H) 06/02/2019   HDL 36.20 (L) 06/02/2019   LDLDIRECT 101.0 05/19/2018   LDLCALC 113 (H) 06/02/2019   ALT 16 12/01/2019   AST 17 12/01/2019   NA 141 12/01/2019   K 4.3 12/01/2019   CL 104 12/01/2019   CREATININE 0.84 12/01/2019   BUN 8 12/01/2019   CO2 21 12/01/2019   TSH 2.03 06/02/2019    US Abdomen Complete  Result Date: 12/01/2019 CLINICAL DATA:  Right back pain, diarrhea EXAM: ABDOMEN ULTRASOUND COMPLETE COMPARISON:  None. FINDINGS: Gallbladder: No gallstones or wall thickening visualized. Sludge is present. No sonographic Murphy sign noted by  sonographer. Common bile duct: Diameter: 2 mm Liver: No focal lesion. Normal echogenicity. Portal vein is patent on color Doppler imaging with normal direction of blood flow towards the liver. IVC: No abnormality visualized.  Pancreas: Visualized portion unremarkable. Spleen: Size and appearance within normal limits. Right Kidney: Length: 12.5 cm. Echogenicity within normal limits. No mass or hydronephrosis visualized. Left Kidney: Length: 11.3 cm. Echogenicity within normal limits. No mass or hydronephrosis visualized. Abdominal aorta: No aneurysm visualized. Other findings: None. IMPRESSION: Small volume gallbladder sludge. No sonographic evidence of acute cholecystitis. Electronically Signed   By: Macy Mis M.D.   On: 12/01/2019 17:00       Assessment & Plan:   Problem List Items Addressed This Visit    Anemia    Follow cbc.  Last check wnl.       Hypercholesterolemia    Low cholesterol diet and exercise.  Follow lipid panel.       Relevant Orders   Comprehensive metabolic panel   Lipid panel   TSH   Leukocytosis    White blood cell count elevated on last check.  Recheck cbc with next labs.        Relevant Orders   CBC with Differential/Platelet   Urinalysis, Routine w reflex microscopic   Sleep difficulties    Sleep issues as outlined.  Treated previously with trazodone.  Off now.  Felt caused increased nasal congestion.  Discussed treatment options.  Trial of hydroxyzine.  Follow.        Stress    Increased stress as outlined.  Has xanax to take prn.  Follow.  Overall appears to be stable except for the sleep issue.  Follow.            Einar Pheasant, MD

## 2020-03-06 ENCOUNTER — Encounter: Payer: Self-pay | Admitting: Internal Medicine

## 2020-03-06 NOTE — Assessment & Plan Note (Signed)
White blood cell count elevated on last check.  Recheck cbc with next labs.

## 2020-03-06 NOTE — Assessment & Plan Note (Signed)
Follow cbc.  Last check wnl.

## 2020-03-06 NOTE — Assessment & Plan Note (Signed)
Sleep issues as outlined.  Treated previously with trazodone.  Off now.  Felt caused increased nasal congestion.  Discussed treatment options.  Trial of hydroxyzine.  Follow.

## 2020-03-06 NOTE — Assessment & Plan Note (Signed)
Increased stress as outlined.  Has xanax to take prn.  Follow.  Overall appears to be stable except for the sleep issue.  Follow.

## 2020-03-06 NOTE — Assessment & Plan Note (Signed)
Low cholesterol diet and exercise.  Follow lipid panel.   

## 2020-04-19 DIAGNOSIS — D2262 Melanocytic nevi of left upper limb, including shoulder: Secondary | ICD-10-CM | POA: Diagnosis not present

## 2020-04-19 DIAGNOSIS — D225 Melanocytic nevi of trunk: Secondary | ICD-10-CM | POA: Diagnosis not present

## 2020-04-19 DIAGNOSIS — D2261 Melanocytic nevi of right upper limb, including shoulder: Secondary | ICD-10-CM | POA: Diagnosis not present

## 2020-04-19 DIAGNOSIS — Z85828 Personal history of other malignant neoplasm of skin: Secondary | ICD-10-CM | POA: Diagnosis not present

## 2020-06-02 ENCOUNTER — Other Ambulatory Visit: Payer: Self-pay

## 2020-06-02 ENCOUNTER — Other Ambulatory Visit (INDEPENDENT_AMBULATORY_CARE_PROVIDER_SITE_OTHER): Payer: Federal, State, Local not specified - PPO

## 2020-06-02 DIAGNOSIS — D72829 Elevated white blood cell count, unspecified: Secondary | ICD-10-CM | POA: Diagnosis not present

## 2020-06-02 DIAGNOSIS — E7800 Pure hypercholesterolemia, unspecified: Secondary | ICD-10-CM | POA: Diagnosis not present

## 2020-06-02 LAB — CBC WITH DIFFERENTIAL/PLATELET
Basophils Absolute: 0.1 10*3/uL (ref 0.0–0.1)
Basophils Relative: 0.7 % (ref 0.0–3.0)
Eosinophils Absolute: 0.1 10*3/uL (ref 0.0–0.7)
Eosinophils Relative: 1.6 % (ref 0.0–5.0)
HCT: 36.8 % (ref 36.0–46.0)
Hemoglobin: 12.3 g/dL (ref 12.0–15.0)
Lymphocytes Relative: 19.2 % (ref 12.0–46.0)
Lymphs Abs: 1.4 10*3/uL (ref 0.7–4.0)
MCHC: 33.4 g/dL (ref 30.0–36.0)
MCV: 89.6 fl (ref 78.0–100.0)
Monocytes Absolute: 0.4 10*3/uL (ref 0.1–1.0)
Monocytes Relative: 5.8 % (ref 3.0–12.0)
Neutro Abs: 5.2 10*3/uL (ref 1.4–7.7)
Neutrophils Relative %: 72.7 % (ref 43.0–77.0)
Platelets: 216 10*3/uL (ref 150.0–400.0)
RBC: 4.11 Mil/uL (ref 3.87–5.11)
RDW: 14.7 % (ref 11.5–15.5)
WBC: 7.1 10*3/uL (ref 4.0–10.5)

## 2020-06-02 LAB — URINALYSIS, ROUTINE W REFLEX MICROSCOPIC
Bilirubin Urine: NEGATIVE
Ketones, ur: NEGATIVE
Leukocytes,Ua: NEGATIVE
Nitrite: NEGATIVE
Specific Gravity, Urine: 1.01 (ref 1.000–1.030)
Total Protein, Urine: NEGATIVE
Urine Glucose: NEGATIVE
Urobilinogen, UA: 0.2 (ref 0.0–1.0)
WBC, UA: NONE SEEN (ref 0–?)
pH: 7 (ref 5.0–8.0)

## 2020-06-02 LAB — COMPREHENSIVE METABOLIC PANEL
ALT: 22 U/L (ref 0–35)
AST: 24 U/L (ref 0–37)
Albumin: 4.7 g/dL (ref 3.5–5.2)
Alkaline Phosphatase: 108 U/L (ref 39–117)
BUN: 13 mg/dL (ref 6–23)
CO2: 27 mEq/L (ref 19–32)
Calcium: 10 mg/dL (ref 8.4–10.5)
Chloride: 101 mEq/L (ref 96–112)
Creatinine, Ser: 0.77 mg/dL (ref 0.40–1.20)
GFR: 76.63 mL/min (ref >60.00)
Glucose, Bld: 98 mg/dL (ref 70–99)
Potassium: 3.9 mEq/L (ref 3.5–5.1)
Sodium: 137 mEq/L (ref 135–145)
Total Bilirubin: 0.6 mg/dL (ref 0.2–1.2)
Total Protein: 8.1 g/dL (ref 6.0–8.3)

## 2020-06-02 LAB — TSH: TSH: 3.02 u[IU]/mL (ref 0.35–4.50)

## 2020-06-02 LAB — LIPID PANEL
Cholesterol: 182 mg/dL (ref 0–200)
HDL: 36.2 mg/dL — ABNORMAL LOW (ref >39.00)
LDL Cholesterol: 112 mg/dL — ABNORMAL HIGH (ref 0–99)
NonHDL: 145.98
Total CHOL/HDL Ratio: 5
Triglycerides: 170 mg/dL — ABNORMAL HIGH (ref 0.0–149.0)
VLDL: 34 mg/dL (ref 0.0–40.0)

## 2020-06-02 NOTE — Addendum Note (Signed)
Addended by: [name] on: 06/02/2020 08:15 AM   Modules accepted: Orders

## 2020-06-02 NOTE — Addendum Note (Signed)
Addended by: [name] on: 06/02/2020 08:16 AM   Modules accepted: Orders

## 2020-06-06 ENCOUNTER — Other Ambulatory Visit: Payer: Federal, State, Local not specified - PPO

## 2020-06-07 ENCOUNTER — Encounter: Payer: Self-pay | Admitting: Internal Medicine

## 2020-06-07 ENCOUNTER — Ambulatory Visit (INDEPENDENT_AMBULATORY_CARE_PROVIDER_SITE_OTHER): Payer: Federal, State, Local not specified - PPO | Admitting: Internal Medicine

## 2020-06-07 ENCOUNTER — Other Ambulatory Visit: Payer: Self-pay

## 2020-06-07 VITALS — BP 130/78 | HR 81 | Temp 98.3°F | Resp 16 | Ht 66.0 in | Wt 155.4 lb

## 2020-06-07 DIAGNOSIS — E7800 Pure hypercholesterolemia, unspecified: Secondary | ICD-10-CM | POA: Diagnosis not present

## 2020-06-07 DIAGNOSIS — F439 Reaction to severe stress, unspecified: Secondary | ICD-10-CM | POA: Diagnosis not present

## 2020-06-07 DIAGNOSIS — Z0000 Encounter for general adult medical examination without abnormal findings: Secondary | ICD-10-CM | POA: Diagnosis not present

## 2020-06-07 DIAGNOSIS — N3010 Interstitial cystitis (chronic) without hematuria: Secondary | ICD-10-CM | POA: Diagnosis not present

## 2020-06-07 DIAGNOSIS — D649 Anemia, unspecified: Secondary | ICD-10-CM

## 2020-06-07 DIAGNOSIS — R319 Hematuria, unspecified: Secondary | ICD-10-CM

## 2020-06-07 DIAGNOSIS — G479 Sleep disorder, unspecified: Secondary | ICD-10-CM

## 2020-06-07 MED ORDER — ALPRAZOLAM 0.25 MG PO TABS: ORAL_TABLET | ORAL | 0 refills | Status: DC

## 2020-06-07 NOTE — Assessment & Plan Note (Addendum)
The 10-year ASCVD risk score Mikey Bussing DC Brooke Bonito., et al., 2013) is: 3.8%   Values used to calculate the score:     Age: 59 years     Sex: Female     Is Non-Hispanic African American: No     Diabetic: No     Tobacco smoker: No     Systolic Blood Pressure: 917 mmHg     Is BP treated: No     HDL Cholesterol: 36.2 mg/dL     Total Cholesterol: 182 mg/dL  Discussed calculated cholesterol risk.  Low cholesterol diet and exercise.  Follow lipid panel.

## 2020-06-07 NOTE — Progress Notes (Signed)
Patient ID: Kelly Tanner, female   DOB: [birth year], 59 y.o.   MRN: [id number]   Subjective:    Patient ID: Kelly Tanner, female    DOB: [birth year], 59 y.o.   MRN: [id number]  HPI This visit occurred during the SARS-CoV-2 public health emergency.  Safety protocols were in place, including screening questions prior to the visit, additional usage of staff PPE, and extensive cleaning of exam room while observing appropriate contact time as indicated for disinfecting solutions.  Patient here for her physical exam. She reports she is doing relatively well.  Increased stress.  Father s/p hip fracture.  Doing better.  Still issues with sleeping.  Has tried melatonin and trazodone.  Discussed other treatment options.  Daughter is doing better.  Tries to stay active.  No chest pain or sob reported.  No abdominal pain or bowel change reported.  Discussed labs.  Discussed diet and exercise.     Past Medical History:  Diagnosis Date  . Basal cell carcinoma   . Hyperlipidemia    Past Surgical History:  Procedure Laterality Date  . CYST REMOVAL NECK    . REFRACTIVE SURGERY    . SKIN CANCER EXCISION  2001   basal cell   Family History  Problem Relation Age of Onset  . Lung cancer Other        parent  . Colon cancer Other        parent   Social History   Socioeconomic History  . Marital status: Married    Spouse name: Not on file  . Number of children: 2  . Years of education: Not on file  . Highest education level: Not on file  Occupational History  . Not on file  Tobacco Use  . Smoking status: Never Smoker  . Smokeless tobacco: Never Used  Substance and Sexual Activity  . Alcohol use: Yes    Alcohol/week: 0.0 standard drinks  . Drug use: No  . Sexual activity: Not on file  Other Topics Concern  . Not on file  Social History Narrative   Lives at home in [place].    Married with kids   Social Determinants of Health   Financial Resource Strain:   . Difficulty of Paying  Living Expenses:   Food Insecurity:   . Worried About Charity fundraiser in the Last Year:   . Arboriculturist in the Last Year:   Transportation Needs:   . Film/video editor (Medical):   Marland Kitchen Lack of Transportation (Non-Medical):   Physical Activity:   . Days of Exercise per Week:   . Minutes of Exercise per Session:   Stress:   . Feeling of Stress :   Social Connections:   . Frequency of Communication with Friends and Family:   . Frequency of Social Gatherings with Friends and Family:   . Attends Religious Services:   . Active Member of Clubs or Organizations:   . Attends Archivist Meetings:   Marland Kitchen Marital Status:     Outpatient Encounter Medications as of 06/07/2020  Medication Sig  . ALPRAZolam (XANAX) 0.25 MG tablet Take one tablet q day prn  . estradiol (ESTRACE) 0.1 MG/GM vaginal cream As directed.  . hydrOXYzine (ATARAX/VISTARIL) 10 MG tablet Take 1 tablet (10 mg total) by mouth at bedtime as needed.  . [DISCONTINUED] ALPRAZolam (XANAX) 0.25 MG tablet TAKE (1) TABLET BY MOUTH ONCE DAILY AS NEEDED.   No facility-administered encounter medications on file as of  06/07/2020.    Review of Systems  Constitutional: Negative for appetite change and unexpected weight change.  HENT: Negative for congestion and sinus pressure.   Eyes: Negative for pain and visual disturbance.  Respiratory: Negative for cough, chest tightness and shortness of breath.   Cardiovascular: Negative for chest pain, palpitations and leg swelling.  Gastrointestinal: Negative for abdominal pain, diarrhea, nausea and vomiting.  Genitourinary: Negative for difficulty urinating and dysuria.  Musculoskeletal: Negative for joint swelling and myalgias.  Skin: Negative for color change and rash.  Neurological: Negative for dizziness, light-headedness and headaches.  Hematological: Negative for adenopathy. Does not bruise/bleed easily.  Psychiatric/Behavioral: Negative for agitation and dysphoric mood.        Objective:    Physical Exam Constitutional:      General: She is not in acute distress.    Appearance: Normal appearance. She is well-developed.  HENT:     Head: Normocephalic and atraumatic.     Right Ear: External ear normal.     Left Ear: External ear normal.  Eyes:     General: No scleral icterus.       Right eye: No discharge.        Left eye: No discharge.     Conjunctiva/sclera: Conjunctivae normal.  Neck:     Thyroid: No thyromegaly.  Cardiovascular:     Rate and Rhythm: Normal rate and regular rhythm.  Pulmonary:     Effort: No tachypnea, accessory muscle usage or respiratory distress.     Breath sounds: Normal breath sounds. No decreased breath sounds or wheezing.  Chest:     Breasts:        Right: No inverted nipple, mass, nipple discharge or tenderness (no axillary adenopathy).        Left: No inverted nipple, mass, nipple discharge or tenderness (no axilarry adenopathy).  Abdominal:     General: Bowel sounds are normal.     Palpations: Abdomen is soft.     Tenderness: There is no abdominal tenderness.  Musculoskeletal:        General: No swelling or tenderness.     Cervical back: Neck supple. No tenderness.  Lymphadenopathy:     Cervical: No cervical adenopathy.  Skin:    Findings: No erythema or rash.  Neurological:     Mental Status: She is alert and oriented to person, place, and time.  Psychiatric:        Mood and Affect: Mood normal.        Behavior: Behavior normal.     BP 130/78   Pulse 81   Temp 98.3 F (36.8 C)   Resp 16   Ht 5\' 6"  (1.676 m)   Wt 155 lb 6.4 oz (70.5 kg)   LMP 11/02/2010   SpO2 99%   BMI 25.08 kg/m  Wt Readings from Last 3 Encounters:  06/07/20 155 lb 6.4 oz (70.5 kg)  03/01/20 158 lb 3.2 oz (71.8 kg)  12/01/19 152 lb (68.9 kg)     Lab Results  Component Value Date   WBC 7.1 06/02/2020   HGB 12.3 06/02/2020   HCT 36.8 06/02/2020   PLT 216.0 06/02/2020   GLUCOSE 98 06/02/2020   CHOL 182 06/02/2020    TRIG 170.0 (H) 06/02/2020   HDL 36.20 (L) 06/02/2020   LDLDIRECT 101.0 05/19/2018   LDLCALC 112 (H) 06/02/2020   ALT 22 06/02/2020   AST 24 06/02/2020   NA 137 06/02/2020   K 3.9 06/02/2020   CL 101 06/02/2020   CREATININE  0.77 06/02/2020   BUN 13 06/02/2020   CO2 27 06/02/2020   TSH 3.02 06/02/2020    US Abdomen Complete  Result Date: 12/01/2019 CLINICAL DATA:  Right back pain, diarrhea EXAM: ABDOMEN ULTRASOUND COMPLETE COMPARISON:  None. FINDINGS: Gallbladder: No gallstones or wall thickening visualized. Sludge is present. No sonographic Murphy sign noted by sonographer. Common bile duct: Diameter: 2 mm Liver: No focal lesion. Normal echogenicity. Portal vein is patent on color Doppler imaging with normal direction of blood flow towards the liver. IVC: No abnormality visualized. Pancreas: Visualized portion unremarkable. Spleen: Size and appearance within normal limits. Right Kidney: Length: 12.5 cm. Echogenicity within normal limits. No mass or hydronephrosis visualized. Left Kidney: Length: 11.3 cm. Echogenicity within normal limits. No mass or hydronephrosis visualized. Abdominal aorta: No aneurysm visualized. Other findings: None. IMPRESSION: Small volume gallbladder sludge. No sonographic evidence of acute cholecystitis. Electronically Signed   By: Macy Mis M.D.   On: 12/01/2019 17:00       Assessment & Plan:   Problem List Items Addressed This Visit    Anemia    Follow cbc.       Health care maintenance    Physical today 06/07/20.  Colonoscopy 01/20/18.  Recommended f/u in 5 years.  Mammogram 09/14/19 - birads I.       Hematuria    Was previously worked up by Dr Jacqlyn Larsen.  Myoglobinuria.  Plan for f/u urinalysis.        Relevant Orders   Urinalysis, Routine w reflex microscopic   Hypercholesterolemia    The 10-year ASCVD risk score Mikey Bussing DC Jr., et al., 2013) is: 3.8%   Values used to calculate the score:     Age: 10 years     Sex: Female     Is Non-Hispanic African  American: No     Diabetic: No     Tobacco smoker: No     Systolic Blood Pressure: 301 mmHg     Is BP treated: No     HDL Cholesterol: 36.2 mg/dL     Total Cholesterol: 182 mg/dL  Discussed calculated cholesterol risk.  Low cholesterol diet and exercise.  Follow lipid panel.       Interstitial cystitis    Worked up by urology.  Stable.       Sleep difficulties    As outlined.  Trial of hydroxyzine.  Follow.        Stress    Increased stress as outlined.  Overall appeats to be handling things relatively well.  Not sleeping well.  Has tried melatonin and trazodone.  Discussed a trial of hydroxyzine.  Follow.  Xanax refilled.  Takes prn.         Other Visit Diagnoses    Routine general medical examination at a health care facility    -  Primary       Einar Pheasant, MD

## 2020-06-13 ENCOUNTER — Encounter: Payer: Self-pay | Admitting: Internal Medicine

## 2020-06-13 NOTE — Assessment & Plan Note (Signed)
Was previously worked up by Dr Jacqlyn Larsen.  Myoglobinuria.  Plan for f/u urinalysis.

## 2020-06-13 NOTE — Assessment & Plan Note (Signed)
Worked up by urology.  Stable.

## 2020-06-13 NOTE — Assessment & Plan Note (Signed)
As outlined.  Trial of hydroxyzine.  Follow.

## 2020-06-13 NOTE — Assessment & Plan Note (Signed)
Increased stress as outlined.  Overall appeats to be handling things relatively well.  Not sleeping well.  Has tried melatonin and trazodone.  Discussed a trial of hydroxyzine.  Follow.  Xanax refilled.  Takes prn.

## 2020-06-13 NOTE — Assessment & Plan Note (Signed)
Physical today 06/07/20.  Colonoscopy 01/20/18.  Recommended f/u in 5 years.  Mammogram 09/14/19 - birads I.

## 2020-06-13 NOTE — Assessment & Plan Note (Signed)
Follow cbc.  

## 2020-07-18 IMAGING — US US ABDOMEN COMPLETE
1 series · 14 of 25 positions shown · non-contrast
Comparison: None.

CLINICAL DATA: Right back pain, diarrhea

EXAM:
ABDOMEN ULTRASOUND COMPLETE

[Series 1: us abdomen complete · 0.20mm/px · 14 of 79 slices shown]
[im 1/79]
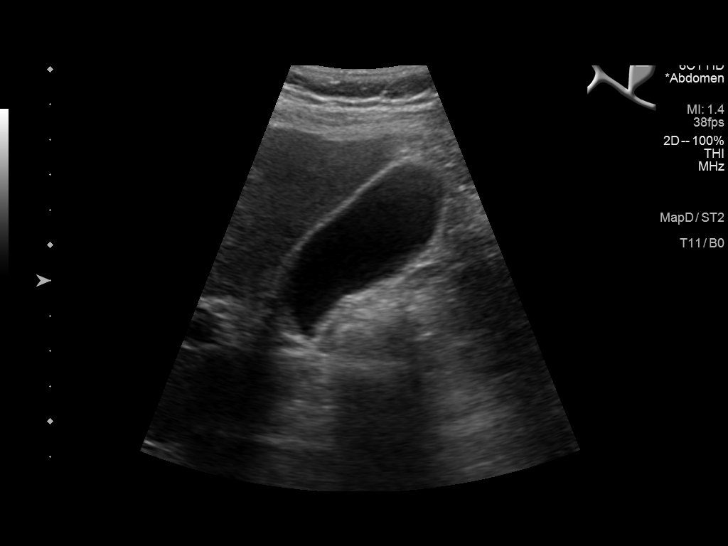
[im 7/79]
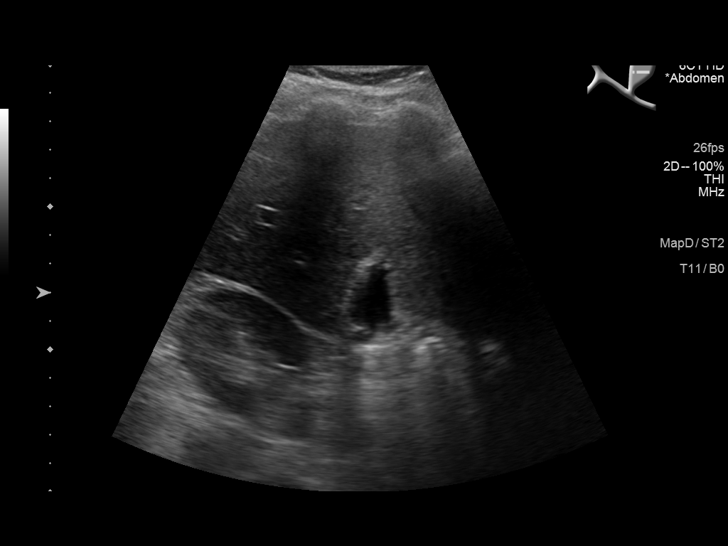
[im 14/79]
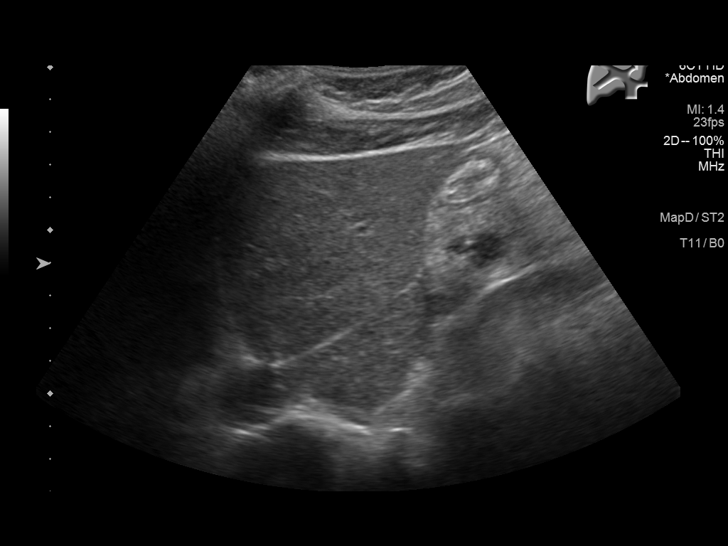
[im 20/79]
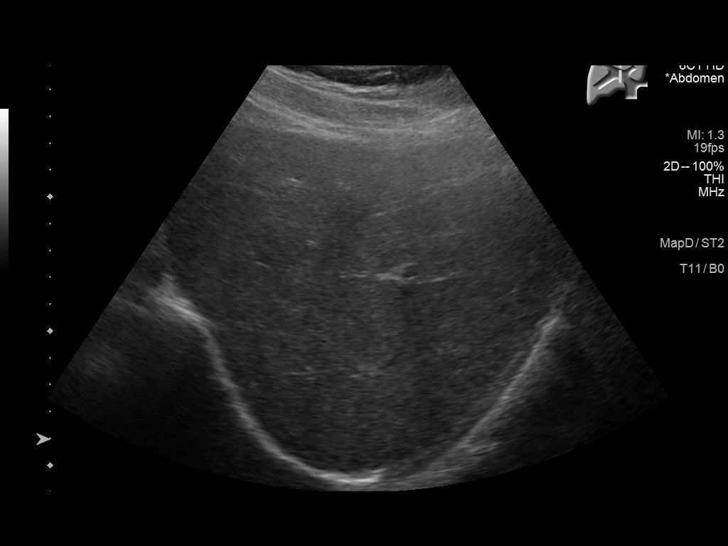
[im 27/79]
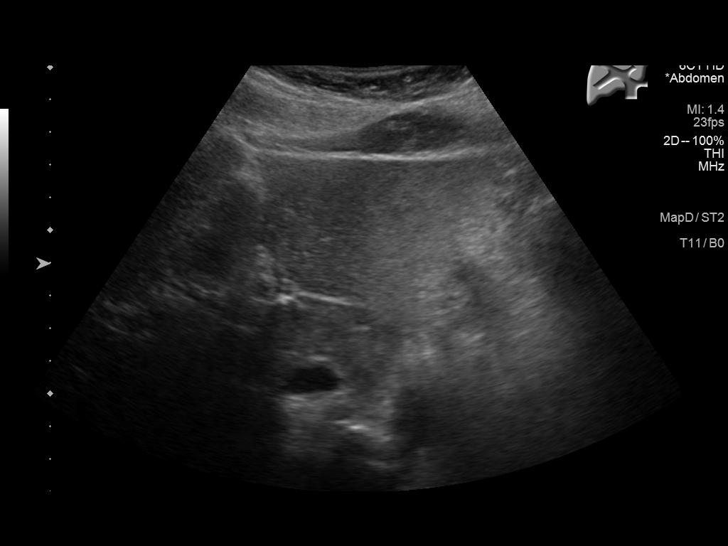
[im 30/79]
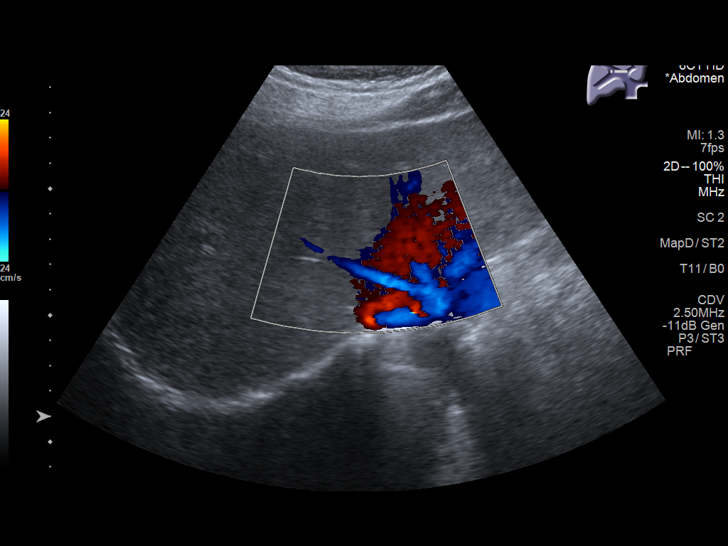
[im 36/79]
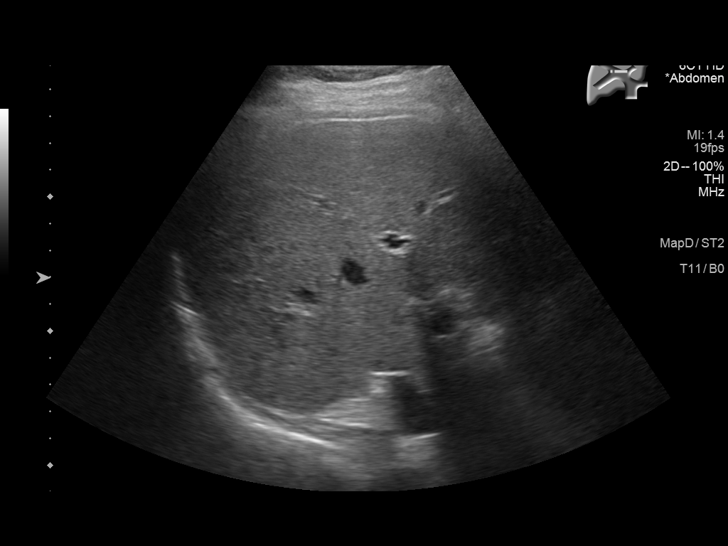
[im 43/79]
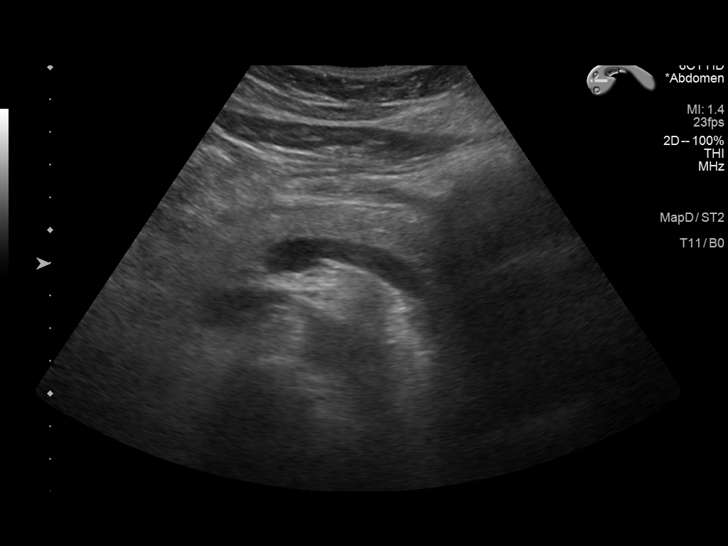
[im 49/79]
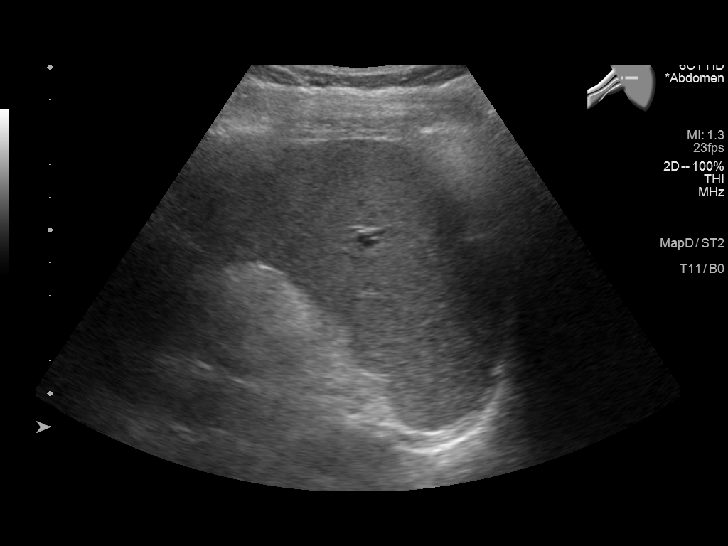
[im 53/79]
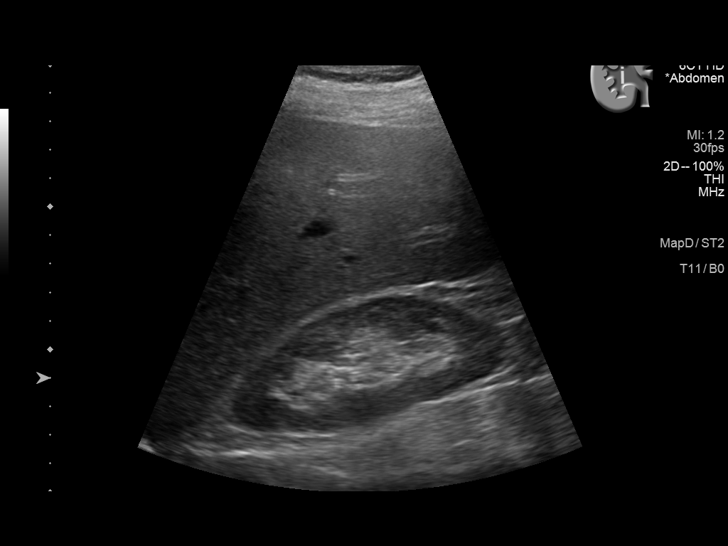
[im 59/79]
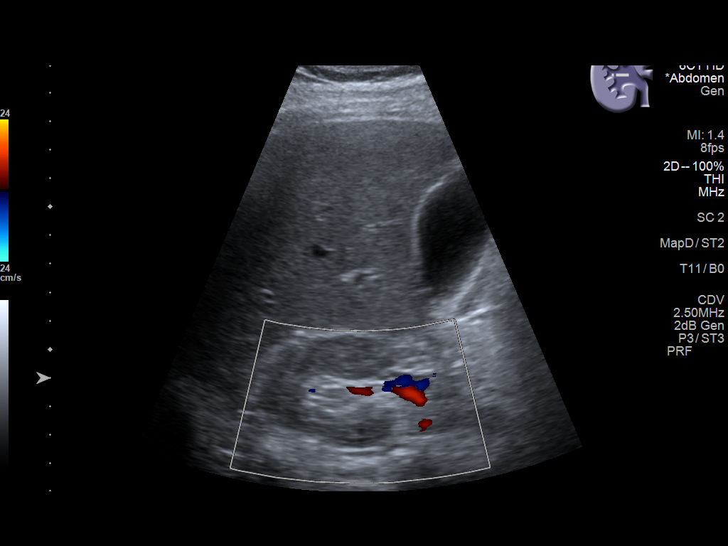
[im 66/79]
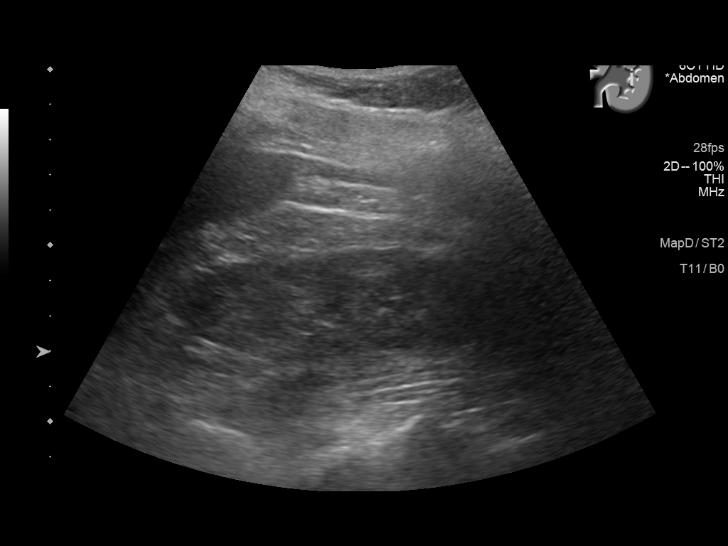
[im 72/79]
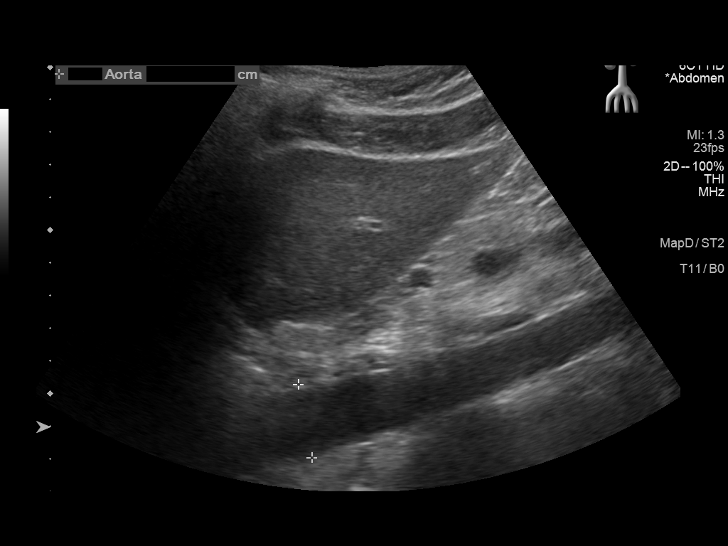
[im 79/79]
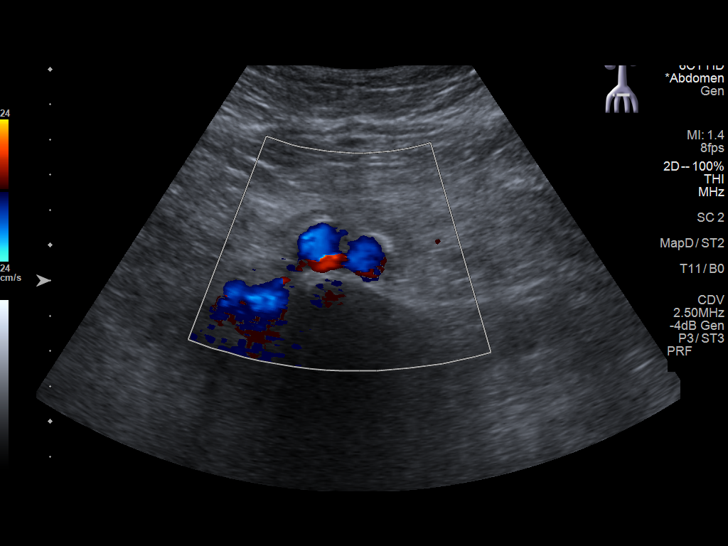

[14 of 25 positions shown; findings below may reference images not displayed]

FINDINGS: Gallbladder: No gallstones or wall thickening visualized. Sludge is
present. No sonographic Murphy sign noted by sonographer.

Common bile duct: Diameter: 2 mm

Liver: No focal lesion. Normal echogenicity. Portal vein is patent
on color Doppler imaging with normal direction of blood flow towards
the liver.

IVC: No abnormality visualized.

Pancreas: Visualized portion unremarkable.

Spleen: Size and appearance within normal limits.

Right Kidney: Length: 12.5 cm. Echogenicity within normal limits. No
mass or hydronephrosis visualized.

Left Kidney: Length: 11.3 cm. Echogenicity within normal limits. No
mass or hydronephrosis visualized.

Abdominal aorta: No aneurysm visualized.

Other findings: None.
IMPRESSION: Small volume gallbladder sludge. No sonographic evidence of acute
cholecystitis.

## 2020-09-07 ENCOUNTER — Other Ambulatory Visit (INDEPENDENT_AMBULATORY_CARE_PROVIDER_SITE_OTHER): Payer: Federal, State, Local not specified - PPO

## 2020-09-07 ENCOUNTER — Other Ambulatory Visit: Payer: Self-pay

## 2020-09-07 DIAGNOSIS — R319 Hematuria, unspecified: Secondary | ICD-10-CM

## 2020-09-08 LAB — URINALYSIS, ROUTINE W REFLEX MICROSCOPIC
Bilirubin Urine: NEGATIVE
Ketones, ur: NEGATIVE
Nitrite: NEGATIVE
Specific Gravity, Urine: <=1.005 — AB (ref 1.000–1.030)
Total Protein, Urine: NEGATIVE
Urine Glucose: NEGATIVE
Urobilinogen, UA: 0.2 (ref 0.0–1.0)
pH: 6.5 (ref 5.0–8.0)

## 2020-09-09 ENCOUNTER — Encounter: Payer: Self-pay | Admitting: Internal Medicine

## 2020-09-15 DIAGNOSIS — Z1231 Encounter for screening mammogram for malignant neoplasm of breast: Secondary | ICD-10-CM | POA: Diagnosis not present

## 2020-09-15 LAB — HM MAMMOGRAPHY

## 2020-10-26 DIAGNOSIS — L309 Dermatitis, unspecified: Secondary | ICD-10-CM | POA: Diagnosis not present

## 2020-10-26 DIAGNOSIS — D2272 Melanocytic nevi of left lower limb, including hip: Secondary | ICD-10-CM | POA: Diagnosis not present

## 2020-10-26 DIAGNOSIS — D2261 Melanocytic nevi of right upper limb, including shoulder: Secondary | ICD-10-CM | POA: Diagnosis not present

## 2020-10-26 DIAGNOSIS — Z85828 Personal history of other malignant neoplasm of skin: Secondary | ICD-10-CM | POA: Diagnosis not present

## 2020-11-16 DIAGNOSIS — K080 Exfoliation of teeth due to systemic causes: Secondary | ICD-10-CM | POA: Diagnosis not present

## 2020-12-05 ENCOUNTER — Telehealth: Payer: Self-pay

## 2020-12-05 NOTE — Telephone Encounter (Signed)
Thursday is fine.

## 2020-12-05 NOTE — Telephone Encounter (Signed)
Pt states that she had vaginal bleeding through the weekend-she states the bleeding has stopped but she thinks she has vaginal prolapse. She states something does not look/feel right. No pain-just sensitivity. She has an appt with Dr Darrick Huntsman this Thursday. Please advise

## 2020-12-05 NOTE — Telephone Encounter (Signed)
FYI

## 2020-12-06 NOTE — Telephone Encounter (Signed)
It Dr Darrick Huntsman needs slot for her pts, I can work [name] in next week.

## 2020-12-06 NOTE — Telephone Encounter (Signed)
Pt is scheduled for Thursday at 9:30.

## 2020-12-08 ENCOUNTER — Encounter: Payer: Self-pay | Admitting: Internal Medicine

## 2020-12-08 ENCOUNTER — Other Ambulatory Visit (HOSPITAL_COMMUNITY)
Admission: RE | Admit: 2020-12-08 | Discharge: 2020-12-08 | Disposition: A | Discharge status: Home / Self Care | Payer: Federal, State, Local not specified - PPO | Source: Ambulatory Visit | Attending: Internal Medicine | Admitting: Internal Medicine

## 2020-12-08 ENCOUNTER — Other Ambulatory Visit: Payer: Self-pay

## 2020-12-08 ENCOUNTER — Ambulatory Visit: Payer: Federal, State, Local not specified - PPO | Admitting: Internal Medicine

## 2020-12-08 VITALS — BP 146/84 | HR 93 | Temp 98.8°F | Resp 16 | Ht 66.0 in | Wt 156.6 lb

## 2020-12-08 DIAGNOSIS — N950 Postmenopausal bleeding: Secondary | ICD-10-CM | POA: Diagnosis not present

## 2020-12-08 DIAGNOSIS — F439 Reaction to severe stress, unspecified: Secondary | ICD-10-CM | POA: Diagnosis not present

## 2020-12-08 DIAGNOSIS — Z1151 Encounter for screening for human papillomavirus (HPV): Secondary | ICD-10-CM | POA: Insufficient documentation

## 2020-12-08 DIAGNOSIS — N8110 Cystocele, unspecified: Secondary | ICD-10-CM

## 2020-12-08 DIAGNOSIS — Z01419 Encounter for gynecological examination (general) (routine) without abnormal findings: Secondary | ICD-10-CM | POA: Insufficient documentation

## 2020-12-08 DIAGNOSIS — R310 Gross hematuria: Secondary | ICD-10-CM

## 2020-12-08 DIAGNOSIS — Z124 Encounter for screening for malignant neoplasm of cervix: Secondary | ICD-10-CM | POA: Insufficient documentation

## 2020-12-08 LAB — URINALYSIS, ROUTINE W REFLEX MICROSCOPIC
Bilirubin Urine: NEGATIVE
Ketones, ur: NEGATIVE
Leukocytes,Ua: NEGATIVE
Nitrite: NEGATIVE
Specific Gravity, Urine: 1.02 (ref 1.000–1.030)
Total Protein, Urine: NEGATIVE
Urine Glucose: NEGATIVE
Urobilinogen, UA: 0.2 (ref 0.0–1.0)
pH: 6.5 (ref 5.0–8.0)

## 2020-12-08 MED ORDER — ALPRAZOLAM 0.25 MG PO TABS: ORAL_TABLET | ORAL | 0 refills | Status: DC

## 2020-12-08 NOTE — Patient Instructions (Signed)
Resume use of estrogen  If bleeding occurs. Insert tampon into vagina to confirm source of bleeding  Referral to Dr Lafe Garin Urogynecology at [facility] like you have an early cystocele About Cystocele  Overview  The pelvic organs, including the bladder, are normally supported by pelvic floor muscles and ligaments.  When these muscles and ligaments are stretched, weakened or torn, the wall between the bladder and the vagina sags or herniates causing a prolapse, sometimes called a cystocele.  This condition may cause discomfort and problems with emptying the bladder.  It can be present in various stages.  Some people are not aware of the changes.  Others may notice changes at the vaginal opening or a feeling of the bladder dropping outside the body.  Causes of a Cystocele  A cystocele is usually caused by muscle straining or stretching during childbirth.  In addition, cystocele is more common after menopause, because the hormone estrogen helps keep the elastic tissues around the pelvic organs strong.  A cystocele is more likely to occur when levels of estrogen decrease.  Other causes include: heavy lifting, chronic coughing, previous pelvic surgery and obesity.  Symptoms  A bladder that has dropped from its normal position may cause: unwanted urine leakage (stress incontinence), frequent urination or urge to urinate, incomplete emptying of the bladder (not feeling bladder relief after emptying), pain or discomfort in the vagina, pelvis, groin, lower back or lower abdomen and frequent urinary tract infections.  Mild cases may not cause any symptoms.  Treatment Options  Pelvic floor (Kegel) exercises:  Strength training the muscles in your genital area  Behavioral changes: Treating and preventing constipation, taking time to empty your bladder properly, learning to lift properly and/or avoid heavy lifting when possible, stopping smoking, avoiding weight gain and treating a chronic  cough or bronchitis.  A pessary: A vaginal support device is sometimes used to help pelvic support caused by muscle and ligament changes.  Surgery: Surgical repair may be necessary if symptoms cannot be managed with exercise, behavioral changes and a pessary.  Surgery is usually considered for severe cases.   2007, [employer]

## 2020-12-08 NOTE — Progress Notes (Signed)
Subjective:  Patient ID: Kelly Tanner, female    DOB: [birth year]  Age: 59 y.o. MRN: [id number]  CC: The primary encounter diagnosis was Cervical cancer screening. Diagnoses of Postmenopausal bleeding, Gross hematuria, Stress, Cystocele, unspecified (CODE), and Postmenopausal vaginal bleeding were also pertinent to this visit.  HPI Kelly Tanner presents for evaluation of vaginal problems.   This visit occurred during the SARS-CoV-2 public health emergency.  Safety protocols were in place, including screening questions prior to the visit, additional usage of staff PPE, and extensive cleaning of exam room while observing appropriate contact time as indicated for disinfecting solutions.    59 yr old female with history of interstitial cystitis (asymptomatic for years  Without treatment) previously diagnosed and  managed by Urology presents with recent onset of scant vaginal bleeding. described as "spotting " that occurred last week;  Has had none since last Friday.  Only occurred with voiding and, a trace amount noted on underwear.  She did a self exam and noted urethra was red and swollen, and that her  urethral meatal opening appeared more dilated  than usual.    Outpatient Medications Prior to Visit  Medication Sig Dispense Refill  . estradiol (ESTRACE) 0.1 MG/GM vaginal cream As directed. 42.5 g 1  . hydrOXYzine (ATARAX/VISTARIL) 10 MG tablet Take 1 tablet (10 mg total) by mouth at bedtime as needed. 30 tablet 0  . triamcinolone (KENALOG) 0.1 % Apply topically.    . ALPRAZolam (XANAX) 0.25 MG tablet Take one tablet q day prn 30 tablet 0   No facility-administered medications prior to visit.    Review of Systems;  Patient denies headache, fevers, malaise, unintentional weight loss, skin rash, eye pain, sinus congestion and sinus pain, sore throat, dysphagia,  hemoptysis , cough, dyspnea, wheezing, chest pain, palpitations, orthopnea, edema, abdominal pain, nausea, melena, diarrhea,  constipation, flank pain, dysuria, hematuria, urinary  Frequency, nocturia, numbness, tingling, seizures,  Focal weakness, Loss of consciousness,  Tremor, insomnia, depression, anxiety, and suicidal ideation.      Objective:  BP (!) 146/84 (BP Location: Left Arm, Patient Position: Sitting, Cuff Size: Normal)   Pulse 93   Temp 98.8 F (37.1 C) (Oral)   Resp 16   Ht 5\' 6"  (1.676 m)   Wt 156 lb 9.6 oz (71 kg)   LMP 11/02/2010   SpO2 99%   BMI 25.28 kg/m   BP Readings from Last 3 Encounters:  12/08/20 (!) 146/84  06/07/20 130/78  03/01/20 140/80    Wt Readings from Last 3 Encounters:  12/08/20 156 lb 9.6 oz (71 kg)  06/07/20 155 lb 6.4 oz (70.5 kg)  03/01/20 158 lb 3.2 oz (71.8 kg)    General appearance: alert, cooperative and appears stated age Ears: normal TM's and external ear canals both ears Throat: lips, mucosa, and tongue normal; teeth and gums normal Neck: no adenopathy, no carotid bruit, supple, symmetrical, trachea midline and thyroid not enlarged, symmetric, no tenderness/mass/nodules Back: symmetric, no curvature. ROM normal. No CVA tenderness. Lungs: clear to auscultation bilaterally Heart: regular rate and rhythm, S1, S2 normal, no murmur, click, rub or gallop Abdomen: soft, non-tender; bowel sounds normal; no masses,  no organomegaly GYN: urethral meatus is elongated and dilated.  Patient has early stage cystocele brought on with cough.  Vaginal vault normal,  No signs of recent bleeding.  Pulses: 2+ and symmetric Skin: Skin color, texture, turgor normal. No rashes or lesions Lymph nodes: Cervical, supraclavicular, and axillary nodes normal.  No results found  for: HGBA1C  Lab Results  Component Value Date   CREATININE 0.77 06/02/2020   CREATININE 0.84 12/01/2019   CREATININE 0.83 06/02/2019    Lab Results  Component Value Date   WBC 7.1 06/02/2020   HGB 12.3 06/02/2020   HCT 36.8 06/02/2020   PLT 216.0 06/02/2020   GLUCOSE 98 06/02/2020   CHOL 182  06/02/2020   TRIG 170.0 (H) 06/02/2020   HDL 36.20 (L) 06/02/2020   LDLDIRECT 101.0 05/19/2018   LDLCALC 112 (H) 06/02/2020   ALT 22 06/02/2020   AST 24 06/02/2020   NA 137 06/02/2020   K 3.9 06/02/2020   CL 101 06/02/2020   CREATININE 0.77 06/02/2020   BUN 13 06/02/2020   CO2 27 06/02/2020   TSH 3.02 06/02/2020    US Abdomen Complete  Result Date: 12/01/2019 CLINICAL DATA:  Right back pain, diarrhea EXAM: ABDOMEN ULTRASOUND COMPLETE COMPARISON:  None. FINDINGS: Gallbladder: No gallstones or wall thickening visualized. Sludge is present. No sonographic Murphy sign noted by sonographer. Common bile duct: Diameter: 2 mm Liver: No focal lesion. Normal echogenicity. Portal vein is patent on color Doppler imaging with normal direction of blood flow towards the liver. IVC: No abnormality visualized. Pancreas: Visualized portion unremarkable. Spleen: Size and appearance within normal limits. Right Kidney: Length: 12.5 cm. Echogenicity within normal limits. No mass or hydronephrosis visualized. Left Kidney: Length: 11.3 cm. Echogenicity within normal limits. No mass or hydronephrosis visualized. Abdominal aorta: No aneurysm visualized. Other findings: None. IMPRESSION: Small volume gallbladder sludge. No sonographic evidence of acute cholecystitis. Electronically Signed   By: Macy Mis M.D.   On: 12/01/2019 17:00    Assessment & Plan:   Problem List Items Addressed This Visit      Unprioritized   Hematuria   Relevant Orders   Urinalysis, Routine w reflex microscopic (Completed)   Urine Culture (Completed)   Postmenopausal vaginal bleeding    Suspected but not proven .  Exam noncontributory.  UA /culture negative for infection  PAP smear done and results pending.  Referral to Bl[facility] Rich for evaluation given presence of cystocele on exam. Advised to place tampon in vagina if bleeding occurs,  To help confirm source of bleeding      Stress   Relevant Medications   ALPRAZolam  (XANAX) 0.25 MG tablet    Other Visit Diagnoses    Cervical cancer screening    -  Primary   Relevant Orders   Cytology - PAP( [facility])   Postmenopausal bleeding       Relevant Orders   Cytology - PAP( [place])   Ambulatory referral to Urogynecology   Cystocele, unspecified (CODE)       Relevant Orders   Ambulatory referral to Urogynecology     I provided  30 minutes of  face-to-face time during this encounter reviewing patient's current problems and past surgeries, labs and imaging studies, providing counseling on the above mentioned problems , and coordination  of care .  I am having Yaris B. Marley maintain her estradiol, hydrOXYzine, triamcinolone, and ALPRAZolam.  Meds ordered this encounter  Medications  . ALPRAZolam (XANAX) 0.25 MG tablet    Sig: Take one tablet q day prn    Dispense:  30 tablet    Refill:  0    Medications Discontinued During This Encounter  Medication Reason  . ALPRAZolam (XANAX) 0.25 MG tablet Reorder    Follow-up: No follow-ups on file.   Crecencio Mc, MD

## 2020-12-09 LAB — URINE CULTURE
MICRO NUMBER:: 11370339
SPECIMEN QUALITY:: ADEQUATE

## 2020-12-10 DIAGNOSIS — N950 Postmenopausal bleeding: Secondary | ICD-10-CM | POA: Insufficient documentation

## 2020-12-10 NOTE — Assessment & Plan Note (Addendum)
Suspected but not proven .  Exam noncontributory.  UA /culture negative for infection  PAP smear done and results pending.  Referral to Lafe Garin for evaluation given presence of cystocele on exam. Advised to place tampon in vagina if bleeding occurs,  To help confirm source of bleeding

## 2020-12-12 LAB — CYTOLOGY - PAP
Chlamydia: NEGATIVE
Comment: NEGATIVE
Comment: NEGATIVE
Comment: NEGATIVE
Diagnosis: NEGATIVE
High risk HPV: NEGATIVE
Trichomonas: NEGATIVE

## 2020-12-13 ENCOUNTER — Ambulatory Visit: Payer: Federal, State, Local not specified - PPO | Admitting: Internal Medicine

## 2020-12-14 ENCOUNTER — Encounter: Payer: Federal, State, Local not specified - PPO | Admitting: Obstetrics and Gynecology

## 2020-12-15 NOTE — Progress Notes (Signed)
Kelly Tanner,   I am happy to inform you that your PAP smear was normal,  And your  HPV screen was negative.  We will repeat your cervical cancer screening in 3 years .  The referral to Dr Doy Hutching is underway.    Regards,   Duncan Dull, MD

## 2020-12-16 DIAGNOSIS — J209 Acute bronchitis, unspecified: Secondary | ICD-10-CM | POA: Diagnosis not present

## 2020-12-16 DIAGNOSIS — J019 Acute sinusitis, unspecified: Secondary | ICD-10-CM | POA: Diagnosis not present

## 2020-12-16 DIAGNOSIS — Z03818 Encounter for observation for suspected exposure to other biological agents ruled out: Secondary | ICD-10-CM | POA: Diagnosis not present

## 2020-12-16 DIAGNOSIS — R6889 Other general symptoms and signs: Secondary | ICD-10-CM | POA: Diagnosis not present

## 2020-12-19 ENCOUNTER — Telehealth: Payer: Self-pay | Admitting: Internal Medicine

## 2020-12-19 ENCOUNTER — Encounter: Payer: Self-pay | Admitting: Internal Medicine

## 2020-12-19 NOTE — Telephone Encounter (Signed)
Per your conversation with pt, she is going to keep office visit - virtual.  Agree with keeping given her current issues.

## 2020-12-19 NOTE — Telephone Encounter (Signed)
Pt is going to keep appt for now and see how she feels tomorrow.

## 2020-12-19 NOTE — Telephone Encounter (Signed)
Correct. Patient is going to update me tomorrow.

## 2020-12-19 NOTE — Telephone Encounter (Signed)
Pt wanted to know if she needed to keep her appt for 12/21/20. It would have to be a VV because she tested for covid and it was neg. She is having a cough  But only wants to keep the appt if Dr. Nicki Reaper thinks she should

## 2020-12-19 NOTE — Telephone Encounter (Signed)
Patient would like to keep her Wednesday virtual.

## 2020-12-21 ENCOUNTER — Telehealth: Payer: Self-pay | Admitting: Internal Medicine

## 2020-12-21 ENCOUNTER — Encounter: Payer: Self-pay | Admitting: Internal Medicine

## 2020-12-21 ENCOUNTER — Telehealth (INDEPENDENT_AMBULATORY_CARE_PROVIDER_SITE_OTHER): Payer: Federal, State, Local not specified - PPO | Admitting: Internal Medicine

## 2020-12-21 DIAGNOSIS — E7800 Pure hypercholesterolemia, unspecified: Secondary | ICD-10-CM

## 2020-12-21 DIAGNOSIS — F439 Reaction to severe stress, unspecified: Secondary | ICD-10-CM | POA: Diagnosis not present

## 2020-12-21 DIAGNOSIS — N950 Postmenopausal bleeding: Secondary | ICD-10-CM | POA: Diagnosis not present

## 2020-12-21 DIAGNOSIS — D649 Anemia, unspecified: Secondary | ICD-10-CM

## 2020-12-21 DIAGNOSIS — R0981 Nasal congestion: Secondary | ICD-10-CM | POA: Diagnosis not present

## 2020-12-21 NOTE — Telephone Encounter (Signed)
Pt wanted to have lab done before her CPE in July  No orders in  lab appt has been scheduled

## 2020-12-21 NOTE — Progress Notes (Signed)
Patient ID: Kelly Tanner, female   DOB: 11-Mar-1961, 60 y.o.   MRN: [id number]   Virtual Visit via telephone Note  This visit type was conducted due to national recommendations for restrictions regarding the COVID-19 pandemic (e.g. social distancing).  This format is felt to be most appropriate for this patient at this time.  All issues noted in this document were discussed and addressed.  No physical exam was performed (except for noted visual exam findings with Video Visits).   I connected with Kelly Tanner by telephone and verified that I am speaking with the correct person using two identifiers. Location patient: home Location provider: work Persons participating in the virtual visit: patient, provider  The limitations, risks, security and privacy concerns of performing an evaluation and management service by telephone and the availability of in person appointments have been discussed. It also been discussed with the patient that there may be a patient responsible charge related to this service. The patient expressed understanding and agreed to proceed.   Reason for visit:  Follow up appt  HPI: Was recently seen at Urgent Care for cough, congestion and spasms.  Diagnosed with bronchitis and sinusitis.   Prescribed zpak, prednisone and tessalon perles.  Took last prednisone yesterday.  Completed zpak yesterday.  Still some nasal drainage.  No chest pain or sob.  No acid reflux reported.  No abdominal pain reported.  12/01/20 - noticed spotting in her underwear.  12/24 - noticed blood after wiping.  Described urethra being inflamed and felt "opening bigger".  She has not been using estrogen cream regularly.  Plans to start using more.  No chest pain or sob reported.  No nausea or vomiting reported.    ROS: See pertinent positives and negatives per HPI.  Past Medical History:  Diagnosis Date  . Basal cell carcinoma   . Hyperlipidemia     Past Surgical History:  Procedure Laterality  Date  . CYST REMOVAL NECK    . REFRACTIVE SURGERY    . SKIN CANCER EXCISION  2001   basal cell    Family History  Problem Relation Age of Onset  . Lung cancer Other        parent  . Colon cancer Other        parent    SOCIAL HX: reviewed.    Current Outpatient Medications:  .  ALPRAZolam (XANAX) 0.25 MG tablet, Take one tablet q day prn, Disp: 30 tablet, Rfl: 0 .  estradiol (ESTRACE) 0.1 MG/GM vaginal cream, As directed., Disp: 42.5 g, Rfl: 1 .  hydrOXYzine (ATARAX/VISTARIL) 10 MG tablet, Take 1 tablet (10 mg total) by mouth at bedtime as needed., Disp: 30 tablet, Rfl: 0 .  triamcinolone (KENALOG) 0.1 %, Apply topically., Disp: , Rfl:   EXAM:  GENERAL: alert. Sounds to be in no acute distress.  Answering questions appropriately.    PSYCH/NEURO: pleasant and cooperative, no obvious depression or anxiety, speech and thought processing grossly intact  ASSESSMENT AND PLAN:  Discussed the following assessment and plan:  Problem List Items Addressed This Visit    Hypercholesterolemia    Low cholesterol diet and exercise.  Follow lipid panel.        Nasal congestion    Recently evaluated and treated with prednisone, zpak and tessalon perles.  Better.  With some increased nasal congestion and drainage.  nasacort nasal spray and saline nasal spray as directed.  Robitussin DM.  Follow.  Confirm complete clearance.        Postmenopausal  vaginal bleeding    Previous notice of blood as outlined.  Urine ok.  PAP smear ok.  No further bleeding. Planning to see Dr Blima Rich for further evaluation and treatment.        Stress    Overall appears to be handling stress relatively well.  Follow.           I discussed the assessment and treatment plan with the patient. The patient was provided an opportunity to ask questions and all were answered. The patient agreed with the plan and demonstrated an understanding of the instructions.   The patient was advised to call back or  seek an in-person evaluation if the symptoms worsen or if the condition fails to improve as anticipated.  I provided 25 minutes of non-face-to-face time during this encounter.   Einar Pheasant, MD

## 2020-12-21 NOTE — Telephone Encounter (Signed)
My chart message sent to pt for update.   

## 2020-12-23 NOTE — Telephone Encounter (Signed)
Labs ordered future for appt.

## 2020-12-23 NOTE — Addendum Note (Signed)
Addended by: Lars Masson on: 12/23/2020 07:13 AM   Modules accepted: Orders

## 2020-12-25 ENCOUNTER — Encounter: Payer: Self-pay | Admitting: Internal Medicine

## 2020-12-25 DIAGNOSIS — R0981 Nasal congestion: Secondary | ICD-10-CM | POA: Insufficient documentation

## 2020-12-25 NOTE — Assessment & Plan Note (Signed)
Overall appears to be handling stress relatively well.  Follow.  

## 2020-12-25 NOTE — Assessment & Plan Note (Signed)
Low cholesterol diet and exercise.  Follow lipid panel.   

## 2020-12-25 NOTE — Assessment & Plan Note (Signed)
Recently evaluated and treated with prednisone, zpak and tessalon perles.  Better.  With some increased nasal congestion and drainage.  nasacort nasal spray and saline nasal spray as directed.  Robitussin DM.  Follow.  Confirm complete clearance.

## 2020-12-25 NOTE — Assessment & Plan Note (Signed)
Previous notice of blood as outlined.  Urine ok.  PAP smear ok.  No further bleeding. Planning to see Dr Blima Rich for further evaluation and treatment.

## 2021-01-24 DIAGNOSIS — N950 Postmenopausal bleeding: Secondary | ICD-10-CM | POA: Diagnosis not present

## 2021-01-24 DIAGNOSIS — R3129 Other microscopic hematuria: Secondary | ICD-10-CM | POA: Diagnosis not present

## 2021-01-24 DIAGNOSIS — N368 Other specified disorders of urethra: Secondary | ICD-10-CM | POA: Diagnosis not present

## 2021-02-16 ENCOUNTER — Telehealth: Payer: Federal, State, Local not specified - PPO | Admitting: Physician Assistant

## 2021-02-16 DIAGNOSIS — J069 Acute upper respiratory infection, unspecified: Secondary | ICD-10-CM

## 2021-02-16 MED ORDER — BENZONATATE 100 MG PO CAPS: 100.0000 mg | ORAL_CAPSULE | Freq: Three times a day (TID) | ORAL | 0 refills | Status: DC | PRN

## 2021-02-16 NOTE — Progress Notes (Signed)
I have spent 5 minutes in review of e-visit questionnaire, review and updating patient chart, medical decision making and response to patient.   Malory Spurr Cody Ikechukwu Cerny, PA-C    

## 2021-02-16 NOTE — Progress Notes (Signed)
We are sorry that you are not feeling well.  Here is how we plan to help!  Based on your presentation I believe you most likely have A cough due to a virus.  This is called viral bronchitis and is best treated by rest, plenty of fluids and control of the cough.  You may use Ibuprofen or Tylenol as directed to help your symptoms.     In addition you may use A prescription cough medication called Tessalon Perles 100mg . You may take 1-2 capsules every 8 hours as needed for your cough. A prescription has been sent to the pharmacy for you.   From your responses in the eVisit questionnaire you describe inflammation in the upper respiratory tract which is causing a significant cough.  This is commonly called Bronchitis and has four common causes:    Allergies  Viral Infections  Acid Reflux  Bacterial Infection Allergies, viruses and acid reflux are treated by controlling symptoms or eliminating the cause. An example might be a cough caused by taking certain blood pressure medications. You stop the cough by changing the medication. Another example might be a cough caused by acid reflux. Controlling the reflux helps control the cough.  USE OF BRONCHODILATOR ("RESCUE") INHALERS: There is a risk from using your bronchodilator too frequently.  The risk is that over-reliance on a medication which only relaxes the muscles surrounding the breathing tubes can reduce the effectiveness of medications prescribed to reduce swelling and congestion of the tubes themselves.  Although you feel brief relief from the bronchodilator inhaler, your asthma may actually be worsening with the tubes becoming more swollen and filled with mucus.  This can delay other crucial treatments, such as oral steroid medications. If you need to use a bronchodilator inhaler daily, several times per day, you should discuss this with your provider.  There are probably better treatments that could be used to keep your asthma under control.      HOME CARE . Only take medications as instructed by your medical team. . Complete the entire course of an antibiotic. . Drink plenty of fluids and get plenty of rest. . Avoid close contacts especially the very young and the elderly . Cover your mouth if you cough or cough into your sleeve. . Always remember to wash your hands . A steam or ultrasonic humidifier can help congestion.   GET HELP RIGHT AWAY IF: . You develop worsening fever. . You become short of breath . You cough up blood. . Your symptoms persist after you have completed your treatment plan MAKE SURE YOU   Understand these instructions.  Will watch your condition.  Will get help right away if you are not doing well or get worse.  Your e-visit answers were reviewed by a board certified advanced clinical practitioner to complete your personal care plan.  Depending on the condition, your plan could have included both over the counter or prescription medications. If there is a problem please reply  once you have received a response from your provider. Your safety is important to [place].  If you have drug allergies check your prescription carefully.    You can use MyChart to ask questions about today's visit, request a non-urgent call back, or ask for a work or school excuse for 24 hours related to this e-Visit. If it has been greater than 24 hours you will need to follow up with your provider, or enter a new e-Visit to address those concerns. You will get an e-mail in  the next two days asking about your experience.  I hope that your e-visit has been valuable and will speed your recovery. Thank you for using e-visits.

## 2021-02-21 ENCOUNTER — Encounter: Payer: Self-pay | Admitting: Internal Medicine

## 2021-02-21 NOTE — Telephone Encounter (Signed)
Do you want to do 12 tomorrow or 4 on Thursday?

## 2021-02-21 NOTE — Telephone Encounter (Signed)
Ok to do 12:00 Wednesday.

## 2021-02-22 ENCOUNTER — Telehealth: Payer: Self-pay | Admitting: Internal Medicine

## 2021-02-22 ENCOUNTER — Encounter: Payer: Self-pay | Admitting: Internal Medicine

## 2021-02-22 ENCOUNTER — Telehealth (INDEPENDENT_AMBULATORY_CARE_PROVIDER_SITE_OTHER): Payer: Federal, State, Local not specified - PPO | Admitting: Internal Medicine

## 2021-02-22 DIAGNOSIS — F439 Reaction to severe stress, unspecified: Secondary | ICD-10-CM | POA: Diagnosis not present

## 2021-02-22 DIAGNOSIS — R059 Cough, unspecified: Secondary | ICD-10-CM | POA: Diagnosis not present

## 2021-02-22 MED ORDER — PREDNISONE 10 MG PO TABS: ORAL_TABLET | ORAL | 0 refills | Status: DC

## 2021-02-22 MED ORDER — AZITHROMYCIN 250 MG PO TABS: ORAL_TABLET | ORAL | 0 refills | Status: DC

## 2021-02-22 NOTE — Telephone Encounter (Signed)
Patient has been scheduled

## 2021-02-22 NOTE — Telephone Encounter (Signed)
My chart message sent to pt for update.   

## 2021-02-22 NOTE — Progress Notes (Signed)
Patient ID: Kelly Tanner, female   DOB: 1961-10-05, 60 y.o.   MRN: [id number]   Virtual Visit via video Note  This visit type was conducted due to national recommendations for restrictions regarding the COVID-19 pandemic (e.g. social distancing).  This format is felt to be most appropriate for this patient at this time.  All issues noted in this document were discussed and addressed.  No physical exam was performed (except for noted visual exam findings with Video Visits).   I connected with Kelly Tanner by a video enabled telemedicine application and verified that I am speaking with the correct person using two identifiers. Location patient: home Location provider: work Persons participating in the virtual visit: patient, provider  The limitations, risks, security and privacy concerns of performing an evaluation and management service by video and the availability of in person appointments have been discussed.  It has also been discussed with the patient that there may be a patient responsible charge related to this service. The patient expressed understanding and agreed to proceed.   Reason for visit: work in appt.   HPI: Work in for persistent cough.  States symptoms started approximately one week ago.  Reports dry cough.  No significant congestion.  Last Thursday 02/16/21 - E visit.  Prescribed Tessalon Perles.  Has been using honey as well.  Helped some during the day.  Increased cough at night.  After coughing - has to blow nose.  Some sneezing.  Started mucinex a couple of days ago.  Also started nasacort.  No nausea, vomiting or diarrhea.  Gets a tickle in her throat.  Temp - 99.  Does have coughing fits.  Recent covid test negative.  No known covid exposure.     ROS: See pertinent positives and negatives per HPI.  Past Medical History:  Diagnosis Date   Basal cell carcinoma    Hyperlipidemia     Past Surgical History:  Procedure Laterality Date   CYST REMOVAL NECK      REFRACTIVE SURGERY     SKIN CANCER EXCISION  2001   basal cell    Family History  Problem Relation Age of Onset   Lung cancer Other        parent   Colon cancer Other        parent    SOCIAL HX: reviewed.    Current Outpatient Medications:    azithromycin (ZITHROMAX) 250 MG tablet, Take 2 tablet x 1 day and then 1 tablet per day for four more days., Disp: 6 tablet, Rfl: 0   predniSONE (DELTASONE) 10 MG tablet, Take 4 tablets x 1 day and then decrease by 1/2 tablet per day until down to zero mg., Disp: 18 tablet, Rfl: 0   ALPRAZolam (XANAX) 0.25 MG tablet, Take one tablet q day prn, Disp: 30 tablet, Rfl: 0   estradiol (ESTRACE) 0.1 MG/GM vaginal cream, As directed., Disp: 42.5 g, Rfl: 1   hydrOXYzine (ATARAX/VISTARIL) 10 MG tablet, Take 1 tablet (10 mg total) by mouth at bedtime as needed., Disp: 30 tablet, Rfl: 0   triamcinolone (KENALOG) 0.1 %, Apply topically., Disp: , Rfl:   EXAM:  GENERAL: alert, oriented, appears well and in no acute distress  HEENT: atraumatic, conjunttiva clear, no obvious abnormalities on inspection of external nose and ears  NECK: normal movements of the head and neck  LUNGS: on inspection no signs of respiratory distress, breathing rate appears normal, no obvious gross SOB, gasping or wheezing  CV: no obvious cyanosis  PSYCH/NEURO: pleasant and cooperative, no obvious depression or anxiety, speech and thought processing grossly intact  ASSESSMENT AND PLAN:  Discussed the following assessment and plan:  Problem List Items Addressed This Visit    Cough    Persistent cough as outlined.  Discussed.  URI.  Treat with zpak as directed.  Also prednisone as directed.  Continue mucinex and nasacort.  Follow.  Rest.  Fluids.  Call with update.  Hold on covid testing.  Recent test per pt - negative.       Stress    Overall appears to be doing relatively well.  Follow.           I discussed the assessment and treatment plan with the  patient. The patient was provided an opportunity to ask questions and all were answered. The patient agreed with the plan and demonstrated an understanding of the instructions.   The patient was advised to call back or seek an in-person evaluation if the symptoms worsen or if the condition fails to improve as anticipated.   Einar Pheasant, MD

## 2021-02-26 ENCOUNTER — Encounter: Payer: Self-pay | Admitting: Internal Medicine

## 2021-02-26 DIAGNOSIS — R059 Cough, unspecified: Secondary | ICD-10-CM | POA: Insufficient documentation

## 2021-02-26 NOTE — Assessment & Plan Note (Signed)
Persistent cough as outlined.  Discussed.  URI.  Treat with zpak as directed.  Also prednisone as directed.  Continue mucinex and nasacort.  Follow.  Rest.  Fluids.  Call with update.  Hold on covid testing.  Recent test per pt - negative.

## 2021-02-26 NOTE — Assessment & Plan Note (Signed)
Overall appears to be doing relatively well.  Follow.   

## 2021-04-25 DIAGNOSIS — D2261 Melanocytic nevi of right upper limb, including shoulder: Secondary | ICD-10-CM | POA: Diagnosis not present

## 2021-04-25 DIAGNOSIS — D225 Melanocytic nevi of trunk: Secondary | ICD-10-CM | POA: Diagnosis not present

## 2021-04-25 DIAGNOSIS — Z85828 Personal history of other malignant neoplasm of skin: Secondary | ICD-10-CM | POA: Diagnosis not present

## 2021-04-25 DIAGNOSIS — Z08 Encounter for follow-up examination after completed treatment for malignant neoplasm: Secondary | ICD-10-CM | POA: Diagnosis not present

## 2021-06-08 ENCOUNTER — Other Ambulatory Visit: Payer: Self-pay

## 2021-06-08 ENCOUNTER — Other Ambulatory Visit: Payer: Federal, State, Local not specified - PPO

## 2021-06-08 ENCOUNTER — Other Ambulatory Visit (INDEPENDENT_AMBULATORY_CARE_PROVIDER_SITE_OTHER): Payer: Federal, State, Local not specified - PPO

## 2021-06-08 DIAGNOSIS — D649 Anemia, unspecified: Secondary | ICD-10-CM | POA: Diagnosis not present

## 2021-06-08 DIAGNOSIS — E7800 Pure hypercholesterolemia, unspecified: Secondary | ICD-10-CM | POA: Diagnosis not present

## 2021-06-08 LAB — HEPATIC FUNCTION PANEL
ALT: 19 U/L (ref 0–35)
AST: 18 U/L (ref 0–37)
Albumin: 4.7 g/dL (ref 3.5–5.2)
Alkaline Phosphatase: 108 U/L (ref 39–117)
Bilirubin, Direct: 0.1 mg/dL (ref 0.0–0.3)
Total Bilirubin: 0.6 mg/dL (ref 0.2–1.2)
Total Protein: 8 g/dL (ref 6.0–8.3)

## 2021-06-08 LAB — BASIC METABOLIC PANEL
BUN: 13 mg/dL (ref 6–23)
CO2: 26 mEq/L (ref 19–32)
Calcium: 9.7 mg/dL (ref 8.4–10.5)
Chloride: 101 mEq/L (ref 96–112)
Creatinine, Ser: 0.83 mg/dL (ref 0.40–1.20)
GFR: 76.65 mL/min (ref >60.00)
Glucose, Bld: 94 mg/dL (ref 70–99)
Potassium: 4 mEq/L (ref 3.5–5.1)
Sodium: 137 mEq/L (ref 135–145)

## 2021-06-08 LAB — LIPID PANEL
Cholesterol: 187 mg/dL (ref 0–200)
HDL: 40.7 mg/dL (ref >39.00)
LDL Cholesterol: 110 mg/dL — ABNORMAL HIGH (ref 0–99)
NonHDL: 145.92
Total CHOL/HDL Ratio: 5
Triglycerides: 178 mg/dL — ABNORMAL HIGH (ref 0.0–149.0)
VLDL: 35.6 mg/dL (ref 0.0–40.0)

## 2021-06-08 LAB — CBC WITH DIFFERENTIAL/PLATELET
Basophils Absolute: 0.1 10*3/uL (ref 0.0–0.1)
Basophils Relative: 0.6 % (ref 0.0–3.0)
Eosinophils Absolute: 0.1 10*3/uL (ref 0.0–0.7)
Eosinophils Relative: 1 % (ref 0.0–5.0)
HCT: 37.7 % (ref 36.0–46.0)
Hemoglobin: 12.8 g/dL (ref 12.0–15.0)
Lymphocytes Relative: 16.7 % (ref 12.0–46.0)
Lymphs Abs: 1.5 10*3/uL (ref 0.7–4.0)
MCHC: 34 g/dL (ref 30.0–36.0)
MCV: 88.1 fl (ref 78.0–100.0)
Monocytes Absolute: 0.5 10*3/uL (ref 0.1–1.0)
Monocytes Relative: 5 % (ref 3.0–12.0)
Neutro Abs: 6.9 10*3/uL (ref 1.4–7.7)
Neutrophils Relative %: 76.7 % (ref 43.0–77.0)
Platelets: 252 10*3/uL (ref 150.0–400.0)
RBC: 4.28 Mil/uL (ref 3.87–5.11)
RDW: 14.9 % (ref 11.5–15.5)
WBC: 9 10*3/uL (ref 4.0–10.5)

## 2021-06-08 LAB — TSH: TSH: 2.92 u[IU]/mL (ref 0.35–5.50)

## 2021-06-15 ENCOUNTER — Encounter: Payer: Federal, State, Local not specified - PPO | Admitting: Internal Medicine

## 2021-06-28 ENCOUNTER — Telehealth: Payer: Self-pay | Admitting: Internal Medicine

## 2021-06-28 ENCOUNTER — Other Ambulatory Visit: Payer: Self-pay

## 2021-06-28 ENCOUNTER — Encounter: Payer: Self-pay | Admitting: Internal Medicine

## 2021-06-28 ENCOUNTER — Ambulatory Visit (INDEPENDENT_AMBULATORY_CARE_PROVIDER_SITE_OTHER): Payer: Federal, State, Local not specified - PPO | Admitting: Internal Medicine

## 2021-06-28 VITALS — BP 126/72 | HR 78 | Temp 96.8°F | Resp 16 | Ht 66.0 in | Wt 155.8 lb

## 2021-06-28 DIAGNOSIS — F439 Reaction to severe stress, unspecified: Secondary | ICD-10-CM | POA: Diagnosis not present

## 2021-06-28 DIAGNOSIS — N3010 Interstitial cystitis (chronic) without hematuria: Secondary | ICD-10-CM

## 2021-06-28 DIAGNOSIS — R319 Hematuria, unspecified: Secondary | ICD-10-CM

## 2021-06-28 DIAGNOSIS — Z0000 Encounter for general adult medical examination without abnormal findings: Secondary | ICD-10-CM | POA: Diagnosis not present

## 2021-06-28 DIAGNOSIS — Z1231 Encounter for screening mammogram for malignant neoplasm of breast: Secondary | ICD-10-CM | POA: Diagnosis not present

## 2021-06-28 DIAGNOSIS — E7800 Pure hypercholesterolemia, unspecified: Secondary | ICD-10-CM

## 2021-06-28 MED ORDER — ALPRAZOLAM 0.25 MG PO TABS: ORAL_TABLET | ORAL | 0 refills | Status: DC

## 2021-06-28 MED ORDER — ESTRADIOL 0.1 MG/GM VA CREA: TOPICAL_CREAM | VAGINAL | 1 refills | Status: DC

## 2021-06-28 NOTE — Telephone Encounter (Signed)
[facility] called to get clarification on a estradiol (ESTRACE) 0.1 MG/GM vaginal cream that was called in.

## 2021-06-28 NOTE — Assessment & Plan Note (Signed)
Physical today 06/28/21.  Colonoscopy 01/20/18.  Recommended f/u in 5 years.  Mammogram 09/15/20 - [facility]).

## 2021-06-28 NOTE — Progress Notes (Signed)
Patient ID: Kelly Tanner, female   DOB: 01-11-1961, 60 y.o.   MRN: [id number]   Subjective:    Patient ID: Kelly Tanner, female    DOB: 28-Aug-1961, 60 y.o.   MRN: [id number]  HPI This visit occurred during the SARS-CoV-2 public health emergency.  Safety protocols were in place, including screening questions prior to the visit, additional usage of staff PPE, and extensive cleaning of exam room while observing appropriate contact time as indicated for disinfecting solutions.   Patient here for her physical exam. Reports she is doing well.  Staying active.  Discussed exercise.  She plans to start walking more.  No chest pain or sob reported.  No abdominal pain.  Bowels moving.  Handling stress.  No further vaginal bleeding.  Saw Dr Sharlett Iles.   Past Medical History:  Diagnosis Date   Basal cell carcinoma    Hyperlipidemia    Past Surgical History:  Procedure Laterality Date   CYST REMOVAL NECK     REFRACTIVE SURGERY     SKIN CANCER EXCISION  2001   basal cell   Family History  Problem Relation Age of Onset   Lung cancer Other        parent   Colon cancer Other        parent   Social History   Socioeconomic History   Marital status: Married    Spouse name: Not on file   Number of children: 2   Years of education: Not on file   Highest education level: Not on file  Occupational History   Not on file  Tobacco Use   Smoking status: Never   Smokeless tobacco: Never  Substance and Sexual Activity   Alcohol use: Yes    Alcohol/week: 0.0 standard drinks   Drug use: No   Sexual activity: Not on file  Other Topics Concern   Not on file  Social History Narrative   Lives at home in [place].    Married with kids   Social Determinants of Health   Financial Resource Strain: Not on file  Food Insecurity: Not on file  Transportation Needs: Not on file  Physical Activity: Not on file  Stress: Not on file  Social Connections: Not on file    Review of Systems   Constitutional:  Negative for appetite change and unexpected weight change.  HENT:  Negative for congestion, sinus pressure and sore throat.   Eyes:  Negative for pain and visual disturbance.  Respiratory:  Negative for cough, chest tightness and shortness of breath.   Cardiovascular:  Negative for chest pain, palpitations and leg swelling.  Gastrointestinal:  Negative for abdominal pain, diarrhea, nausea and vomiting.  Genitourinary:  Negative for difficulty urinating and dysuria.  Musculoskeletal:  Negative for joint swelling and myalgias.  Skin:  Negative for color change and rash.  Neurological:  Negative for dizziness, light-headedness and headaches.  Hematological:  Negative for adenopathy. Does not bruise/bleed easily.  Psychiatric/Behavioral:  Negative for agitation and dysphoric mood.       Objective:    Physical Exam Vitals reviewed.  Constitutional:      General: She is not in acute distress.    Appearance: Normal appearance. She is well-developed.  HENT:     Head: Normocephalic and atraumatic.     Right Ear: External ear normal.     Left Ear: External ear normal.  Eyes:     General: No scleral icterus.       Right eye: No discharge.  Left eye: No discharge.     Conjunctiva/sclera: Conjunctivae normal.  Neck:     Thyroid: No thyromegaly.  Cardiovascular:     Rate and Rhythm: Normal rate and regular rhythm.  Pulmonary:     Effort: No tachypnea, accessory muscle usage or respiratory distress.     Breath sounds: Normal breath sounds. No decreased breath sounds or wheezing.  Chest:  Breasts:    Right: No inverted nipple, mass, nipple discharge or tenderness (no axillary adenopathy).     Left: No inverted nipple, mass, nipple discharge or tenderness (no axilarry adenopathy).  Abdominal:     General: Bowel sounds are normal.     Palpations: Abdomen is soft.     Tenderness: There is no abdominal tenderness.  Musculoskeletal:        General: No swelling or  tenderness.     Cervical back: Neck supple.  Lymphadenopathy:     Cervical: No cervical adenopathy.  Skin:    Findings: No erythema or rash.  Neurological:     Mental Status: She is alert and oriented to person, place, and time.  Psychiatric:        Mood and Affect: Mood normal.        Behavior: Behavior normal.    BP 126/72   Pulse 78   Temp (!) 96.8 F (36 C)   Resp 16   Ht 5\' 6"  (1.676 m)   Wt 155 lb 12.8 oz (70.7 kg)   LMP 11/02/2010   SpO2 99%   BMI 25.15 kg/m  Wt Readings from Last 3 Encounters:  06/28/21 155 lb 12.8 oz (70.7 kg)  12/21/20 154 lb 5 oz (70 kg)  12/08/20 156 lb 9.6 oz (71 kg)    Outpatient Encounter Medications as of 06/28/2021  Medication Sig   ALPRAZolam (XANAX) 0.25 MG tablet Take one tablet q day prn   estradiol (ESTRACE) 0.1 MG/GM vaginal cream As directed.   hydrOXYzine (ATARAX/VISTARIL) 10 MG tablet Take 1 tablet (10 mg total) by mouth at bedtime as needed.   triamcinolone (KENALOG) 0.1 % Apply topically.   [DISCONTINUED] ALPRAZolam (XANAX) 0.25 MG tablet Take one tablet q day prn   [DISCONTINUED] azithromycin (ZITHROMAX) 250 MG tablet Take 2 tablet x 1 day and then 1 tablet per day for four more days.   [DISCONTINUED] estradiol (ESTRACE) 0.1 MG/GM vaginal cream As directed.   [DISCONTINUED] predniSONE (DELTASONE) 10 MG tablet Take 4 tablets x 1 day and then decrease by 1/2 tablet per day until down to zero mg.   No facility-administered encounter medications on file as of 06/28/2021.     Lab Results  Component Value Date   WBC 9.0 06/08/2021   HGB 12.8 06/08/2021   HCT 37.7 06/08/2021   PLT 252.0 06/08/2021   GLUCOSE 94 06/08/2021   CHOL 187 06/08/2021   TRIG 178.0 (H) 06/08/2021   HDL 40.70 06/08/2021   LDLDIRECT 101.0 05/19/2018   LDLCALC 110 (H) 06/08/2021   ALT 19 06/08/2021   AST 18 06/08/2021   NA 137 06/08/2021   K 4.0 06/08/2021   CL 101 06/08/2021   CREATININE 0.83 06/08/2021   BUN 13 06/08/2021   CO2 26 06/08/2021    TSH 2.92 06/08/2021       Assessment & Plan:   Problem List Items Addressed This Visit     Health care maintenance    Physical today 06/28/21.  Colonoscopy 01/20/18.  Recommended f/u in 5 years.  Mammogram 09/15/20 - [facility]).  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  Recheck urinalysis with next labs.         Relevant Orders   Urinalysis, Routine w reflex microscopic   Hypercholesterolemia    Low cholesterol diet and exercise.  Follow lipid panel.        Relevant Orders   Hepatic function panel   Lipid panel   Basic metabolic panel   Interstitial cystitis    Has been worked up by urology.  Stable.        Stress    Overall handling things relatively well.  Follow.         Relevant Medications   ALPRAZolam (XANAX) 0.25 MG tablet   Other Visit Diagnoses     Routine general medical examination at a health care facility    -  Primary   Encounter for screening mammogram for malignant neoplasm of breast       Relevant Orders   MM 3D SCREEN BREAST BILATERAL        Einar Pheasant, MD

## 2021-06-29 ENCOUNTER — Other Ambulatory Visit: Payer: Self-pay

## 2021-06-29 NOTE — Telephone Encounter (Signed)
New directions for 1 applicator 2 days a week given per Dr Nicki Reaper

## 2021-07-08 ENCOUNTER — Encounter: Payer: Self-pay | Admitting: Internal Medicine

## 2021-07-08 NOTE — Assessment & Plan Note (Signed)
Has been worked up by urology.  Stable.  

## 2021-07-08 NOTE — Assessment & Plan Note (Signed)
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  Recheck urinalysis with next labs.

## 2021-07-08 NOTE — Assessment & Plan Note (Signed)
Overall handling things relatively well.  Follow.  

## 2021-07-08 NOTE — Assessment & Plan Note (Signed)
Low cholesterol diet and exercise.  Follow lipid panel.   

## 2021-07-09 ENCOUNTER — Telehealth: Payer: Federal, State, Local not specified - PPO | Admitting: Nurse Practitioner

## 2021-07-09 ENCOUNTER — Encounter: Payer: Self-pay | Admitting: Nurse Practitioner

## 2021-07-09 DIAGNOSIS — U071 COVID-19: Secondary | ICD-10-CM | POA: Diagnosis not present

## 2021-07-09 MED ORDER — NIRMATRELVIR/RITONAVIR (PAXLOVID)TABLET: 3.0000 | ORAL_TABLET | Freq: Two times a day (BID) | ORAL | 0 refills | Status: AC

## 2021-07-09 NOTE — Patient Instructions (Addendum)
You are being prescribed PAXLOVID for COVID-19 infection.    Please call the pharmacy or go through the drive through vs going inside if you are picking up the mediation yourself to prevent further spread. If prescribed to a [facility] affiliated pharmacy, a pharmacist will bring the medication out to your car.   Medications to hold while taking this treatment: Xanax, Estradiol *If asked to hold, you can resume them 24 hours after your last dose   ADMINISTRATION INSTRUCTIONS: Take with or without food. Swallow the tablets whole. Don't chew, crush, or break the medications because it might not work as well  For each dose of the medication, you should be taking 3 tablets together TWICE a day for FIVE days   Finish your full five-day course of Paxlovid even if you feel better before you're done. Stopping this medication too early can make it less effective to prevent severe illness related to [place].    Paxlovid is prescribed for YOU ONLY. Don't share it with others, even if they have similar symptoms as you. This medication might not be right for everyone.  Make sure to take steps to protect yourself and others while you're taking this medication in order to get well soon and to prevent others from getting sick with COVID-19.  Paxlovid (nirmatrelvir / ritonavir) can cause hormonal birth control medications to not work well. If you or your partner is currently taking hormonal birth control, use condoms or other birth control methods to prevent unintended pregnancies.    COMMON SIDE EFFECTS: Altered or bad taste in your mouth  Diarrhea  High blood pressure (1% of people) Muscle aches (1% of people)     If your COVID-19 symptoms get worse, get medical help right away. Call 911 if you experience symptoms such as worsening cough, trouble breathing, chest pain that doesn't go away, confusion, a hard time staying awake, and pale or blue-colored skin. This medication won't prevent all  COVID-19 cases from getting worse.

## 2021-07-09 NOTE — Progress Notes (Signed)
Virtual Visit Consent   Kelly Tanner, you are scheduled for a virtual visit with a [place] provider today.     Just as with appointments in the office, your consent must be obtained to participate.  Your consent will be active for this visit and any virtual visit you may have with one of our providers in the next 365 days.     If you have a MyChart account, a copy of this consent can be sent to you electronically.  All virtual visits are billed to your insurance company just like a traditional visit in the office.    As this is a virtual visit, video technology does not allow for your provider to perform a traditional examination.  This may limit your provider's ability to fully assess your condition.  If your provider identifies any concerns that need to be evaluated in person or the need to arrange testing (such as labs, EKG, etc.), we will make arrangements to do so.     Although advances in technology are sophisticated, we cannot ensure that it will always work on either your end or our end.  If the connection with a video visit is poor, the visit may have to be switched to a telephone visit.  With either a video or telephone visit, we are not always able to ensure that we have a secure connection.     I need to obtain your verbal consent now.   Are you willing to proceed with your visit today?    Kelly Tanner has provided verbal consent on 07/09/2021 for a virtual visit (video or telephone).   Apolonio Schneiders, FNP   Date: 07/09/2021 4:54 PM   Virtual Visit via Video Note   I, Apolonio Schneiders, connected with  Kelly Tanner  ([id number], 23-Feb-1961) on 07/09/21 at  5:00 PM EDT by a video-enabled telemedicine application and verified that I am speaking with the correct person using two identifiers.  Location: Patient: Virtual Visit Location Patient: Home Provider: Virtual Visit Location Provider: Office/Clinic   I discussed the limitations of evaluation and management by  telemedicine and the availability of in person appointments. The patient expressed understanding and agreed to proceed.    History of Present Illness: Kelly Tanner is a 60 y.o. who identifies as a female who was assigned female at birth, and is being seen today after testing positive for COVID-19 at home.   She was in [place] last week for a baseball game. Patient started to have symptoms last night, took a COVID test in the middle of the night that was negative. Today she has more nasal congestion and a cough. She has a 101 fever. She took another test this afternoon and is now positive for COVID-19.  She has not had COVID-19 in the past She has been vaccinated for COVID-19 X3.   Denies a history asthma, she does get bronchitis often with URIs.  She has needed prednisone in the past to help with her coughs.   Problems:  Patient Active Problem List   Diagnosis Date Noted   Cough 02/26/2021   Nasal congestion 12/25/2020   Postmenopausal vaginal bleeding 12/10/2020   RUQ pain 12/20/2019   Leukocytosis 12/20/2019   Right shoulder pain 12/20/2019   Sleep difficulties 09/07/2019   Sore throat 09/09/2017   GERD (gastroesophageal reflux disease) 05/16/2017   Diarrhea 09/18/2015   Health care maintenance 04/17/2015   Elevated blood pressure reading 08/23/2014   Stress 04/06/2014   Anemia 10/11/2013  Hematuria 04/23/2013   Hypercholesterolemia 01/04/2013   Skin cancer 01/04/2013   Family history of colon cancer 01/04/2013   Interstitial cystitis 01/04/2013    Allergies:  Allergies  Allergen Reactions   Sulfa Antibiotics    Medications:  Current Outpatient Medications:    ALPRAZolam (XANAX) 0.25 MG tablet, Take one tablet q day prn, Disp: 30 tablet, Rfl: 0   estradiol (ESTRACE) 0.1 MG/GM vaginal cream, As directed., Disp: 42.5 g, Rfl: 1   hydrOXYzine (ATARAX/VISTARIL) 10 MG tablet, Take 1 tablet (10 mg total) by mouth at bedtime as needed., Disp: 30 tablet, Rfl: 0    triamcinolone (KENALOG) 0.1 %, Apply topically., Disp: , Rfl:   Observations/Objective: Patient is well-developed, well-nourished in no acute distress.  Resting comfortably at home.  Head is normocephalic, atraumatic.  No labored breathing.  Speech is clear and coherent with logical content.  Patient is alert and oriented at baseline.    Assessment and Plan: 1. COVID-19  - nirmatrelvir/ritonavir EUA (PAXLOVID) TABS; Take 3 tablets by mouth 2 (two) times daily for 5 days. (Take nirmatrelvir 150 mg two tablets twice daily for 5 days and ritonavir 100 mg one tablet twice daily for 5 days) Patient GFR is 76.65  Dispense: 30 tablet; Refill: 0     Continue to manage symptoms with OTC medications as discussed.  Rest, Hydrate and assure protein intake to meet caloric needs.  Follow up with PCP tomorrow will CC on note Follow Up Instructions: I discussed the assessment and treatment plan with the patient. The patient was provided an opportunity to ask questions and all were answered. The patient agreed with the plan and demonstrated an understanding of the instructions.  A copy of instructions were sent to the patient via MyChart.  The patient was advised to call back or seek an in-person evaluation if the symptoms worsen or if the condition fails to improve as anticipated.  Time:  I spent 20 minutes with the patient via telehealth technology discussing the above problems/concerns.    Apolonio Schneiders, FNP

## 2021-07-10 ENCOUNTER — Encounter: Payer: Self-pay | Admitting: Family

## 2021-07-10 ENCOUNTER — Encounter: Payer: Self-pay | Admitting: Internal Medicine

## 2021-07-13 ENCOUNTER — Encounter: Payer: Self-pay | Admitting: Internal Medicine

## 2021-07-13 ENCOUNTER — Telehealth: Payer: Self-pay | Admitting: Internal Medicine

## 2021-07-13 NOTE — Telephone Encounter (Signed)
Agree with stopping medication.  Please see how she is doing. Confirm no lip or tongue swelling or difficulty breathing, etc.  If any acute symptoms, needs to be reevaluated.

## 2021-07-13 NOTE — Telephone Encounter (Signed)
See my attached message for response.  Would recommend stopping the medication.  Need to know if any acute symptoms, for example sob, lip or tongue swelling, etc.  Needs to be reevaluated if any acute symptoms.

## 2021-07-13 NOTE — Telephone Encounter (Signed)
See my chart message on [facility] [place] [place] [facility]. Duplicate message.  I had sent the my chart message Kristel.

## 2021-07-13 NOTE — Telephone Encounter (Signed)
Stop medication if has rash is rash like hives ? If so can take benadryl as needed  Sch f/u with PCP asap

## 2021-07-13 NOTE — Telephone Encounter (Signed)
Patient called back and demanded the providers response. See other patient advice request.

## 2021-07-13 NOTE — Telephone Encounter (Signed)
Patient has been taking the antiviral medication, today she woke up with a rash. Patient would like to know if she should continue medication.

## 2021-07-13 NOTE — Telephone Encounter (Signed)
Hi Dr Nicki Reaper, I have had Covid since Sunday 7/31...did a virtual MyChart visit that day and was prescribed Paxlovid which I started on Monday 8/1. This morning I have a rash on my back with red spots/bumps. I am concerned this could be coming from this medicine and want to check if I should continue it. My Covid symptoms seem to be improving though. Thank you.

## 2021-07-13 NOTE — Telephone Encounter (Signed)
FYI.  I called Kelly Kelly Tanner and discussed her rash, current covid symptoms and her my chart message and events of today.  She is feeling better.  Did not take paxlovid today.  Rash - on back.  Looks similar to previous rash she had with septra.  Cough is better.  Still some nasal congestion and cough, but improved.  Feeling better.  No lip or tongue swelling.  Will remain off paxlovid.  Nasacort nasal spray and saline nasal spray as directed.  Discussed robitussin, mucinex and delsym.  Discussed to call/message with update.  My chart message sent to Kelly Tanner.  Her questions were answered and she was comfortable with this plan.

## 2021-07-14 NOTE — Telephone Encounter (Signed)
Contacted the Patient to discuss complaint. Patient was very frustrated she stated as she could not understand when all she wanted to know if it was normal to take more than 8hrs to answer patients questions when they send a [facility] message. I did apologize to the patient if she felt we delayed her care but was thankful we were able to help. Patient did say that Dr. Nicki Reaper resolved her issue and she appreciated me calling her back to check on her and to see if there is anything else I can do for her. No other follow up needed.

## 2021-07-20 NOTE — Telephone Encounter (Signed)
See more recent note

## 2021-10-03 DIAGNOSIS — Z1231 Encounter for screening mammogram for malignant neoplasm of breast: Secondary | ICD-10-CM | POA: Diagnosis not present

## 2021-10-03 LAB — HM MAMMOGRAPHY

## 2021-10-31 DIAGNOSIS — D2262 Melanocytic nevi of left upper limb, including shoulder: Secondary | ICD-10-CM | POA: Diagnosis not present

## 2021-10-31 DIAGNOSIS — D2271 Melanocytic nevi of right lower limb, including hip: Secondary | ICD-10-CM | POA: Diagnosis not present

## 2021-10-31 DIAGNOSIS — D2261 Melanocytic nevi of right upper limb, including shoulder: Secondary | ICD-10-CM | POA: Diagnosis not present

## 2021-10-31 DIAGNOSIS — Z85828 Personal history of other malignant neoplasm of skin: Secondary | ICD-10-CM | POA: Diagnosis not present

## 2021-12-27 ENCOUNTER — Other Ambulatory Visit: Payer: Federal, State, Local not specified - PPO

## 2021-12-29 ENCOUNTER — Ambulatory Visit: Payer: Federal, State, Local not specified - PPO | Admitting: Internal Medicine

## 2022-01-01 ENCOUNTER — Other Ambulatory Visit: Payer: Self-pay

## 2022-01-01 ENCOUNTER — Other Ambulatory Visit (INDEPENDENT_AMBULATORY_CARE_PROVIDER_SITE_OTHER): Payer: Federal, State, Local not specified - PPO

## 2022-01-01 DIAGNOSIS — E7800 Pure hypercholesterolemia, unspecified: Secondary | ICD-10-CM | POA: Diagnosis not present

## 2022-01-01 DIAGNOSIS — R319 Hematuria, unspecified: Secondary | ICD-10-CM | POA: Diagnosis not present

## 2022-01-01 LAB — HEPATIC FUNCTION PANEL
ALT: 19 U/L (ref 0–35)
AST: 18 U/L (ref 0–37)
Albumin: 4.4 g/dL (ref 3.5–5.2)
Alkaline Phosphatase: 98 U/L (ref 39–117)
Bilirubin, Direct: 0.1 mg/dL (ref 0.0–0.3)
Total Bilirubin: 0.7 mg/dL (ref 0.2–1.2)
Total Protein: 7.6 g/dL (ref 6.0–8.3)

## 2022-01-01 LAB — BASIC METABOLIC PANEL
BUN: 13 mg/dL (ref 6–23)
CO2: 26 mEq/L (ref 19–32)
Calcium: 9.4 mg/dL (ref 8.4–10.5)
Chloride: 103 mEq/L (ref 96–112)
Creatinine, Ser: 0.73 mg/dL (ref 0.40–1.20)
GFR: 89.06 mL/min (ref >60.00)
Glucose, Bld: 97 mg/dL (ref 70–99)
Potassium: 3.9 mEq/L (ref 3.5–5.1)
Sodium: 139 mEq/L (ref 135–145)

## 2022-01-01 LAB — URINALYSIS, ROUTINE W REFLEX MICROSCOPIC
Bilirubin Urine: NEGATIVE
Ketones, ur: NEGATIVE
Leukocytes,Ua: NEGATIVE
Nitrite: NEGATIVE
Specific Gravity, Urine: 1.015 (ref 1.000–1.030)
Total Protein, Urine: NEGATIVE
Urine Glucose: NEGATIVE
Urobilinogen, UA: 0.2 (ref 0.0–1.0)
pH: 6.5 (ref 5.0–8.0)

## 2022-01-01 LAB — LIPID PANEL
Cholesterol: 195 mg/dL (ref 0–200)
HDL: 36.9 mg/dL — ABNORMAL LOW (ref >39.00)
LDL Cholesterol: 130 mg/dL — ABNORMAL HIGH (ref 0–99)
NonHDL: 158.55
Total CHOL/HDL Ratio: 5
Triglycerides: 141 mg/dL (ref 0.0–149.0)
VLDL: 28.2 mg/dL (ref 0.0–40.0)

## 2022-01-03 ENCOUNTER — Encounter: Payer: Self-pay | Admitting: Internal Medicine

## 2022-01-03 ENCOUNTER — Ambulatory Visit: Payer: Federal, State, Local not specified - PPO | Admitting: Internal Medicine

## 2022-01-03 ENCOUNTER — Other Ambulatory Visit: Payer: Self-pay

## 2022-01-03 VITALS — BP 124/70 | HR 85 | Temp 97.9°F | Resp 16 | Ht 66.0 in | Wt 157.0 lb

## 2022-01-03 DIAGNOSIS — Z23 Encounter for immunization: Secondary | ICD-10-CM | POA: Diagnosis not present

## 2022-01-03 DIAGNOSIS — E7800 Pure hypercholesterolemia, unspecified: Secondary | ICD-10-CM | POA: Diagnosis not present

## 2022-01-03 DIAGNOSIS — F439 Reaction to severe stress, unspecified: Secondary | ICD-10-CM

## 2022-01-03 DIAGNOSIS — Z800 Family history of malignant neoplasm of digestive organs: Secondary | ICD-10-CM

## 2022-01-03 DIAGNOSIS — Z114 Encounter for screening for human immunodeficiency virus [HIV]: Secondary | ICD-10-CM | POA: Diagnosis not present

## 2022-01-03 DIAGNOSIS — D649 Anemia, unspecified: Secondary | ICD-10-CM

## 2022-01-03 DIAGNOSIS — Z1159 Encounter for screening for other viral diseases: Secondary | ICD-10-CM | POA: Diagnosis not present

## 2022-01-03 MED ORDER — ALPRAZOLAM 0.25 MG PO TABS: ORAL_TABLET | ORAL | 0 refills | Status: DC

## 2022-01-03 NOTE — Progress Notes (Deleted)
Patient ID: Kelly Tanner, female   DOB: 1961-10-31, 60 y.o.   MRN: [id number] .c

## 2022-01-03 NOTE — Assessment & Plan Note (Addendum)
The 10-year ASCVD risk score (Arnett DK, et al., 2019) is: 4%   Values used to calculate the score:     Age: 61 years     Sex: Female     Is Non-Hispanic African American: No     Diabetic: No     Tobacco smoker: No     Systolic Blood Pressure: 469 mmHg     Is BP treated: No     HDL Cholesterol: 36.9 mg/dL     Total Cholesterol: 195 mg/dL  Low cholesterol diet and exercise.  Follow lipid panel.

## 2022-01-03 NOTE — Progress Notes (Signed)
Patient ID: Kelly Tanner, female   DOB: September 05, 1961, 61 y.o.   MRN: [id number]   Subjective:    Patient ID: Kelly Tanner, female    DOB: 26-Jul-1961, 61 y.o.   MRN: [id number]  This visit occurred during the SARS-CoV-2 public health emergency.  Safety protocols were in place, including screening questions prior to the visit, additional usage of staff PPE, and extensive cleaning of exam room while observing appropriate contact time as indicated for disinfecting solutions.   Patient here for a scheduled follow up .   HPI Here to follow up regarding increased stress and cholesterol.  Overall appears to be handling things well.  Does not feel needs any further intervention.  No chest pain or sob reported.  No abdominal pain or bowel change reported.  Request refill xanax to have if needed.     Past Medical History:  Diagnosis Date   Basal cell carcinoma    Hyperlipidemia    Past Surgical History:  Procedure Laterality Date   CYST REMOVAL NECK     REFRACTIVE SURGERY     SKIN CANCER EXCISION  2001   basal cell   Family History  Problem Relation Age of Onset   Lung cancer Other        parent   Colon cancer Other        parent   Social History   Socioeconomic History   Marital status: Married    Spouse name: Not on file   Number of children: 2   Years of education: Not on file   Highest education level: Not on file  Occupational History   Not on file  Tobacco Use   Smoking status: Never   Smokeless tobacco: Never  Substance and Sexual Activity   Alcohol use: Yes    Alcohol/week: 0.0 standard drinks   Drug use: No   Sexual activity: Not on file  Other Topics Concern   Not on file  Social History Narrative   Lives at home in [place].    Married with kids   Social Determinants of Health   Financial Resource Strain: Not on file  Food Insecurity: Not on file  Transportation Needs: Not on file  Physical Activity: Not on file  Stress: Not on file  Social  Connections: Not on file     Review of Systems  Constitutional:  Negative for appetite change and unexpected weight change.  HENT:  Negative for congestion and sneezing.   Respiratory:  Negative for cough, chest tightness and shortness of breath.   Cardiovascular:  Negative for chest pain, palpitations and leg swelling.  Gastrointestinal:  Negative for abdominal pain, diarrhea, nausea and vomiting.  Genitourinary:  Negative for difficulty urinating and dysuria.  Musculoskeletal:  Negative for joint swelling and myalgias.  Skin:  Negative for color change and rash.  Neurological:  Negative for dizziness, light-headedness and headaches.  Psychiatric/Behavioral:  Negative for agitation and dysphoric mood.       Objective:     BP 124/70    Pulse 85    Temp 97.9 F (36.6 C)    Resp 16    Ht 5\' 6"  (1.676 m)    Wt 157 lb (71.2 kg)    LMP 11/02/2010    SpO2 99%    BMI 25.34 kg/m  Wt Readings from Last 3 Encounters:  01/03/22 157 lb (71.2 kg)  06/28/21 155 lb 12.8 oz (70.7 kg)  12/21/20 154 lb 5 oz (70 kg)    Physical  Exam Vitals reviewed.  Constitutional:      General: She is not in acute distress.    Appearance: Normal appearance.  HENT:     Head: Normocephalic and atraumatic.     Right Ear: External ear normal.     Left Ear: External ear normal.  Eyes:     General: No scleral icterus.       Right eye: No discharge.        Left eye: No discharge.     Conjunctiva/sclera: Conjunctivae normal.  Neck:     Thyroid: No thyromegaly.  Cardiovascular:     Rate and Rhythm: Normal rate and regular rhythm.  Pulmonary:     Effort: No respiratory distress.     Breath sounds: Normal breath sounds. No wheezing.  Abdominal:     General: Bowel sounds are normal.     Palpations: Abdomen is soft.     Tenderness: There is no abdominal tenderness.  Musculoskeletal:        General: No swelling or tenderness.     Cervical back: Neck supple. No tenderness.  Lymphadenopathy:     Cervical: No  cervical adenopathy.  Skin:    Findings: No erythema or rash.  Neurological:     Mental Status: She is alert.  Psychiatric:        Mood and Affect: Mood normal.        Behavior: Behavior normal.     Outpatient Encounter Medications as of 01/03/2022  Medication Sig   ALPRAZolam (XANAX) 0.25 MG tablet Take one tablet q day prn   estradiol (ESTRACE) 0.1 MG/GM vaginal cream As directed.   triamcinolone (KENALOG) 0.1 % Apply topically.   [DISCONTINUED] ALPRAZolam (XANAX) 0.25 MG tablet Take one tablet q day prn   No facility-administered encounter medications on file as of 01/03/2022.     Lab Results  Component Value Date   WBC 9.0 06/08/2021   HGB 12.8 06/08/2021   HCT 37.7 06/08/2021   PLT 252.0 06/08/2021   GLUCOSE 97 01/01/2022   CHOL 195 01/01/2022   TRIG 141.0 01/01/2022   HDL 36.90 (L) 01/01/2022   LDLDIRECT 101.0 05/19/2018   LDLCALC 130 (H) 01/01/2022   ALT 19 01/01/2022   AST 18 01/01/2022   NA 139 01/01/2022   K 3.9 01/01/2022   CL 103 01/01/2022   CREATININE 0.73 01/01/2022   BUN 13 01/01/2022   CO2 26 01/01/2022   TSH 2.92 06/08/2021       Assessment & Plan:   Problem List Items Addressed This Visit     Anemia - Primary    Follow cbc.       Relevant Orders   CBC with Differential/Platelet   Family history of colon cancer    Colonoscopy 05/2018.  No polyps.  Recommended f/u in 5 years.        Hypercholesterolemia    The 10-year ASCVD risk score (Arnett DK, et al., 2019) is: 4%   Values used to calculate the score:     Age: 21 years     Sex: Female     Is Non-Hispanic African American: No     Diabetic: No     Tobacco smoker: No     Systolic Blood Pressure: 124 mmHg     Is BP treated: No     HDL Cholesterol: 36.9 mg/dL     Total Cholesterol: 195 mg/dL  Low cholesterol diet and exercise.  Follow lipid panel.       Relevant Orders  Lipid panel   Basic metabolic panel   Hepatic function panel   TSH   Stress    Overall appears to be doing  relatively well.  Follow. Request refill of xanax to have if needed.        Relevant Medications   ALPRAZolam (XANAX) 0.25 MG tablet   Other Visit Diagnoses     Encounter for hepatitis C screening test for low risk patient       Relevant Orders   Hepatitis C antibody   Screening for HIV without presence of risk factors       Relevant Orders   HIV Antibody (routine testing w rflx)   Need for immunization against influenza       Relevant Orders   Flu Vaccine QUAD 89mo+IM (Fluarix, Fluzone & Alfiuria Quad PF) (Completed)        Einar Pheasant, MD

## 2022-01-14 ENCOUNTER — Encounter: Payer: Self-pay | Admitting: Internal Medicine

## 2022-01-14 NOTE — Assessment & Plan Note (Signed)
Colonoscopy 05/2018.  No polyps.  Recommended f/u in 5 years.   

## 2022-01-14 NOTE — Assessment & Plan Note (Signed)
Follow cbc.  

## 2022-01-14 NOTE — Assessment & Plan Note (Signed)
Overall appears to be doing relatively well.  Follow. Request refill of xanax to have if needed.

## 2022-03-28 ENCOUNTER — Telehealth: Payer: Federal, State, Local not specified - PPO | Admitting: Nurse Practitioner

## 2022-03-28 DIAGNOSIS — J40 Bronchitis, not specified as acute or chronic: Secondary | ICD-10-CM

## 2022-03-28 MED ORDER — BENZONATATE 100 MG PO CAPS: 100.0000 mg | ORAL_CAPSULE | Freq: Three times a day (TID) | ORAL | 0 refills | Status: DC | PRN

## 2022-03-28 MED ORDER — PREDNISONE 10 MG (21) PO TBPK: ORAL_TABLET | ORAL | 0 refills | Status: DC

## 2022-03-28 NOTE — Progress Notes (Signed)
?Virtual Visit Consent  ? ?Kelly Tanner, you are scheduled for a virtual visit with a [facility] provider today.   ?  ?Just as with appointments in the office, your consent must be obtained to participate.  Your consent will be active for this visit and any virtual visit you may have with one of our providers in the next 365 days.   ?  ?If you have a MyChart account, a copy of this consent can be sent to you electronically.  All virtual visits are billed to your insurance company just like a traditional visit in the office.   ? ?As this is a virtual visit, video technology does not allow for your provider to perform a traditional examination.  This may limit your provider's ability to fully assess your condition.  If your provider identifies any concerns that need to be evaluated in person or the need to arrange testing (such as labs, EKG, etc.), we will make arrangements to do so.   ?  ?Although advances in technology are sophisticated, we cannot ensure that it will always work on either your end or our end.  If the connection with a video visit is poor, the visit may have to be switched to a telephone visit.  With either a video or telephone visit, we are not always able to ensure that we have a secure connection.    ? ?I need to obtain your verbal consent now.   Are you willing to proceed with your visit today?  ?  ?Kelly Tanner has provided verbal consent on 03/28/2022 for a virtual visit (video or telephone). ?  ?Kelly Schneiders, FNP  ? ?Date: 03/28/2022 7:51 AM ? ? ?Virtual Visit via Video Note  ? ?IApolonio Tanner, connected with  Kelly Tanner  ([id number], June 11, 1961) on 03/28/22 at  8:00 AM EDT by a video-enabled telemedicine application and verified that I am speaking with the correct person using two identifiers. ? ?Location: ?Patient: Virtual Visit Location Patient: Home ?Provider: Virtual Visit Location Provider: Home Office ?  ?I discussed the limitations of evaluation and management by telemedicine  and the availability of in person appointments. The patient expressed understanding and agreed to proceed.   ? ?History of Present Illness: ?Kelly Tanner is a 61 y.o. who identifies as a female who was assigned female at birth, and is being seen today with complaints of a cough that  kept her up all night.  ? ?She was exposed to her grand daughter who was also sick with similar symptoms and was negative for COVID  ? ?Patient took a COVID test this morning that was negative  ? ?Her worst complaint today is a dry cough that is keeping her up and causing her to be incontinent at times  ? ?She tried delsym and benzonatate have not been helpful.  ? ?She did have COVID last July  ?She notes that when she suffers from a URI her cough is what persists.  ? ?Problems:  ?Patient Active Problem List  ? Diagnosis Date Noted  ? Cough 02/26/2021  ? Nasal congestion 12/25/2020  ? Postmenopausal vaginal bleeding 12/10/2020  ? RUQ pain 12/20/2019  ? Leukocytosis 12/20/2019  ? Right shoulder pain 12/20/2019  ? Sleep difficulties 09/07/2019  ? Sore throat 09/09/2017  ? GERD (gastroesophageal reflux disease) 05/16/2017  ? Diarrhea 09/18/2015  ? Health care maintenance 04/17/2015  ? Elevated blood pressure reading 08/23/2014  ? Stress 04/06/2014  ? Anemia 10/11/2013  ? Hematuria 04/23/2013  ?  Hypercholesterolemia 01/04/2013  ? Skin cancer 01/04/2013  ? Family history of colon cancer 01/04/2013  ? Interstitial cystitis 01/04/2013  ?  ?Allergies:  ?Allergies  ?Allergen Reactions  ? Sulfa Antibiotics   ? ?Medications:  ?Current Outpatient Medications:  ?  ALPRAZolam (XANAX) 0.25 MG tablet, Take one tablet q day prn, Disp: 30 tablet, Rfl: 0 ?  estradiol (ESTRACE) 0.1 MG/GM vaginal cream, As directed., Disp: 42.5 g, Rfl: 1 ?  triamcinolone (KENALOG) 0.1 %, Apply topically., Disp: , Rfl:  ? ?Observations/Objective: ?Patient is well-developed, well-nourished in no acute distress.  ?Resting comfortably at home.  ?Head is normocephalic,  atraumatic.  ?No labored breathing.  ?Speech is clear and coherent with logical content.  ?Patient is alert and oriented at baseline.  ? ? ?Assessment and Plan: ?1. Bronchitis ? ?- benzonatate (TESSALON) 100 MG capsule; Take 1 capsule (100 mg total) by mouth 3 (three) times daily as needed.  Dispense: 30 capsule; Refill: 0 ?- predniSONE (STERAPRED UNI-PAK 21 TAB) 10 MG (21) TBPK tablet; Take 6 tablets on day one, 5 on day two, 4 on day three, 3 on day four, 2 on day five, and 1 on day six. Take with food.  Dispense: 21 tablet; Refill: 0 ?   ? ?Follow Up Instructions: ?I discussed the assessment and treatment plan with the patient. The patient was provided an opportunity to ask questions and all were answered. The patient agreed with the plan and demonstrated an understanding of the instructions.  A copy of instructions were sent to the patient via MyChart unless otherwise noted below.  ? ?The patient was advised to call back or seek an in-person evaluation if the symptoms worsen or if the condition fails to improve as anticipated. ? ?Time:  ?I spent 10 minutes with the patient via telehealth technology discussing the above problems/concerns.   ? ?Kelly Schneiders, FNP  ?

## 2022-03-29 ENCOUNTER — Encounter: Payer: Self-pay | Admitting: Internal Medicine

## 2022-03-29 MED ORDER — HYDROCOD POLI-CHLORPHE POLI ER 10-8 MG/5ML PO SUER: 5.0000 mL | Freq: Two times a day (BID) | ORAL | 0 refills | Status: DC | PRN

## 2022-03-29 NOTE — Telephone Encounter (Signed)
Lm for pt

## 2022-03-29 NOTE — Telephone Encounter (Signed)
Pt returning call

## 2022-03-29 NOTE — Telephone Encounter (Signed)
Please call and confirm no sob.  Also, has she taken tussionex previously and tolerated.  Also, will need to keep [place] posted.  Let me know about symptoms and if taken and tolerated - and will get in rx.  ?

## 2022-03-29 NOTE — Telephone Encounter (Signed)
Rx ok'd for tussionex. Pt notified via my chart.  ?

## 2022-04-04 NOTE — Telephone Encounter (Signed)
Pt has been scheduled and provider and pt are aware.  ?

## 2022-04-04 NOTE — Telephone Encounter (Signed)
Please see if she can do a virtual visit at 3:00 tomorrow.   ?

## 2022-04-05 ENCOUNTER — Telehealth (INDEPENDENT_AMBULATORY_CARE_PROVIDER_SITE_OTHER): Payer: Federal, State, Local not specified - PPO | Admitting: Internal Medicine

## 2022-04-05 ENCOUNTER — Telehealth: Payer: Federal, State, Local not specified - PPO | Admitting: Internal Medicine

## 2022-04-05 ENCOUNTER — Encounter: Payer: Self-pay | Admitting: Internal Medicine

## 2022-04-05 DIAGNOSIS — J029 Acute pharyngitis, unspecified: Secondary | ICD-10-CM

## 2022-04-05 DIAGNOSIS — R059 Cough, unspecified: Secondary | ICD-10-CM | POA: Diagnosis not present

## 2022-04-05 MED ORDER — AZITHROMYCIN 250 MG PO TABS: ORAL_TABLET | ORAL | 0 refills | Status: AC

## 2022-04-06 ENCOUNTER — Telehealth: Payer: Federal, State, Local not specified - PPO | Admitting: Internal Medicine

## 2022-04-09 ENCOUNTER — Encounter: Payer: Self-pay | Admitting: Internal Medicine

## 2022-04-09 NOTE — Assessment & Plan Note (Signed)
Resolved.  Treat cough and congestion as outlined.  ?

## 2022-04-09 NOTE — Progress Notes (Signed)
Patient ID: Kelly Tanner, female   DOB: 1961/07/15, 61 y.o.   MRN: [id number] ? ? ?Virtual Visit via video Note ? ?I connected with Keiona Jenison today by video and verified that I am speaking with the correct person using two identifiers. ?Location patient: home ?Location provider: work ?Persons participating in the video visit: patient, provider ? ?Tje limitations, risks, security and privacy concerns of performing an evaluation and management service by video and the availability of in person appointments have been discussed.  It has also been discussed with the patient that there may be a patient responsible charge related to this service. The patient expressed understanding and agreed to proceed. ? ?Reason for visit: work in appt ? ?HPI: ?Work in with concerns regarding persistent cough/congestion.  Visited with sick grandchild two weeks ago. Evaluated 03/28/22 - for cough and congestion.  Home covid test negative.  Was diagnosed with bronchitis.  Treated with tessalon perles and prednisone taper.  She continued to have increased cough and was prescribed tussionex.  This helped the nighttime cough.  She was feeling some better.  Noticed this past weekend, the cough - more productive.  Colored mucus.  Nose bleed.  Finished steroid Monday.  Had sore throat this week.  Better today.  No sob.  Eating.  No vomiting.   ? ? ?ROS: See pertinent positives and negatives per HPI. ? ?Past Medical History:  ?Diagnosis Date  ? Basal cell carcinoma   ? Hyperlipidemia   ? ? ?Past Surgical History:  ?Procedure Laterality Date  ? CYST REMOVAL NECK    ? REFRACTIVE SURGERY    ? SKIN CANCER EXCISION  2001  ? basal cell  ? ? ?Family History  ?Problem Relation Age of Onset  ? Lung cancer Other   ?     parent  ? Colon cancer Other   ?     parent  ? ? ?SOCIAL HX: reviewed.  ? ? ?Current Outpatient Medications:  ?  ALPRAZolam (XANAX) 0.25 MG tablet, Take one tablet q day prn, Disp: 30 tablet, Rfl: 0 ?  azithromycin (ZITHROMAX) 250 MG  tablet, Take 2 tablets on day 1, then 1 tablet daily on days 2 through 5, Disp: 6 tablet, Rfl: 0 ?  estradiol (ESTRACE) 0.1 MG/GM vaginal cream, As directed., Disp: 42.5 g, Rfl: 1 ?  triamcinolone (KENALOG) 0.1 %, Apply topically., Disp: , Rfl:  ? ?EXAM: ? ?GENERAL: alert, oriented, appears well and in no acute distress ? ?HEENT: atraumatic, conjunttiva clear, no obvious abnormalities on inspection of external nose and ears ? ?NECK: normal movements of the head and neck ? ?LUNGS: on inspection no signs of respiratory distress, breathing rate appears normal, no obvious gross SOB, gasping or wheezing ? ?CV: no obvious cyanosis ? ?PSYCH/NEURO: pleasant and cooperative, no obvious depression or anxiety, speech and thought processing grossly intact ? ?ASSESSMENT AND PLAN: ? ?Discussed the following assessment and plan: ? ?Problem List Items Addressed This Visit   ? ? Cough  ?  Recently diagnosed with bronchitis.  Treated with tessalon perles and steroid taper.  Persistent symptoms with cough and congestion - colored mucus.  Discussed using saline nasal spray.  Robitussin DM as directed.  zpak as directed.  Discussed her history of c.diff.  Discussed taking probiotics and eating yogurt.  Follow closely.  Call with update.   ? ?  ?  ? Sore throat  ?  Resolved.  Treat cough and congestion as outlined.  ? ?  ?  ? ? ?  Return if symptoms worsen or fail to improve, for keep scheduled. ?  ?I discussed the assessment and treatment plan with the patient. The patient was provided an opportunity to ask questions and all were answered. The patient agreed with the plan and demonstrated an understanding of the instructions. ?  ?The patient was advised to call back or seek an in-person evaluation if the symptoms worsen or if the condition fails to improve as anticipated. ? ? ? ?Einar Pheasant, MD   ?

## 2022-04-09 NOTE — Assessment & Plan Note (Signed)
Recently diagnosed with bronchitis.  Treated with tessalon perles and steroid taper.  Persistent symptoms with cough and congestion - colored mucus.  Discussed using saline nasal spray.  Robitussin DM as directed.  zpak as directed.  Discussed her history of c.diff.  Discussed taking probiotics and eating yogurt.  Follow closely.  Call with update.   ?

## 2022-05-03 DIAGNOSIS — D2272 Melanocytic nevi of left lower limb, including hip: Secondary | ICD-10-CM | POA: Diagnosis not present

## 2022-05-03 DIAGNOSIS — D2262 Melanocytic nevi of left upper limb, including shoulder: Secondary | ICD-10-CM | POA: Diagnosis not present

## 2022-05-03 DIAGNOSIS — D2261 Melanocytic nevi of right upper limb, including shoulder: Secondary | ICD-10-CM | POA: Diagnosis not present

## 2022-05-03 DIAGNOSIS — D225 Melanocytic nevi of trunk: Secondary | ICD-10-CM | POA: Diagnosis not present

## 2022-06-28 ENCOUNTER — Encounter: Payer: Self-pay | Admitting: Internal Medicine

## 2022-07-03 ENCOUNTER — Other Ambulatory Visit (INDEPENDENT_AMBULATORY_CARE_PROVIDER_SITE_OTHER): Payer: Federal, State, Local not specified - PPO

## 2022-07-03 DIAGNOSIS — Z114 Encounter for screening for human immunodeficiency virus [HIV]: Secondary | ICD-10-CM | POA: Diagnosis not present

## 2022-07-03 DIAGNOSIS — E7800 Pure hypercholesterolemia, unspecified: Secondary | ICD-10-CM | POA: Diagnosis not present

## 2022-07-03 DIAGNOSIS — Z1159 Encounter for screening for other viral diseases: Secondary | ICD-10-CM | POA: Diagnosis not present

## 2022-07-03 DIAGNOSIS — D649 Anemia, unspecified: Secondary | ICD-10-CM

## 2022-07-03 LAB — CBC WITH DIFFERENTIAL/PLATELET
Basophils Absolute: 0 10*3/uL (ref 0.0–0.1)
Basophils Relative: 0.4 % (ref 0.0–3.0)
Eosinophils Absolute: 0.1 10*3/uL (ref 0.0–0.7)
Eosinophils Relative: 0.9 % (ref 0.0–5.0)
HCT: 37.8 % (ref 36.0–46.0)
Hemoglobin: 12.6 g/dL (ref 12.0–15.0)
Lymphocytes Relative: 14.8 % (ref 12.0–46.0)
Lymphs Abs: 1.4 10*3/uL (ref 0.7–4.0)
MCHC: 33.3 g/dL (ref 30.0–36.0)
MCV: 90.5 fl (ref 78.0–100.0)
Monocytes Absolute: 0.4 10*3/uL (ref 0.1–1.0)
Monocytes Relative: 4.6 % (ref 3.0–12.0)
Neutro Abs: 7.5 10*3/uL (ref 1.4–7.7)
Neutrophils Relative %: 79.3 % — ABNORMAL HIGH (ref 43.0–77.0)
Platelets: 259 10*3/uL (ref 150.0–400.0)
RBC: 4.17 Mil/uL (ref 3.87–5.11)
RDW: 14.1 % (ref 11.5–15.5)
WBC: 9.5 10*3/uL (ref 4.0–10.5)

## 2022-07-03 LAB — LIPID PANEL
Cholesterol: 188 mg/dL (ref 0–200)
HDL: 38.1 mg/dL — ABNORMAL LOW (ref >39.00)
LDL Cholesterol: 119 mg/dL — ABNORMAL HIGH (ref 0–99)
NonHDL: 150.06
Total CHOL/HDL Ratio: 5
Triglycerides: 154 mg/dL — ABNORMAL HIGH (ref 0.0–149.0)
VLDL: 30.8 mg/dL (ref 0.0–40.0)

## 2022-07-03 LAB — BASIC METABOLIC PANEL
BUN: 12 mg/dL (ref 6–23)
CO2: 26 mEq/L (ref 19–32)
Calcium: 9.6 mg/dL (ref 8.4–10.5)
Chloride: 102 mEq/L (ref 96–112)
Creatinine, Ser: 0.86 mg/dL (ref 0.40–1.20)
GFR: 72.9 mL/min (ref >60.00)
Glucose, Bld: 91 mg/dL (ref 70–99)
Potassium: 4.1 mEq/L (ref 3.5–5.1)
Sodium: 136 mEq/L (ref 135–145)

## 2022-07-03 LAB — HEPATIC FUNCTION PANEL
ALT: 19 U/L (ref 0–35)
AST: 19 U/L (ref 0–37)
Albumin: 4.6 g/dL (ref 3.5–5.2)
Alkaline Phosphatase: 115 U/L (ref 39–117)
Bilirubin, Direct: 0.1 mg/dL (ref 0.0–0.3)
Total Bilirubin: 0.5 mg/dL (ref 0.2–1.2)
Total Protein: 7.9 g/dL (ref 6.0–8.3)

## 2022-07-03 LAB — TSH: TSH: 1.84 u[IU]/mL (ref 0.35–5.50)

## 2022-07-04 LAB — HEPATITIS C ANTIBODY: Hepatitis C Ab: NONREACTIVE

## 2022-07-04 LAB — HIV ANTIBODY (ROUTINE TESTING W REFLEX): HIV 1&2 Ab, 4th Generation: NONREACTIVE

## 2022-07-05 ENCOUNTER — Encounter: Payer: Self-pay | Admitting: Internal Medicine

## 2022-07-05 ENCOUNTER — Ambulatory Visit (INDEPENDENT_AMBULATORY_CARE_PROVIDER_SITE_OTHER): Payer: Federal, State, Local not specified - PPO | Admitting: Internal Medicine

## 2022-07-05 VITALS — BP 128/78 | HR 96 | Temp 98.0°F | Ht 66.0 in | Wt 157.6 lb

## 2022-07-05 DIAGNOSIS — M2550 Pain in unspecified joint: Secondary | ICD-10-CM

## 2022-07-05 DIAGNOSIS — Z0000 Encounter for general adult medical examination without abnormal findings: Secondary | ICD-10-CM | POA: Diagnosis not present

## 2022-07-05 DIAGNOSIS — Z800 Family history of malignant neoplasm of digestive organs: Secondary | ICD-10-CM

## 2022-07-05 DIAGNOSIS — N3010 Interstitial cystitis (chronic) without hematuria: Secondary | ICD-10-CM | POA: Diagnosis not present

## 2022-07-05 DIAGNOSIS — D649 Anemia, unspecified: Secondary | ICD-10-CM | POA: Diagnosis not present

## 2022-07-05 DIAGNOSIS — F439 Reaction to severe stress, unspecified: Secondary | ICD-10-CM

## 2022-07-05 DIAGNOSIS — R000 Tachycardia, unspecified: Secondary | ICD-10-CM

## 2022-07-05 DIAGNOSIS — E7800 Pure hypercholesterolemia, unspecified: Secondary | ICD-10-CM

## 2022-07-05 NOTE — Progress Notes (Signed)
Patient ID: Kelly Tanner, female   DOB: [birth year], 61 y.o.   MRN: [id number]   Subjective:    Patient ID: Kelly Tanner, female    DOB: Jul 31, 1961, 61 y.o.   MRN: [id number]   Patient here for her physical exam.   Chief Complaint  Patient presents with   Annual Exam   .   HPI Increased stress.  She is staying with her father on the weekends.  Trying to work and help take care of him.  Discussed increased stress.  Request xanax to have prn.  Has been sleeping on the cough at his house.  Increased discomfort hips, neck and thumbs. Tries to stay active.  No chest pain or sob reported.  No abdominal pain.  Bowels moving.     Past Medical History:  Diagnosis Date   Basal cell carcinoma    Hyperlipidemia    Past Surgical History:  Procedure Laterality Date   CYST REMOVAL NECK     REFRACTIVE SURGERY     SKIN CANCER EXCISION  2001   basal cell   Family History  Problem Relation Age of Onset   Lung cancer Other        parent   Colon cancer Other        parent   Social History   Socioeconomic History   Marital status: Married    Spouse name: Not on file   Number of children: 2   Years of education: Not on file   Highest education level: Not on file  Occupational History   Not on file  Tobacco Use   Smoking status: Never   Smokeless tobacco: Never  Substance and Sexual Activity   Alcohol use: Yes    Alcohol/week: 0.0 standard drinks of alcohol   Drug use: No   Sexual activity: Not on file  Other Topics Concern   Not on file  Social History Narrative   Lives at home in [place].    Married with kids   Social Determinants of Health   Financial Resource Strain: Not on file  Food Insecurity: Not on file  Transportation Needs: Not on file  Physical Activity: Not on file  Stress: Not on file  Social Connections: Not on file     Review of Systems  Constitutional:  Negative for appetite change and unexpected weight change.  HENT:  Negative for congestion,  sinus pressure and sore throat.   Eyes:  Negative for pain and visual disturbance.  Respiratory:  Negative for cough, chest tightness and shortness of breath.   Cardiovascular:  Negative for chest pain, palpitations and leg swelling.  Gastrointestinal:  Negative for abdominal pain, diarrhea, nausea and vomiting.  Genitourinary:  Negative for difficulty urinating and dysuria.  Musculoskeletal:  Negative for back pain and joint swelling.  Skin:  Negative for color change and rash.  Neurological:  Negative for dizziness, light-headedness and headaches.  Hematological:  Negative for adenopathy. Does not bruise/bleed easily.  Psychiatric/Behavioral:  Negative for agitation and dysphoric mood.        Increased stress as outlined.        Objective:     BP 128/78 (BP Location: Left Arm, Patient Position: Sitting, Cuff Size: Small)   Pulse 96   Temp 98 F (36.7 C) (Oral)   Ht '5\' 6"'$  (1.676 m)   Wt 157 lb 9.6 oz (71.5 kg)   LMP 11/02/2010   SpO2 99%   BMI 25.44 kg/m  Wt Readings from Last 3 Encounters:  07/05/22 157 lb 9.6 oz (71.5 kg)  04/05/22 156 lb (70.8 kg)  01/03/22 157 lb (71.2 kg)    Physical Exam Vitals reviewed.  Constitutional:      General: She is not in acute distress.    Appearance: Normal appearance. She is well-developed.  HENT:     Head: Normocephalic and atraumatic.     Right Ear: External ear normal.     Left Ear: External ear normal.  Eyes:     General: No scleral icterus.       Right eye: No discharge.        Left eye: No discharge.     Conjunctiva/sclera: Conjunctivae normal.  Neck:     Thyroid: No thyromegaly.  Cardiovascular:     Rate and Rhythm: Normal rate and regular rhythm.  Pulmonary:     Effort: No tachypnea, accessory muscle usage or respiratory distress.     Breath sounds: Normal breath sounds. No decreased breath sounds or wheezing.  Chest:  Breasts:    Right: No inverted nipple, mass, nipple discharge or tenderness (no axillary  adenopathy).     Left: No inverted nipple, mass, nipple discharge or tenderness (no axilarry adenopathy).  Abdominal:     General: Bowel sounds are normal.     Palpations: Abdomen is soft.     Tenderness: There is no abdominal tenderness.  Musculoskeletal:        General: No swelling or tenderness.     Cervical back: Neck supple.  Lymphadenopathy:     Cervical: No cervical adenopathy.  Skin:    Findings: No erythema or rash.  Neurological:     Mental Status: She is alert and oriented to person, place, and time.  Psychiatric:        Mood and Affect: Mood normal.        Behavior: Behavior normal.      Outpatient Encounter Medications as of 07/05/2022  Medication Sig   triamcinolone (KENALOG) 0.1 % Apply topically.   [DISCONTINUED] ALPRAZolam (XANAX) 0.25 MG tablet Take one tablet q day prn   [DISCONTINUED] estradiol (ESTRACE) 0.1 MG/GM vaginal cream As directed.   ALPRAZolam (XANAX) 0.25 MG tablet Take one tablet q day prn   estradiol (ESTRACE) 0.1 MG/GM vaginal cream As directed.   No facility-administered encounter medications on file as of 07/05/2022.     Lab Results  Component Value Date   WBC 9.5 07/03/2022   HGB 12.6 07/03/2022   HCT 37.8 07/03/2022   PLT 259.0 07/03/2022   GLUCOSE 91 07/03/2022   CHOL 188 07/03/2022   TRIG 154.0 (H) 07/03/2022   HDL 38.10 (L) 07/03/2022   LDLDIRECT 101.0 05/19/2018   LDLCALC 119 (H) 07/03/2022   ALT 19 07/03/2022   AST 19 07/03/2022   NA 136 07/03/2022   K 4.1 07/03/2022   CL 102 07/03/2022   CREATININE 0.86 07/03/2022   BUN 12 07/03/2022   CO2 26 07/03/2022   TSH 1.84 07/03/2022       Assessment & Plan:   Problem List Items Addressed This Visit     Anemia    Follow cbc.       Family history of colon cancer    Colonoscopy 05/2018.  No polyps.  Recommended f/u in 5 years.        Health care maintenance    Physical today 07/05/22.  Colonoscopy 01/2018 - recommended f/u in 5 years.  Mammogram 10/03/21 - Birads I  [facility]).        Hypercholesterolemia  The 10-year ASCVD risk score (Arnett DK, et al., 2019) is: 4.4%   Values used to calculate the score:     Age: 54 years     Sex: Female     Is Non-Hispanic African American: No     Diabetic: No     Tobacco smoker: No     Systolic Blood Pressure: 010 mmHg     Is BP treated: No     HDL Cholesterol: 38.1 mg/dL     Total Cholesterol: 188 mg/dL  Low cholesterol diet and exercise.  Follow lipid panel.       Relevant Orders   Hepatic function panel   Basic metabolic panel   Lipid panel   Interstitial cystitis    Has been worked up by urology.  Stable.       Joint pain    Hip, neck and thumb pain as outlined.  Sleeping on couch at her father's house.  Follow.  Notify me if desires any further intervention.  Follow.       Stress    Increased stress.  Discussed.  Does not feel needs any further intervention at this time.  Follow.        Relevant Medications   ALPRAZolam (XANAX) 0.25 MG tablet   Tachycardia    Increased stress.  Discussed.  Recheck heart rate improved.  Follow.       Other Visit Diagnoses     Routine general medical examination at a health care facility    -  Primary        Einar Pheasant, MD

## 2022-07-05 NOTE — Assessment & Plan Note (Addendum)
The 10-year ASCVD risk score (Arnett DK, et al., 2019) is: 4.4%   Values used to calculate the score:     Age: 61 years     Sex: Female     Is Non-Hispanic African American: No     Diabetic: No     Tobacco smoker: No     Systolic Blood Pressure: 403 mmHg     Is BP treated: No     HDL Cholesterol: 38.1 mg/dL     Total Cholesterol: 188 mg/dL  Low cholesterol diet and exercise.  Follow lipid panel.

## 2022-07-08 ENCOUNTER — Encounter: Payer: Self-pay | Admitting: Internal Medicine

## 2022-07-08 DIAGNOSIS — R000 Tachycardia, unspecified: Secondary | ICD-10-CM | POA: Insufficient documentation

## 2022-07-08 DIAGNOSIS — M2550 Pain in unspecified joint: Secondary | ICD-10-CM | POA: Insufficient documentation

## 2022-07-08 MED ORDER — ESTRADIOL 0.1 MG/GM VA CREA
TOPICAL_CREAM | VAGINAL | 1 refills | Status: DC
Start: 2022-07-08 — End: 2024-07-20

## 2022-07-08 MED ORDER — ALPRAZOLAM 0.25 MG PO TABS: ORAL_TABLET | ORAL | 0 refills | Status: DC

## 2022-07-08 NOTE — Assessment & Plan Note (Signed)
Increased stress.  Discussed.  Recheck heart rate improved.  Follow.

## 2022-07-08 NOTE — Assessment & Plan Note (Signed)
Physical today 07/05/22.  Colonoscopy 01/2018 - recommended f/u in 5 years.  Mammogram 10/03/21 - [facility] I [facility]).

## 2022-07-08 NOTE — Assessment & Plan Note (Signed)
Has been worked up by urology.  Stable.

## 2022-07-08 NOTE — Assessment & Plan Note (Signed)
Colonoscopy 05/2018.  No polyps.  Recommended f/u in 5 years.   

## 2022-07-08 NOTE — Assessment & Plan Note (Signed)
Increased stress.  Discussed.  Does not feel needs any further intervention at this time.  Follow.  

## 2022-07-08 NOTE — Assessment & Plan Note (Signed)
Follow cbc.  

## 2022-07-08 NOTE — Assessment & Plan Note (Signed)
Hip, neck and thumb pain as outlined.  Sleeping on couch at her father's house.  Follow.  Notify me if desires any further intervention.  Follow.

## 2022-09-04 DIAGNOSIS — M79672 Pain in left foot: Secondary | ICD-10-CM | POA: Diagnosis not present

## 2022-09-04 DIAGNOSIS — M2012 Hallux valgus (acquired), left foot: Secondary | ICD-10-CM | POA: Diagnosis not present

## 2022-09-06 DIAGNOSIS — D485 Neoplasm of uncertain behavior of skin: Secondary | ICD-10-CM | POA: Diagnosis not present

## 2022-09-06 DIAGNOSIS — L538 Other specified erythematous conditions: Secondary | ICD-10-CM | POA: Diagnosis not present

## 2022-09-06 DIAGNOSIS — L820 Inflamed seborrheic keratosis: Secondary | ICD-10-CM | POA: Diagnosis not present

## 2022-09-06 DIAGNOSIS — L570 Actinic keratosis: Secondary | ICD-10-CM | POA: Diagnosis not present

## 2022-10-09 DIAGNOSIS — Z1231 Encounter for screening mammogram for malignant neoplasm of breast: Secondary | ICD-10-CM | POA: Diagnosis not present

## 2022-10-18 ENCOUNTER — Telehealth: Payer: Federal, State, Local not specified - PPO | Admitting: Physician Assistant

## 2022-10-18 ENCOUNTER — Telehealth: Payer: Self-pay

## 2022-10-18 DIAGNOSIS — U071 COVID-19: Secondary | ICD-10-CM

## 2022-10-18 MED ORDER — BENZONATATE 100 MG PO CAPS: 100.0000 mg | ORAL_CAPSULE | Freq: Three times a day (TID) | ORAL | 0 refills | Status: DC | PRN

## 2022-10-18 MED ORDER — MOLNUPIRAVIR EUA 200MG CAPSULE: 4.0000 | ORAL_CAPSULE | Freq: Two times a day (BID) | ORAL | 0 refills | Status: AC

## 2022-10-18 NOTE — Patient Instructions (Addendum)
Kelly Tanner, thank you for joining [name], PA-C for today's virtual visit.  While this provider is not your primary care provider (PCP), if your PCP is located in our provider database this encounter information will be shared with them immediately following your visit.   [facility] account gives you access to today's visit and all your visits, tests, and labs performed at [facility] " click here if you don't have a [facility] account or go to [email or web address]  Consent: (Patient) Kelly Tanner provided verbal consent for this virtual visit at the beginning of the encounter.  Current Medications:  Current Outpatient Medications:    ALPRAZolam (XANAX) 0.25 MG tablet, Take one tablet q day prn, Disp: 30 tablet, Rfl: 0   estradiol (ESTRACE) 0.1 MG/GM vaginal cream, As directed., Disp: 42.5 g, Rfl: 1   triamcinolone (KENALOG) 0.1 %, Apply topically., Disp: , Rfl:    Medications ordered in this encounter:  No orders of the defined types were placed in this encounter.    *If you need refills on other medications prior to your next appointment, please contact your pharmacy*  Follow-Up: Call back or seek an in-person evaluation if the symptoms worsen or if the condition fails to improve as anticipated.  [place] [phone]  Other Instructions Please keep well-hydrated and get plenty of rest. Start a saline nasal rinse to flush out your nasal passages. You can use plain Mucinex to help thin congestion. If you have a humidifier, running in the bedroom at night. I want you to start OTC vitamin D3 1000 units daily, vitamin C 1000 mg daily, and a zinc supplement. Please take prescribed medications as directed.  You have been enrolled in a MyChart symptom monitoring program. Please answer these questions daily so we can keep track of how you are doing.  You were to quarantine for 5 days from onset of your  symptoms.  After day 5, if you have had no fever and you are feeling better, you can end quarantine but need to mask for an additional 5 days. After day 5 if you have a fever or are having significant symptoms, please quarantine for full 10 days.  If you note any worsening of symptoms, any significant shortness of breath or any chest pain, please seek ER evaluation ASAP.  Please do not delay care!  COVID-19: What to Do if You Are Sick If you test positive and are an older adult or someone who is at high risk of getting very sick from COVID-19, treatment may be available. Contact a healthcare provider right away after a positive test to determine if you are eligible, even if your symptoms are mild right now. You can also visit a Test to Treat location and, if eligible, receive a prescription from a provider. Don't delay: Treatment must be started within the first few days to be effective. If you have a fever, cough, or other symptoms, you might have COVID-19. Most people have mild illness and are able to recover at home. If you are sick: Keep track of your symptoms. If you have an emergency warning sign (including trouble breathing), call 911. Steps to help prevent the spread of COVID-19 if you are sick If you are sick with COVID-19 or think you might have COVID-19, follow the steps below to care for yourself and to help protect other people in your home and community. Stay home except to get medical care Stay home. Most people  with COVID-19 have mild illness and can recover at home without medical care. Do not leave your home, except to get medical care. Do not visit public areas and do not go to places where you are unable to wear a mask. Take care of yourself. Get rest and stay hydrated. Take over-the-counter medicines, such as acetaminophen, to help you feel better. Stay in touch with your doctor. Call before you get medical care. Be sure to get care if you have trouble breathing, or have any other  emergency warning signs, or if you think it is an emergency. Avoid public transportation, ride-sharing, or taxis if possible. Get tested If you have symptoms of COVID-19, get tested. While waiting for test results, stay away from others, including staying apart from those living in your household. Get tested as soon as possible after your symptoms start. Treatments may be available for people with COVID-19 who are at risk for becoming very sick. Don't delay: Treatment must be started early to be effective--some treatments must begin within 5 days of your first symptoms. Contact your healthcare provider right away if your test result is positive to determine if you are eligible. Self-tests are one of several options for testing for the virus that causes COVID-19 and may be more convenient than laboratory-based tests and point-of-care tests. Ask your healthcare provider or your local health department if you need help interpreting your test results. You can visit your state, tribal, local, and territorial health department's website to look for the latest local information on testing sites. Separate yourself from other people As much as possible, stay in a specific room and away from other people and pets in your home. If possible, you should use a separate bathroom. If you need to be around other people or animals in or outside of the home, wear a well-fitting mask. Tell your close contacts that they may have been exposed to COVID-19. An infected person can spread COVID-19 starting 48 hours (or 2 days) before the person has any symptoms or tests positive. By letting your close contacts know they may have been exposed to COVID-19, you are helping to protect everyone. See COVID-19 and Animals if you have questions about pets. If you are diagnosed with COVID-19, someone from the health department may call you. Answer the call to slow the spread. Monitor your symptoms Symptoms of COVID-19 include fever,  cough, or other symptoms. Follow care instructions from your healthcare provider and local health department. Your local health authorities may give instructions on checking your symptoms and reporting information. When to seek emergency medical attention Look for emergency warning signs* for COVID-19. If someone is showing any of these signs, seek emergency medical care immediately: Trouble breathing Persistent pain or pressure in the chest New confusion Inability to wake or stay awake Pale, gray, or blue-colored skin, lips, or nail beds, depending on skin tone *This list is not all possible symptoms. Please call your medical provider for any other symptoms that are severe or concerning to you. Call 911 or call ahead to your local emergency facility: Notify the operator that you are seeking care for someone who has or may have COVID-19. Call ahead before visiting your doctor Call ahead. Many medical visits for routine care are being postponed or done by phone or telemedicine. If you have a medical appointment that cannot be postponed, call your doctor's office, and tell them you have or may have COVID-19. This will help the office protect themselves and other patients. If you  are sick, wear a well-fitting mask You should wear a mask if you must be around other people or animals, including pets (even at home). Wear a mask with the best fit, protection, and comfort for you. You don't need to wear the mask if you are alone. If you can't put on a mask (because of trouble breathing, for example), cover your coughs and sneezes in some other way. Try to stay at least 6 feet away from other people. This will help protect the people around you. Masks should not be placed on young children under age 60 years, anyone who has trouble breathing, or anyone who is not able to remove the mask without help. Cover your coughs and sneezes Cover your mouth and nose with a tissue when you cough or sneeze. Throw away  used tissues in a lined trash can. Immediately wash your hands with soap and water for at least 20 seconds. If soap and water are not available, clean your hands with an alcohol-based hand sanitizer that contains at least 60% alcohol. Clean your hands often Wash your hands often with soap and water for at least 20 seconds. This is especially important after blowing your nose, coughing, or sneezing; going to the bathroom; and before eating or preparing food. Use hand sanitizer if soap and water are not available. Use an alcohol-based hand sanitizer with at least 60% alcohol, covering all surfaces of your hands and rubbing them together until they feel dry. Soap and water are the best option, especially if hands are visibly dirty. Avoid touching your eyes, nose, and mouth with unwashed hands. Handwashing Tips Avoid sharing personal household items Do not share dishes, drinking glasses, cups, eating utensils, towels, or bedding with other people in your home. Wash these items thoroughly after using them with soap and water or put in the dishwasher. Clean surfaces in your home regularly Clean and disinfect high-touch surfaces (for example, doorknobs, tables, handles, light switches, and countertops) in your "sick room" and bathroom. In shared spaces, you should clean and disinfect surfaces and items after each use by the person who is ill. If you are sick and cannot clean, a caregiver or other person should only clean and disinfect the area around you (such as your bedroom and bathroom) on an as needed basis. Your caregiver/other person should wait as long as possible (at least several hours) and wear a mask before entering, cleaning, and disinfecting shared spaces that you use. Clean and disinfect areas that may have blood, stool, or body fluids on them. Use household cleaners and disinfectants. Clean visible dirty surfaces with household cleaners containing soap or detergent. Then, use a household  disinfectant. Use a product from [employer] List N: Disinfectants for Coronavirus ([id number]). Be sure to follow the instructions on the label to ensure safe and effective use of the product. Many products recommend keeping the surface wet with a disinfectant for a certain period of time (look at "contact time" on the product label). You may also need to wear personal protective equipment, such as gloves, depending on the directions on the product label. Immediately after disinfecting, wash your hands with soap and water for 20 seconds. For completed guidance on cleaning and disinfecting your home, visit Complete Disinfection Guidance. Take steps to improve ventilation at home Improve ventilation (air flow) at home to help prevent from spreading COVID-19 to other people in your household. Clear out COVID-19 virus particles in the air by opening windows, using air filters, and turning on fans in  your home. Use this interactive tool to learn how to improve air flow in your home. When you can be around others after being sick with COVID-19 Deciding when you can be around others is different for different situations. Find out when you can safely end home isolation. For any additional questions about your care, contact your healthcare provider or state or local health department. 02/28/2021 Content source: [facility] for Immunization and Respiratory Diseases ([facility]), Division of Viral Diseases This information is not intended to replace advice given to you by your health care provider. Make sure you discuss any questions you have with your health care provider. Document Revised: 04/13/2021 Document Reviewed: 04/13/2021 Elsevier Patient Education  2022 [employer].   If you have been instructed to have an in-person evaluation today at a local Urgent Care facility, please use the link below. It will take you to a list of all of our available [facility], including address, phone number and  hours of operation. Please do not delay care.  [facility]  If you or a family member do not have a primary care provider, use the link below to schedule a visit and establish care. When you choose a [facility] primary care physician or advanced practice provider, you gain a long-term partner in health. Find a Primary Care Provider  Learn more about [facility]'s in-office and virtual care options: [facility]

## 2022-10-18 NOTE — Telephone Encounter (Signed)
Called patient in regard to the Wall completed in my chart for worse symptoms of cough. No answer left vm to call community with any concerns.

## 2022-10-18 NOTE — Progress Notes (Signed)
Virtual Visit Consent   Kelly Tanner, you are scheduled for a virtual visit with a [facility] provider today. Just as with appointments in the office, your consent must be obtained to participate. Your consent will be active for this visit and any virtual visit you may have with one of our providers in the next 365 days. If you have a MyChart account, a copy of this consent can be sent to you electronically.  As this is a virtual visit, video technology does not allow for your provider to perform a traditional examination. This may limit your provider's ability to fully assess your condition. If your provider identifies any concerns that need to be evaluated in person or the need to arrange testing (such as labs, EKG, etc.), we will make arrangements to do so. Although advances in technology are sophisticated, we cannot ensure that it will always work on either your end or our end. If the connection with a video visit is poor, the visit may have to be switched to a telephone visit. With either a video or telephone visit, we are not always able to ensure that we have a secure connection.  By engaging in this virtual visit, you consent to the provision of healthcare and authorize for your insurance to be billed (if applicable) for the services provided during this visit. Depending on your insurance coverage, you may receive a charge related to this service.  I need to obtain your verbal consent now. Are you willing to proceed with your visit today? [name] has provided verbal consent on 10/18/2022 for a virtual visit (video or telephone). Kelly Tanner, [place]  Date: 10/18/2022 8:23 AM  Virtual Visit via Video Note   I, Kelly Tanner, connected with  ILEANE SANDO  ([id number], 1961/12/02) on 10/18/22 at  8:15 AM EST by a video-enabled telemedicine application and verified that I am speaking with the correct person using two identifiers.  Location: Patient: Virtual Visit Location  Patient: Home Provider: Virtual Visit Location Provider: Home Office   I discussed the limitations of evaluation and management by telemedicine and the availability of in person appointments. The patient expressed understanding and agreed to proceed.    History of Present Illness: Kelly Tanner is a 61 y.o. who identifies as a female who was assigned female at birth, and is being seen today for COVID-19. Notes symptoms starting yesterday with some mild cough and congestion. COVID tested and was negative. Overnight into this morning with cough, congestion, chills and low-grade fever. Starting with PND. Took repeat COVID tests x 2 which were positive. Denies chest pain or SOB. Denies GI symptoms. She is out of town at the moment due to her father's death with funeral in 2 days.   HPI: HPI  Problems:  Patient Active Problem List   Diagnosis Date Noted   Joint pain 07/08/2022   Tachycardia 07/08/2022   Cough 02/26/2021   Nasal congestion 12/25/2020   Postmenopausal vaginal bleeding 12/10/2020   RUQ pain 12/20/2019   Leukocytosis 12/20/2019   Right shoulder pain 12/20/2019   Sleep difficulties 09/07/2019   Sore throat 09/09/2017   GERD (gastroesophageal reflux disease) 05/16/2017   Diarrhea 09/18/2015   Health care maintenance 04/17/2015   Elevated blood pressure reading 08/23/2014   Stress 04/06/2014   Anemia 10/11/2013   Hematuria 04/23/2013   Hypercholesterolemia 01/04/2013   Skin cancer 01/04/2013   Family history of colon cancer 01/04/2013   Interstitial cystitis 01/04/2013    Allergies:  Allergies  Allergen Reactions   Sulfa Antibiotics    Medications:  Current Outpatient Medications:    benzonatate (TESSALON) 100 MG capsule, Take 1 capsule (100 mg total) by mouth 3 (three) times daily as needed for cough., Disp: 30 capsule, Rfl: 0   molnupiravir EUA (LAGEVRIO) 200 mg CAPS capsule, Take 4 capsules (800 mg total) by mouth 2 (two) times daily for 5 days., Disp: 40 capsule,  Rfl: 0   ALPRAZolam (XANAX) 0.25 MG tablet, Take one tablet q day prn, Disp: 30 tablet, Rfl: 0   estradiol (ESTRACE) 0.1 MG/GM vaginal cream, As directed., Disp: 42.5 g, Rfl: 1   triamcinolone (KENALOG) 0.1 %, Apply topically., Disp: , Rfl:   Observations/Objective: Patient is well-developed, well-nourished in no acute distress.  Resting comfortably at home.  Head is normocephalic, atraumatic.  No labored breathing. Speech is clear and coherent with logical content.  Patient is alert and oriented at baseline.   Assessment and Plan: 1. COVID-19 - benzonatate (TESSALON) 100 MG capsule; Take 1 capsule (100 mg total) by mouth 3 (three) times daily as needed for cough.  Dispense: 30 capsule; Refill: 0 - molnupiravir EUA (LAGEVRIO) 200 mg CAPS capsule; Take 4 capsules (800 mg total) by mouth 2 (two) times daily for 5 days.  Dispense: 40 capsule; Refill: 0 - MyChart COVID-19 home monitoring program; Future  Patient with multiple risk factors for complicated course of illness. Discussed risks/benefits of antiviral medications including most common potential ADRs. Patient voiced understanding and would like to proceed with antiviral medication. They are candidate for Molnupiravir (had rash with Paxlovid in the past). Rx sent to pharmacy. Supportive measures, OTC medications and vitamin regimen reviewed. Tessalon per orders. Patient has been enrolled in a MyChart COVID symptom monitoring program. Samule Dry reviewed in detail. Strict ER precautions discussed with patient.    Follow Up Instructions: I discussed the assessment and treatment plan with the patient. The patient was provided an opportunity to ask questions and all were answered. The patient agreed with the plan and demonstrated an understanding of the instructions.  A copy of instructions were sent to the patient via MyChart unless otherwise noted below.   The patient was advised to call back or seek an in-person evaluation if the symptoms  worsen or if the condition fails to improve as anticipated.  Time:  I spent 10 minutes with the patient via telehealth technology discussing the above problems/concerns.    Kelly Rio, PA-C

## 2022-10-22 ENCOUNTER — Encounter: Payer: Self-pay | Admitting: Internal Medicine

## 2022-11-08 DIAGNOSIS — L570 Actinic keratosis: Secondary | ICD-10-CM | POA: Diagnosis not present

## 2022-11-08 DIAGNOSIS — D2271 Melanocytic nevi of right lower limb, including hip: Secondary | ICD-10-CM | POA: Diagnosis not present

## 2022-11-08 DIAGNOSIS — D2262 Melanocytic nevi of left upper limb, including shoulder: Secondary | ICD-10-CM | POA: Diagnosis not present

## 2022-11-08 DIAGNOSIS — Z85828 Personal history of other malignant neoplasm of skin: Secondary | ICD-10-CM | POA: Diagnosis not present

## 2022-11-08 DIAGNOSIS — D2261 Melanocytic nevi of right upper limb, including shoulder: Secondary | ICD-10-CM | POA: Diagnosis not present

## 2022-11-08 DIAGNOSIS — X32XXXA Exposure to sunlight, initial encounter: Secondary | ICD-10-CM | POA: Diagnosis not present

## 2023-01-03 ENCOUNTER — Other Ambulatory Visit (INDEPENDENT_AMBULATORY_CARE_PROVIDER_SITE_OTHER): Payer: Federal, State, Local not specified - PPO

## 2023-01-03 DIAGNOSIS — E7800 Pure hypercholesterolemia, unspecified: Secondary | ICD-10-CM

## 2023-01-03 LAB — HEPATIC FUNCTION PANEL
ALT: 16 U/L (ref 0–35)
AST: 15 U/L (ref 0–37)
Albumin: 4.4 g/dL (ref 3.5–5.2)
Alkaline Phosphatase: 107 U/L (ref 39–117)
Bilirubin, Direct: 0.1 mg/dL (ref 0.0–0.3)
Total Bilirubin: 0.4 mg/dL (ref 0.2–1.2)
Total Protein: 7.5 g/dL (ref 6.0–8.3)

## 2023-01-03 LAB — LIPID PANEL
Cholesterol: 178 mg/dL (ref 0–200)
HDL: 36 mg/dL — ABNORMAL LOW (ref >39.00)
LDL Cholesterol: 109 mg/dL — ABNORMAL HIGH (ref 0–99)
NonHDL: 142.49
Total CHOL/HDL Ratio: 5
Triglycerides: 165 mg/dL — ABNORMAL HIGH (ref 0.0–149.0)
VLDL: 33 mg/dL (ref 0.0–40.0)

## 2023-01-03 LAB — BASIC METABOLIC PANEL
BUN: 14 mg/dL (ref 6–23)
CO2: 27 mEq/L (ref 19–32)
Calcium: 9.5 mg/dL (ref 8.4–10.5)
Chloride: 104 mEq/L (ref 96–112)
Creatinine, Ser: 0.73 mg/dL (ref 0.40–1.20)
GFR: 88.43 mL/min (ref >60.00)
Glucose, Bld: 86 mg/dL (ref 70–99)
Potassium: 4 mEq/L (ref 3.5–5.1)
Sodium: 140 mEq/L (ref 135–145)

## 2023-01-08 ENCOUNTER — Encounter: Payer: Self-pay | Admitting: Internal Medicine

## 2023-01-08 ENCOUNTER — Ambulatory Visit: Payer: Federal, State, Local not specified - PPO | Admitting: Internal Medicine

## 2023-01-08 VITALS — BP 138/78 | HR 80 | Temp 98.2°F | Resp 16 | Ht 66.0 in | Wt 155.4 lb

## 2023-01-08 DIAGNOSIS — D649 Anemia, unspecified: Secondary | ICD-10-CM

## 2023-01-08 DIAGNOSIS — E7800 Pure hypercholesterolemia, unspecified: Secondary | ICD-10-CM

## 2023-01-08 DIAGNOSIS — F439 Reaction to severe stress, unspecified: Secondary | ICD-10-CM

## 2023-01-08 DIAGNOSIS — K219 Gastro-esophageal reflux disease without esophagitis: Secondary | ICD-10-CM

## 2023-01-08 DIAGNOSIS — Z800 Family history of malignant neoplasm of digestive organs: Secondary | ICD-10-CM

## 2023-01-08 DIAGNOSIS — Z1211 Encounter for screening for malignant neoplasm of colon: Secondary | ICD-10-CM

## 2023-01-08 MED ORDER — ALPRAZOLAM 0.25 MG PO TABS: ORAL_TABLET | ORAL | 0 refills | Status: DC

## 2023-01-08 NOTE — Patient Instructions (Signed)
Take pepcid 30 minutes before evening meal

## 2023-01-08 NOTE — Progress Notes (Signed)
Subjective:    Patient ID: Kelly Tanner, female    DOB: 1961-03-01, 62 y.o.   MRN: [id number]  Patient here for  Chief Complaint  Patient presents with   Medical Management of Chronic Issues    HPI Here to follow up regarding increased stress and cholesterol.  Father recent passed.  She had covid and was unable to attend the funeral.  Increased stress related.  Discussed.  Has good support.  Has taken xanax prn.  Plans to retire.  Tries to stay active.  No chest pain or sob reported.  Notices some reflux - if bends over.  No increased cough or congestion.  No abdominal pain.  Bowels moving.  Discussed pepcid.     Past Medical History:  Diagnosis Date   Basal cell carcinoma    Hyperlipidemia    Past Surgical History:  Procedure Laterality Date   CYST REMOVAL NECK     REFRACTIVE SURGERY     SKIN CANCER EXCISION  2001   basal cell   Family History  Problem Relation Age of Onset   Lung cancer Other        parent   Colon cancer Other        parent   Social History   Socioeconomic History   Marital status: Married    Spouse name: Not on file   Number of children: 2   Years of education: Not on file   Highest education level: Not on file  Occupational History   Not on file  Tobacco Use   Smoking status: Never   Smokeless tobacco: Never  Substance and Sexual Activity   Alcohol use: Yes    Alcohol/week: 0.0 standard drinks of alcohol   Drug use: No   Sexual activity: Not on file  Other Topics Concern   Not on file  Social History Narrative   Lives at home in [place].    Married with kids   Social Determinants of Health   Financial Resource Strain: Not on file  Food Insecurity: Not on file  Transportation Needs: Not on file  Physical Activity: Not on file  Stress: Not on file  Social Connections: Not on file     Review of Systems  Constitutional:  Negative for appetite change and unexpected weight change.  HENT:  Negative for congestion and sinus  pressure.   Respiratory:  Negative for cough, chest tightness and shortness of breath.   Cardiovascular:  Negative for chest pain, palpitations and leg swelling.  Gastrointestinal:  Negative for abdominal pain, nausea and vomiting.  Genitourinary:  Negative for difficulty urinating and dysuria.  Musculoskeletal:  Negative for joint swelling and myalgias.  Skin:  Negative for color change and rash.  Neurological:  Negative for dizziness and headaches.  Psychiatric/Behavioral:  Negative for agitation.        Increased stress as outlined.         Objective:     BP 138/78   Pulse 80   Temp 98.2 F (36.8 C)   Resp 16   Ht '5\' 6"'$  (1.676 m)   Wt 155 lb 6.4 oz (70.5 kg)   LMP 11/02/2010   SpO2 99%   BMI 25.08 kg/m  Wt Readings from Last 3 Encounters:  01/08/23 155 lb 6.4 oz (70.5 kg)  07/05/22 157 lb 9.6 oz (71.5 kg)  04/05/22 156 lb (70.8 kg)    Physical Exam Vitals reviewed.  Constitutional:      General: She is not in acute  distress.    Appearance: Normal appearance.  HENT:     Head: Normocephalic and atraumatic.     Right Ear: External ear normal.     Left Ear: External ear normal.  Eyes:     General: No scleral icterus.       Right eye: No discharge.        Left eye: No discharge.     Conjunctiva/sclera: Conjunctivae normal.  Neck:     Thyroid: No thyromegaly.  Cardiovascular:     Rate and Rhythm: Normal rate and regular rhythm.  Pulmonary:     Effort: No respiratory distress.     Breath sounds: Normal breath sounds. No wheezing.  Abdominal:     General: Bowel sounds are normal.     Palpations: Abdomen is soft.     Tenderness: There is no abdominal tenderness.  Musculoskeletal:        General: No swelling or tenderness.     Cervical back: Neck supple. No tenderness.  Lymphadenopathy:     Cervical: No cervical adenopathy.  Skin:    Findings: No erythema or rash.  Neurological:     Mental Status: She is alert.  Psychiatric:        Mood and Affect: Mood  normal.        Behavior: Behavior normal.      Outpatient Encounter Medications as of 01/08/2023  Medication Sig   ALPRAZolam (XANAX) 0.25 MG tablet Take one tablet q day prn   estradiol (ESTRACE) 0.1 MG/GM vaginal cream As directed.   triamcinolone (KENALOG) 0.1 % Apply topically.   [DISCONTINUED] ALPRAZolam (XANAX) 0.25 MG tablet Take one tablet q day prn   [DISCONTINUED] benzonatate (TESSALON) 100 MG capsule Take 1 capsule (100 mg total) by mouth 3 (three) times daily as needed for cough.   No facility-administered encounter medications on file as of 01/08/2023.     Lab Results  Component Value Date   WBC 9.5 07/03/2022   HGB 12.6 07/03/2022   HCT 37.8 07/03/2022   PLT 259.0 07/03/2022   GLUCOSE 86 01/03/2023   CHOL 178 01/03/2023   TRIG 165.0 (H) 01/03/2023   HDL 36.00 (L) 01/03/2023   LDLDIRECT 101.0 05/19/2018   LDLCALC 109 (H) 01/03/2023   ALT 16 01/03/2023   AST 15 01/03/2023   NA 140 01/03/2023   K 4.0 01/03/2023   CL 104 01/03/2023   CREATININE 0.73 01/03/2023   BUN 14 01/03/2023   CO2 27 01/03/2023   TSH 1.84 07/03/2022    No results found.     Assessment & Plan:  Hypercholesterolemia Assessment & Plan: The 10-year ASCVD risk score (Arnett DK, et al., 2019) is: 4.4%   Values used to calculate the score:     Age: 28 years     Sex: Female     Is Non-Hispanic African American: No     Diabetic: No     Tobacco smoker: No     Systolic Blood Pressure: 756 mmHg     Is BP treated: No     HDL Cholesterol: 36 mg/dL     Total Cholesterol: 178 mg/dL  Low cholesterol diet and exercise.  Follow lipid panel.   Orders: -     Lipid panel; Future -     Hepatic function panel; Future -     TSH; Future  Anemia, unspecified type Assessment & Plan: Follow cbc.   Orders: -     CBC with Differential/Platelet; Future -     Basic metabolic panel;  Future -     TSH; Future  Stress Assessment & Plan: Increased stress.  Discussed.  Does not feel needs any further  intervention at this time.  Follow.  Does request refill xanax.   Orders: -     ALPRAZolam; Take one tablet q day prn  Dispense: 30 tablet; Refill: 0  Colon cancer screening -     Ambulatory referral to Gastroenterology  Family history of colon cancer Assessment & Plan: Colonoscopy 05/2018.  No polyps.  Recommended f/u in 5 years.     Gastroesophageal reflux disease, unspecified whether esophagitis present Assessment & Plan: With acid reflux as outlined.  Aggravated when bends over.  Start pepcid as directed.  Follow.       Einar Pheasant, MD

## 2023-01-08 NOTE — Assessment & Plan Note (Signed)
The 10-year ASCVD risk score (Arnett DK, et al., 2019) is: 4.4%   Values used to calculate the score:     Age: 62 years     Sex: Female     Is Non-Hispanic African American: No     Diabetic: No     Tobacco smoker: No     Systolic Blood Pressure: 726 mmHg     Is BP treated: No     HDL Cholesterol: 36 mg/dL     Total Cholesterol: 178 mg/dL  Low cholesterol diet and exercise.  Follow lipid panel.

## 2023-01-13 ENCOUNTER — Encounter: Payer: Self-pay | Admitting: Internal Medicine

## 2023-01-13 NOTE — Assessment & Plan Note (Signed)
Follow cbc.  

## 2023-01-13 NOTE — Assessment & Plan Note (Signed)
Colonoscopy 05/2018.  No polyps.  Recommended f/u in 5 years.

## 2023-01-13 NOTE — Assessment & Plan Note (Signed)
Increased stress.  Discussed.  Does not feel needs any further intervention at this time.  Follow.  Does request refill xanax.

## 2023-01-13 NOTE — Assessment & Plan Note (Signed)
With acid reflux as outlined.  Aggravated when bends over.  Start pepcid as directed.  Follow.

## 2023-05-09 DIAGNOSIS — D225 Melanocytic nevi of trunk: Secondary | ICD-10-CM | POA: Diagnosis not present

## 2023-05-09 DIAGNOSIS — D2261 Melanocytic nevi of right upper limb, including shoulder: Secondary | ICD-10-CM | POA: Diagnosis not present

## 2023-05-09 DIAGNOSIS — D2262 Melanocytic nevi of left upper limb, including shoulder: Secondary | ICD-10-CM | POA: Diagnosis not present

## 2023-05-09 DIAGNOSIS — D2271 Melanocytic nevi of right lower limb, including hip: Secondary | ICD-10-CM | POA: Diagnosis not present

## 2023-06-07 DIAGNOSIS — Z800 Family history of malignant neoplasm of digestive organs: Secondary | ICD-10-CM | POA: Diagnosis not present

## 2023-06-07 DIAGNOSIS — K219 Gastro-esophageal reflux disease without esophagitis: Secondary | ICD-10-CM | POA: Diagnosis not present

## 2023-07-11 ENCOUNTER — Other Ambulatory Visit (INDEPENDENT_AMBULATORY_CARE_PROVIDER_SITE_OTHER): Payer: Federal, State, Local not specified - PPO

## 2023-07-11 DIAGNOSIS — D649 Anemia, unspecified: Secondary | ICD-10-CM | POA: Diagnosis not present

## 2023-07-11 DIAGNOSIS — E7800 Pure hypercholesterolemia, unspecified: Secondary | ICD-10-CM

## 2023-07-11 LAB — CBC WITH DIFFERENTIAL/PLATELET
Basophils Absolute: 0.1 10*3/uL (ref 0.0–0.1)
Basophils Relative: 0.8 % (ref 0.0–3.0)
Eosinophils Absolute: 0.1 10*3/uL (ref 0.0–0.7)
Eosinophils Relative: 1 % (ref 0.0–5.0)
HCT: 36.1 % (ref 36.0–46.0)
Hemoglobin: 11.9 g/dL — ABNORMAL LOW (ref 12.0–15.0)
Lymphocytes Relative: 19.5 % (ref 12.0–46.0)
Lymphs Abs: 1.5 10*3/uL (ref 0.7–4.0)
MCHC: 33 g/dL (ref 30.0–36.0)
MCV: 88.9 fl (ref 78.0–100.0)
Monocytes Absolute: 0.5 10*3/uL (ref 0.1–1.0)
Monocytes Relative: 6.7 % (ref 3.0–12.0)
Neutro Abs: 5.6 10*3/uL (ref 1.4–7.7)
Neutrophils Relative %: 72 % (ref 43.0–77.0)
Platelets: 282 10*3/uL (ref 150.0–400.0)
RBC: 4.06 Mil/uL (ref 3.87–5.11)
RDW: 15.5 % (ref 11.5–15.5)
WBC: 7.8 10*3/uL (ref 4.0–10.5)

## 2023-07-11 LAB — HEPATIC FUNCTION PANEL
ALT: 23 U/L (ref 0–35)
AST: 24 U/L (ref 0–37)
Albumin: 4.5 g/dL (ref 3.5–5.2)
Alkaline Phosphatase: 103 U/L (ref 39–117)
Bilirubin, Direct: 0.1 mg/dL (ref 0.0–0.3)
Total Bilirubin: 0.5 mg/dL (ref 0.2–1.2)
Total Protein: 8.2 g/dL (ref 6.0–8.3)

## 2023-07-11 LAB — BASIC METABOLIC PANEL
BUN: 12 mg/dL (ref 6–23)
CO2: 26 mEq/L (ref 19–32)
Calcium: 9.7 mg/dL (ref 8.4–10.5)
Chloride: 102 mEq/L (ref 96–112)
Creatinine, Ser: 0.92 mg/dL (ref 0.40–1.20)
GFR: 66.76 mL/min (ref >60.00)
Glucose, Bld: 91 mg/dL (ref 70–99)
Potassium: 4.3 mEq/L (ref 3.5–5.1)
Sodium: 138 mEq/L (ref 135–145)

## 2023-07-11 LAB — LIPID PANEL
Cholesterol: 175 mg/dL (ref 0–200)
HDL: 36.5 mg/dL — ABNORMAL LOW (ref >39.00)
NonHDL: 138.49
Total CHOL/HDL Ratio: 5
Triglycerides: 259 mg/dL — ABNORMAL HIGH (ref 0.0–149.0)
VLDL: 51.8 mg/dL — ABNORMAL HIGH (ref 0.0–40.0)

## 2023-07-11 LAB — TSH: TSH: 2.47 u[IU]/mL (ref 0.35–5.50)

## 2023-07-11 LAB — LDL CHOLESTEROL, DIRECT: Direct LDL: 85 mg/dL

## 2023-07-12 ENCOUNTER — Ambulatory Visit (INDEPENDENT_AMBULATORY_CARE_PROVIDER_SITE_OTHER): Payer: Federal, State, Local not specified - PPO

## 2023-07-12 ENCOUNTER — Other Ambulatory Visit: Payer: Self-pay | Admitting: Internal Medicine

## 2023-07-12 DIAGNOSIS — D649 Anemia, unspecified: Secondary | ICD-10-CM

## 2023-07-12 LAB — IBC + FERRITIN
Ferritin: 114.5 ng/mL (ref 10.0–291.0)
Iron: 43 ug/dL (ref 42–145)
Saturation Ratios: 12.8 % — ABNORMAL LOW (ref 20.0–50.0)
TIBC: 336 ug/dL (ref 250.0–450.0)
Transferrin: 240 mg/dL (ref 212.0–360.0)

## 2023-07-12 LAB — VITAMIN B12: Vitamin B-12: 192 pg/mL — ABNORMAL LOW (ref 211–911)

## 2023-07-12 NOTE — Progress Notes (Signed)
Order placed for add on labs.   °

## 2023-07-15 ENCOUNTER — Other Ambulatory Visit: Payer: Self-pay | Admitting: Internal Medicine

## 2023-07-15 ENCOUNTER — Other Ambulatory Visit (HOSPITAL_COMMUNITY)
Admission: RE | Admit: 2023-07-15 | Discharge: 2023-07-15 | Disposition: A | Discharge status: Home / Self Care | Payer: Federal, State, Local not specified - PPO | Source: Ambulatory Visit | Attending: Internal Medicine | Admitting: Internal Medicine

## 2023-07-15 ENCOUNTER — Encounter: Payer: Self-pay | Admitting: Internal Medicine

## 2023-07-15 ENCOUNTER — Ambulatory Visit (INDEPENDENT_AMBULATORY_CARE_PROVIDER_SITE_OTHER): Payer: Federal, State, Local not specified - PPO | Admitting: Internal Medicine

## 2023-07-15 VITALS — BP 122/78 | HR 100 | Temp 97.8°F | Ht 66.0 in | Wt 157.8 lb

## 2023-07-15 DIAGNOSIS — Z124 Encounter for screening for malignant neoplasm of cervix: Secondary | ICD-10-CM | POA: Insufficient documentation

## 2023-07-15 DIAGNOSIS — M545 Low back pain, unspecified: Secondary | ICD-10-CM | POA: Diagnosis not present

## 2023-07-15 DIAGNOSIS — M2550 Pain in unspecified joint: Secondary | ICD-10-CM | POA: Insufficient documentation

## 2023-07-15 DIAGNOSIS — Z0000 Encounter for general adult medical examination without abnormal findings: Secondary | ICD-10-CM | POA: Diagnosis not present

## 2023-07-15 DIAGNOSIS — E7800 Pure hypercholesterolemia, unspecified: Secondary | ICD-10-CM | POA: Diagnosis not present

## 2023-07-15 DIAGNOSIS — D649 Anemia, unspecified: Secondary | ICD-10-CM

## 2023-07-15 DIAGNOSIS — E538 Deficiency of other specified B group vitamins: Secondary | ICD-10-CM | POA: Diagnosis not present

## 2023-07-15 DIAGNOSIS — F439 Reaction to severe stress, unspecified: Secondary | ICD-10-CM

## 2023-07-15 MED ORDER — BUSPIRONE HCL 5 MG PO TABS: 5.0000 mg | ORAL_TABLET | Freq: Every day | ORAL | 1 refills | Status: DC

## 2023-07-15 MED ORDER — CYANOCOBALAMIN 1000 MCG/ML IJ SOLN
1000.0000 ug | Freq: Once | INTRAMUSCULAR | Status: AC
Start: 2023-07-15 — End: 2023-07-15
  Administered 2023-07-15: 1000 ug via INTRAMUSCULAR

## 2023-07-15 NOTE — Assessment & Plan Note (Signed)
Physical today 07/15/23.  Colonoscopy 01/2018 - recommended f/u in 5 years.  Mammogram 10/09/22 - [facility]).

## 2023-07-15 NOTE — Progress Notes (Unsigned)
Subjective:    Patient ID: Kelly Tanner, female    DOB: 12-19-1960, 62 y.o.   MRN: [id number]  Patient here for  Chief Complaint  Patient presents with   Annual Exam    HPI Here for a physical exam.    Past Medical History:  Diagnosis Date   Allergy    septra   Basal cell carcinoma    Hyperlipidemia    Past Surgical History:  Procedure Laterality Date   CYST REMOVAL NECK     EYE SURGERY  05/2017   REFRACTIVE SURGERY     SKIN CANCER EXCISION  12/11/1999   basal cell   Family History  Problem Relation Age of Onset   Lung cancer Other        parent   Colon cancer Other        parent   Cancer Mother    Cancer Father    COPD Father    Social History   Socioeconomic History   Marital status: Married    Spouse name: Not on file   Number of children: 2   Years of education: Not on file   Highest education level: Not on file  Occupational History   Not on file  Tobacco Use   Smoking status: Never   Smokeless tobacco: Never  Substance and Sexual Activity   Alcohol use: Yes   Drug use: No   Sexual activity: Yes    Birth control/protection: Post-menopausal  Other Topics Concern   Not on file  Social History Narrative   Lives at home in [place]    Married with kids   Social Determinants of Health   Financial Resource Strain: Not on file  Food Insecurity: Not on file  Transportation Needs: Not on file  Physical Activity: Not on file  Stress: Not on file  Social Connections: Not on file     Review of Systems     Objective:     BP 122/78   Pulse 100   Temp 97.8 F (36.6 C) (Oral)   Ht 5\' 6"  (1.676 m)   Wt 157 lb 12.8 oz (71.6 kg)   LMP 11/02/2010   SpO2 100%   BMI 25.47 kg/m  Wt Readings from Last 3 Encounters:  07/15/23 157 lb 12.8 oz (71.6 kg)  01/08/23 155 lb 6.4 oz (70.5 kg)  07/05/22 157 lb 9.6 oz (71.5 kg)    Physical Exam   Outpatient Encounter Medications as of 07/15/2023  Medication Sig   ALPRAZolam (XANAX) 0.25 MG  tablet Take one tablet q day prn   estradiol (ESTRACE) 0.1 MG/GM vaginal cream As directed.   famotidine (PEPCID) 20 MG tablet Take by mouth as needed.   triamcinolone (KENALOG) 0.1 % Apply topically.   No facility-administered encounter medications on file as of 07/15/2023.     Lab Results  Component Value Date   WBC 7.8 07/11/2023   HGB 11.9 (L) 07/11/2023   HCT 36.1 07/11/2023   PLT 282.0 07/11/2023   GLUCOSE 91 07/11/2023   CHOL 175 07/11/2023   TRIG 259.0 (H) 07/11/2023   HDL 36.50 (L) 07/11/2023   LDLDIRECT 85.0 07/11/2023   LDLCALC 109 (H) 01/03/2023   ALT 23 07/11/2023   AST 24 07/11/2023   NA 138 07/11/2023   K 4.3 07/11/2023   CL 102 07/11/2023   CREATININE 0.92 07/11/2023   BUN 12 07/11/2023   CO2 26 07/11/2023   TSH 2.47 07/11/2023    No results found.  Assessment & Plan:  Health care maintenance Assessment & Plan: Physical today 07/15/23.  Colonoscopy 01/2018 - recommended f/u in 5 years.  Mammogram 10/09/22 - [facility]).     Hypercholesterolemia Assessment & Plan: The 10-year ASCVD risk score (Arnett DK, et al., 2019) is: 4.3%   Values used to calculate the score:     Age: 54 years     Sex: Female     Is Non-Hispanic African American: No     Diabetic: No     Tobacco smoker: No     Systolic Blood Pressure: 122 mmHg     Is BP treated: No     HDL Cholesterol: 36.5 mg/dL     Total Cholesterol: 175 mg/dL    Cervical cancer screening -     Cytology - PAP  Arthralgia, unspecified joint -     Sedimentation rate -     C-reactive protein  Midline low back pain without sciatica, unspecified chronicity     Dale [place], MD

## 2023-07-15 NOTE — Assessment & Plan Note (Signed)
The 10-year ASCVD risk score (Arnett DK, et al., 2019) is: 4.3%   Values used to calculate the score:     Age: 62 years     Sex: Female     Is Non-Hispanic African American: No     Diabetic: No     Tobacco smoker: No     Systolic Blood Pressure: 122 mmHg     Is BP treated: No     HDL Cholesterol: 36.5 mg/dL     Total Cholesterol: 175 mg/dL  Low cholesterol diet and exercise.  Follow  lipid panel.

## 2023-07-16 ENCOUNTER — Encounter: Payer: Self-pay | Admitting: Internal Medicine

## 2023-07-16 DIAGNOSIS — E538 Deficiency of other specified B group vitamins: Secondary | ICD-10-CM | POA: Insufficient documentation

## 2023-07-16 NOTE — Assessment & Plan Note (Signed)
Joint pains as outlined.  Discussed.  Check esr and crp.  Follow.

## 2023-07-16 NOTE — Assessment & Plan Note (Signed)
Low b12 level on recent lab.  Start B12 injections - q week x 4 weeks and then q month.

## 2023-07-16 NOTE — Assessment & Plan Note (Signed)
Hgb 1.9.  recent B12 level low.  Start b12 injections as outlined.

## 2023-07-16 NOTE — Assessment & Plan Note (Signed)
Low back pain - persistent.  Discussed PT evaluation and treatment.  Agreeable.

## 2023-07-16 NOTE — Assessment & Plan Note (Signed)
Increased stress.  Discussed.  Feels needs something to help level things out.  Discussed treatment.  Discussed buspar.  Agreeable.  Start 5mg  q day.  Follow.  Does request refill xanax.

## 2023-07-22 ENCOUNTER — Ambulatory Visit (INDEPENDENT_AMBULATORY_CARE_PROVIDER_SITE_OTHER): Payer: Federal, State, Local not specified - PPO

## 2023-07-22 DIAGNOSIS — E538 Deficiency of other specified B group vitamins: Secondary | ICD-10-CM

## 2023-07-22 MED ORDER — CYANOCOBALAMIN 1000 MCG/ML IJ SOLN
1000.0000 ug | Freq: Once | INTRAMUSCULAR | Status: AC
Start: 2023-07-22 — End: 2023-07-22
  Administered 2023-07-22: 1000 ug via INTRAMUSCULAR

## 2023-07-22 NOTE — Progress Notes (Signed)
Pt presented for their vitamin B12 injection. Pt was identified through two identifiers. Pt tolerated shot well in their right deltoid.  

## 2023-07-25 ENCOUNTER — Encounter (INDEPENDENT_AMBULATORY_CARE_PROVIDER_SITE_OTHER): Payer: Self-pay

## 2023-07-29 ENCOUNTER — Ambulatory Visit (INDEPENDENT_AMBULATORY_CARE_PROVIDER_SITE_OTHER): Payer: Federal, State, Local not specified - PPO

## 2023-07-29 DIAGNOSIS — E538 Deficiency of other specified B group vitamins: Secondary | ICD-10-CM

## 2023-07-29 MED ORDER — CYANOCOBALAMIN 1000 MCG/ML IJ SOLN
1000.0000 ug | Freq: Once | INTRAMUSCULAR | Status: AC
Start: 2023-07-29 — End: 2023-07-29
  Administered 2023-07-29: 1000 ug via INTRAMUSCULAR

## 2023-07-29 NOTE — Progress Notes (Signed)
Pt presented for their vitamin B12 injection. Pt was identified through two identifiers. Pt tolerated shot well in their left  deltoid.  

## 2023-08-01 ENCOUNTER — Ambulatory Visit: Payer: Federal, State, Local not specified - PPO

## 2023-08-01 DIAGNOSIS — K640 First degree hemorrhoids: Secondary | ICD-10-CM | POA: Diagnosis not present

## 2023-08-01 DIAGNOSIS — K633 Ulcer of intestine: Secondary | ICD-10-CM | POA: Diagnosis not present

## 2023-08-01 DIAGNOSIS — Z800 Family history of malignant neoplasm of digestive organs: Secondary | ICD-10-CM | POA: Diagnosis not present

## 2023-08-01 DIAGNOSIS — K5669 Other partial intestinal obstruction: Secondary | ICD-10-CM | POA: Diagnosis not present

## 2023-08-01 DIAGNOSIS — K5289 Other specified noninfective gastroenteritis and colitis: Secondary | ICD-10-CM | POA: Diagnosis not present

## 2023-08-01 DIAGNOSIS — Z1211 Encounter for screening for malignant neoplasm of colon: Secondary | ICD-10-CM | POA: Diagnosis not present

## 2023-08-05 ENCOUNTER — Ambulatory Visit: Payer: Federal, State, Local not specified - PPO | Attending: Internal Medicine

## 2023-08-05 ENCOUNTER — Ambulatory Visit (INDEPENDENT_AMBULATORY_CARE_PROVIDER_SITE_OTHER): Payer: Federal, State, Local not specified - PPO

## 2023-08-05 DIAGNOSIS — M25552 Pain in left hip: Secondary | ICD-10-CM | POA: Diagnosis not present

## 2023-08-05 DIAGNOSIS — M5459 Other low back pain: Secondary | ICD-10-CM | POA: Insufficient documentation

## 2023-08-05 DIAGNOSIS — M545 Low back pain, unspecified: Secondary | ICD-10-CM | POA: Diagnosis not present

## 2023-08-05 DIAGNOSIS — E538 Deficiency of other specified B group vitamins: Secondary | ICD-10-CM

## 2023-08-05 MED ORDER — CYANOCOBALAMIN 1000 MCG/ML IJ SOLN
1000.0000 ug | Freq: Once | INTRAMUSCULAR | Status: AC
Start: 2023-08-05 — End: 2023-08-05
  Administered 2023-08-05: 1000 ug via INTRAMUSCULAR

## 2023-08-05 NOTE — Progress Notes (Signed)
Pt presented for their vitamin B12 injection. Pt was identified through two identifiers. Pt tolerated shot well in their right deltoid.  

## 2023-08-05 NOTE — Therapy (Signed)
OUTPATIENT PHYSICAL THERAPY THORACOLUMBAR EVALUATION   Patient Name: Kelly Tanner MRN: [id number] DOB:1961/05/26, 62 y.o., female Today's Date: 08/05/2023  END OF SESSION:  PT End of Session - 08/05/23 1300     Visit Number 1    Number of Visits 17    Date for PT Re-Evaluation 10/04/23    Authorization - Number of Visits 50    PT Start Time 1301    PT Stop Time 1356    PT Time Calculation (min) 55 min    Activity Tolerance Patient tolerated treatment well    Behavior During Therapy [facility] for tasks assessed/performed             Past Medical History:  Diagnosis Date   Allergy    septra   Basal cell carcinoma    Hyperlipidemia    Past Surgical History:  Procedure Laterality Date   CYST REMOVAL NECK     EYE SURGERY  05/2017   REFRACTIVE SURGERY     SKIN CANCER EXCISION  12/11/1999   basal cell   Patient Active Problem List   Diagnosis Date Noted   B12 deficiency 07/16/2023   Low back pain 07/15/2023   Joint pain 07/08/2022   Tachycardia 07/08/2022   Cough 02/26/2021   Nasal congestion 12/25/2020   Postmenopausal vaginal bleeding 12/10/2020   RUQ pain 12/20/2019   Leukocytosis 12/20/2019   Right shoulder pain 12/20/2019   Sleep difficulties 09/07/2019   Sore throat 09/09/2017   GERD (gastroesophageal reflux disease) 05/16/2017   Diarrhea 09/18/2015   Health care maintenance 04/17/2015   Elevated blood pressure reading 08/23/2014   Stress 04/06/2014   Anemia 10/11/2013   Hematuria 04/23/2013   Hypercholesterolemia 01/04/2013   Skin cancer 01/04/2013   Family history of colon cancer 01/04/2013   Interstitial cystitis 01/04/2013    PCP: Dale [place], MD  REFERRING PROVIDER: Dale [place], MD  REFERRING DIAG: M54.50 (ICD-10-CM) - Midline low back pain without sciatica, unspecified chronicity  Rationale for Evaluation and Treatment: Rehabilitation  THERAPY DIAG:  Other low back pain - Plan: PT plan of care cert/re-cert  Pain in left hip -  Plan: PT plan of care cert/re-cert  ONSET DATE: 07/15/2023  SUBJECTIVE:                                                                                                                                                                                           SUBJECTIVE STATEMENT: See pertinent history  PERTINENT HISTORY:  Midline low back pain. Mainly on L side L lateral hip and L low back. Does not expect to be totally pain free. Uncomfortable during the morning and  the hot shower helps. Just wants to be able to not have to take the advil all the time. Pain started gradually over a year ago. Pt was in beach vacation with grandchildren the end of July in which pt had to carry her grand children, walk up the stairs. Denies loss of bowel and bladder control or saddle anesthesia. Has not yet had imaging for her back.   Aggravating factors: getting in and out of a car, sitting for a while, about an hour, first thing in the morning, putting her pants on (hip flexion with hip ER; hip flexion only does not bother it).   Relieving factors: hot shower, walking does not bother it. Movement. Not much improvement using a heating pad.    No latex allergies   PAIN:  Are you having pain? Yes: NPRS scale: 1/10 Pain location: L low back and posterior hip Pain description: tight ache  PRECAUTIONS: None  RED FLAGS: Bowel or bladder incontinence: No and Cauda equina syndrome: No   WEIGHT BEARING RESTRICTIONS: No  FALLS:  Has patient fallen in last 6 months? 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   NEXT MD VISIT: November 2024 (first week)  OBJECTIVE:     PATIENT SURVEYS:  FOTO Lumbar Spine 56  SCREENING FOR RED FLAGS: Bowel or bladder  incontinence: No Cauda equina syndrome: No   COGNITION: Overall cognitive status: Within functional limits for tasks assessed     SENSATION: WFL   POSTURE: Forward neck, R cervical lateral shift, B protracted shoulders, R lateral shift, movement preference around L1/2 area, R iliac crest slightly higher. L foot pronation  PALPATION: No TTP low back, L posterior and lateral hip, and greater trochanter TTP L piriformis and glute med muscles No TTP with R and L UPA to lumbar transverse processes     LUMBAR ROM:   AROM eval  Flexion WFL, slight L trunk rotation  Extension WFL  Right lateral flexion WFL with R low back pressure  Left lateral flexion WFL with L low back pressure (reproduction of symptoms)  Right rotation WFL  Left rotation WFL   (Blank rows = not tested)  LOWER EXTREMITY ROM:     Passive  Right eval Left eval  Hip flexion    Hip extension    Hip abduction    Hip adduction    Hip internal rotation 18 (with hip joint pain) 26 with possible L posterior hip symptoms.   Hip external rotation [employer] [facility] with reproduction of L lateral dull ache pain  Knee flexion    Knee extension    Ankle dorsiflexion    Ankle plantarflexion    Ankle inversion    Ankle eversion     (Blank rows = not tested)  LOWER EXTREMITY MMT:    MMT Right eval Left eval  Hip flexion 4+ 4  Hip extension 4- 3+  Hip abduction 4 4  Hip adduction    Hip internal rotation    Hip external rotation    Knee flexion 5 4+  Knee extension 5 5  Ankle dorsiflexion    Ankle plantarflexion    Ankle inversion    Ankle eversion     (Blank rows = not tested)  LUMBAR SPECIAL TESTS:  (-) repeated flexion  L Slump did not reproduce L low back pain (-) R piriformis test Possible reproduction of L posterior hip symtpoms with L piriformis test  Long sit test suggests anterior nutation of L innominate.    FUNCTIONAL TESTS:    GAIT: Distance walked: 60 ft Assistive device utilized:  None Level of assistance: Complete Independence Comments: pelvic drop, decreased stance R LE  TODAY'S TREATMENT:                                                                                                                              DATE: 08/05/2023    Therapeutic exercise Seated L piriformis stretch 30 seconds x 3  Improved exercise technique, movement at target joints, use of target muscles after mod verbal, visual, tactile cues.   Pt tolerated session well without aggravation of symptoms.     PATIENT EDUCATION:  Education details: there-ex, HEP, POC Person educated: Patient Education method: Explanation, Demonstration, Tactile cues, Verbal cues, and Handouts Education comprehension: verbalized understanding and returned demonstration  HOME EXERCISE PROGRAM: Access Code: [email or web address] URL: [email or web address] Date: 08/05/2023 Prepared by: Loralyn Freshwater  Exercises - Seated Piriformis Stretch  - 3 x daily - 7 x weekly - 3 sets - 3-5 reps - 30 seconds hold    ASSESSMENT:  CLINICAL IMPRESSION: Patient is a 62 y.o. female who was seen today for physical therapy evaluation and treatment for low back pain. Her symptoms are predominantly on the L low back and posterior hip. She also presents with altered posture, decreased B hip extension and abduction strength, limited hip IR ROM, reproduction of L posterior lateral hip symptoms with supine hip ER at 90/90 position as well as with L lumbar side bending, TTP L piriformis and glute med area, and difficulty performing tasks which involve getting into and out of her car, donning and doffing pants, as well as prolonged sitting secondary to pain. Pt will benefit from skilled physical therapy services to address the aforementioned deficits.      OBJECTIVE IMPAIRMENTS: decreased ROM, decreased strength, improper body mechanics, postural dysfunction, and pain.   ACTIVITY LIMITATIONS: sitting, dressing, and getting into and  out of cars  PARTICIPATION LIMITATIONS:  none  PERSONAL FACTORS: Age, Fitness, Profession, and Time since onset of injury/illness/exacerbation are also affecting patient's functional outcome.   REHAB POTENTIAL: Fair    CLINICAL DECISION MAKING: Stable/uncomplicated  EVALUATION COMPLEXITY: Low   GOALS: Goals reviewed with patient? Yes  SHORT TERM GOALS: Target date: 08/30/2023   Pt will be independent with her initial HEP to decrease pain, improve strength, function, and ability to perform car transfers and don and doff pants more comfortably.  Baseline: Pt has started her initial HEP (08/05/2023) Goal status: INITIAL   LONG TERM GOALS: Target date: 10/04/2023  Pt will improve her lumbar spine FOTO score by at least 10 points as a demonstration of improved function.  Baseline: Lumbar Spine FOTO 56 (08/05/2023) Goal status: INITIAL  2.  Pt will  improve bilateral hip extension and abduction strength by at least 1/2 MMT grade to promote ability to perform car transfers, don and doff pants more comfortably.  Baseline:  MMT Right eval Left eval  Hip flexion 4+ 4  Hip extension 4- 3+  Hip abduction 4 4  Hip adduction    Hip internal rotation    Hip external rotation          Knee flexion 5 4+  Knee extension 5 5  Ankle dorsiflexion    Ankle plantarflexion    Ankle inversion    Ankle eversion     Goal status: INITIAL  3.  Pt will report no low back or posterior hip pain with getting into and out of her car, as well as with donning and doffing her pants or shorts to promote ability to do her ADLs more comfortably.  Baseline: increased low back and L posterior hip pain with aforementioned activities.  Goal status: INITIAL   PLAN:  PT FREQUENCY: 2x/week  PT DURATION: 8 weeks  PLANNED INTERVENTIONS: Therapeutic exercises, Therapeutic activity, Neuromuscular re-education, Patient/Family education, Joint mobilization, Joint manipulation, Aquatic Therapy, Dry Needling,  Electrical stimulation, Spinal manipulation, Spinal mobilization, Traction, Ionotophoresis 4mg /ml Dexamethasone, and Manual therapy.  PLAN FOR NEXT SESSION: Improve L and R hip IR, glute med and max strength, manual techniques, modalities PRN  Loralyn Freshwater PT, DPT  08/05/2023, 2:24 PM

## 2023-08-07 ENCOUNTER — Ambulatory Visit: Payer: Federal, State, Local not specified - PPO

## 2023-08-07 DIAGNOSIS — M25552 Pain in left hip: Secondary | ICD-10-CM | POA: Diagnosis not present

## 2023-08-07 DIAGNOSIS — M5459 Other low back pain: Secondary | ICD-10-CM

## 2023-08-07 DIAGNOSIS — M545 Low back pain, unspecified: Secondary | ICD-10-CM | POA: Diagnosis not present

## 2023-08-07 NOTE — Therapy (Signed)
OUTPATIENT PHYSICAL THERAPY  Treatment   Patient Name: Kelly Tanner MRN: [id number] DOB:07/18/1961, 62 y.o., female Today's Date: 08/07/2023  END OF SESSION:  PT End of Session - 08/07/23 1118     Visit Number 2    Number of Visits 17    Date for PT Re-Evaluation 10/04/23    Authorization - Number of Visits 50    PT Start Time 1118    PT Stop Time 1205    PT Time Calculation (min) 47 min    Activity Tolerance Patient tolerated treatment well    Behavior During Therapy [facility] for tasks assessed/performed              Past Medical History:  Diagnosis Date   Allergy    septra   Basal cell carcinoma    Hyperlipidemia    Past Surgical History:  Procedure Laterality Date   CYST REMOVAL NECK     EYE SURGERY  05/2017   REFRACTIVE SURGERY     SKIN CANCER EXCISION  12/11/1999   basal cell   Patient Active Problem List   Diagnosis Date Noted   B12 deficiency 07/16/2023   Low back pain 07/15/2023   Joint pain 07/08/2022   Tachycardia 07/08/2022   Cough 02/26/2021   Nasal congestion 12/25/2020   Postmenopausal vaginal bleeding 12/10/2020   RUQ pain 12/20/2019   Leukocytosis 12/20/2019   Right shoulder pain 12/20/2019   Sleep difficulties 09/07/2019   Sore throat 09/09/2017   GERD (gastroesophageal reflux disease) 05/16/2017   Diarrhea 09/18/2015   Health care maintenance 04/17/2015   Elevated blood pressure reading 08/23/2014   Stress 04/06/2014   Anemia 10/11/2013   Hematuria 04/23/2013   Hypercholesterolemia 01/04/2013   Skin cancer 01/04/2013   Family history of colon cancer 01/04/2013   Interstitial cystitis 01/04/2013    PCP: Dale [place], MD  REFERRING PROVIDER: Dale Madelia, MD  REFERRING DIAG: M54.50 (ICD-10-CM) - Midline low back pain without sciatica, unspecified chronicity  Rationale for Evaluation and Treatment: Rehabilitation  THERAPY DIAG:  Other low back pain  Pain in left hip  ONSET DATE: 07/15/2023  SUBJECTIVE:                                                                                                                                                                                            SUBJECTIVE STATEMENT: L low back and hip is doing ok. Doing her HEP. No pain currently.     PERTINENT HISTORY:  Midline low back pain. Mainly on L side L lateral hip and L low back. Does not expect to be totally pain free. Uncomfortable during the morning  and the hot shower helps. Just wants to be able to not have to take the advil all the time. Pain started gradually over a year ago. Pt was in beach vacation with grandchildren the end of July in which pt had to carry her grand children, walk up the stairs. Denies loss of bowel and bladder control or saddle anesthesia. Has not yet had imaging for her back.   Aggravating factors: getting in and out of a car, sitting for a while, about an hour, first thing in the morning, putting her pants on (hip flexion with hip ER; hip flexion only does not bother it).   Relieving factors: hot shower, walking does not bother it. Movement. Not much improvement using a heating pad.    No latex allergies   PAIN:  Are you having pain? Yes: NPRS scale: 0/10 Pain location: L low back and posterior hip Pain description: tight ache  PRECAUTIONS: None  RED FLAGS: Bowel or bladder incontinence: No and Cauda equina syndrome: No   WEIGHT BEARING RESTRICTIONS: No  FALLS:  Has patient fallen in last 6 months? 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   NEXT MD VISIT: November 2024 (first week)  OBJECTIVE:     PATIENT SURVEYS:  FOTO Lumbar Spine 56  SCREENING FOR RED FLAGS: Bowel or bladder incontinence: No Cauda equina syndrome:  No   COGNITION: Overall cognitive status: Within functional limits for tasks assessed     SENSATION: WFL   POSTURE: Forward neck, R cervical lateral shift, B protracted shoulders, R lateral shift, movement preference around L1/2 area, R iliac crest slightly higher. L foot pronation  PALPATION: No TTP low back, L posterior and lateral hip, and greater trochanter TTP L piriformis and glute med muscles No TTP with R and L UPA to lumbar transverse processes     LUMBAR ROM:   AROM eval  Flexion WFL, slight L trunk rotation  Extension WFL  Right lateral flexion WFL with R low back pressure  Left lateral flexion WFL with L low back pressure (reproduction of symptoms)  Right rotation WFL  Left rotation WFL   (Blank rows = not tested)  LOWER EXTREMITY ROM:     Passive  Right eval Left eval  Hip flexion    Hip extension    Hip abduction    Hip adduction    Hip internal rotation 18 (with hip joint pain) 26 with possible L posterior hip symptoms.   Hip external rotation [facility] with reproduction of L lateral dull ache pain  Knee flexion    Knee extension    Ankle dorsiflexion    Ankle plantarflexion    Ankle inversion    Ankle eversion     (Blank rows = not tested)  LOWER EXTREMITY MMT:    MMT Right eval Left eval  Hip flexion 4+ 4  Hip extension 4- 3+  Hip abduction 4 4  Hip adduction    Hip internal rotation    Hip external rotation    Knee flexion 5 4+  Knee extension 5 5  Ankle dorsiflexion    Ankle plantarflexion    Ankle inversion    Ankle eversion     (Blank rows = not tested)  LUMBAR SPECIAL TESTS:  (-) repeated  flexion L Slump did not reproduce L low back pain (-) R piriformis test Possible reproduction of L posterior hip symtpoms with L piriformis test  Long sit test suggests anterior nutation of L innominate.    FUNCTIONAL TESTS:    GAIT: Distance walked: 60 ft Assistive device utilized: None Level of assistance: Complete  Independence Comments: pelvic drop, decreased stance R LE  TODAY'S TREATMENT:                                                                                                                              DATE: 08/07/2023    Therapeutic exercise Supine hip IR stretch with PT   L 1 min  R 1 minute x 3  Supine with hip at 90/90 position  Hip PROM IR   L 10x3   R 10x3  Hooklying lower trunk rotation 10x  Hookying posterior pelvic tilt 10x5 seconds for 2 sets  Hooklying reverse crunch 10x3  Hooklying crunch 10x3  Pallof press  Resistance on L     Green band 10x3 with 5 second holds  Resistance on R   Green band10x3 with 5 second holds  Single leg dead lift  10x each LE  Ergonomic car transfers into and out of driver's seat 2x  No pain    Improved exercise technique, movement at target joints, use of target muscles after mod verbal, visual, tactile cues.   Pt tolerated session well without aggravation of symptoms.     PATIENT EDUCATION:  Education details: there-ex, HEP, POC Person educated: Patient Education method: Explanation, Demonstration, Tactile cues, Verbal cues, and Handouts Education comprehension: verbalized understanding and returned demonstration  HOME EXERCISE PROGRAM: Access Code: [email or web address] URL: [email or web address] Date: 08/05/2023 Prepared by: Loralyn Freshwater  Exercises - Seated Piriformis Stretch  - 3 x daily - 7 x weekly - 3 sets - 3-5 reps - 30 seconds hold  - Bilateral Bent Leg Lift  - 1 x daily - 7 x weekly - 3 sets - 10 reps     ASSESSMENT:  CLINICAL IMPRESSION: Worked on improving core and glute strength to decrease stress to low back when performing car transfers, donning and doffing pants, as well as taking care of her grand children. Pt tolerated session well without aggravation of symptoms. No low back pain with car transfers when performed ergonomically. Pt will benefit from continued skilled physical therapy services  to improve strength, function, and decrease pain.      OBJECTIVE IMPAIRMENTS: decreased ROM, decreased strength, improper body mechanics, postural dysfunction, and pain.   ACTIVITY LIMITATIONS: sitting, dressing, and getting into and out of cars  PARTICIPATION LIMITATIONS:  none  PERSONAL FACTORS: Age, Fitness, Profession, and Time since onset of injury/illness/exacerbation are also affecting patient's functional outcome.   REHAB POTENTIAL: Fair    CLINICAL DECISION MAKING: Stable/uncomplicated  EVALUATION COMPLEXITY: Low   GOALS: Goals reviewed with patient? Yes  SHORT TERM GOALS: Target date: 08/30/2023   Pt will be  independent with her initial HEP to decrease pain, improve strength, function, and ability to perform car transfers and don and doff pants more comfortably.  Baseline: Pt has started her initial HEP (08/05/2023) Goal status: INITIAL   LONG TERM GOALS: Target date: 10/04/2023  Pt will improve her lumbar spine FOTO score by at least 10 points as a demonstration of improved function.  Baseline: Lumbar Spine FOTO 56 (08/05/2023) Goal status: INITIAL  2.  Pt will improve bilateral hip extension and abduction strength by at least 1/2 MMT grade to promote ability to perform car transfers, don and doff pants more comfortably.  Baseline:  MMT Right eval Left eval  Hip flexion 4+ 4  Hip extension 4- 3+  Hip abduction 4 4  Hip adduction    Hip internal rotation    Hip external rotation          Knee flexion 5 4+  Knee extension 5 5  Ankle dorsiflexion    Ankle plantarflexion    Ankle inversion    Ankle eversion     Goal status: INITIAL  3.  Pt will report no low back or posterior hip pain with getting into and out of her car, as well as with donning and doffing her pants or shorts to promote ability to do her ADLs more comfortably.  Baseline: increased low back and L posterior hip pain with aforementioned activities.  Goal status: INITIAL   PLAN:  PT  FREQUENCY: 2x/week  PT DURATION: 8 weeks  PLANNED INTERVENTIONS: Therapeutic exercises, Therapeutic activity, Neuromuscular re-education, Patient/Family education, Joint mobilization, Joint manipulation, Aquatic Therapy, Dry Needling, Electrical stimulation, Spinal manipulation, Spinal mobilization, Traction, Ionotophoresis 4mg /ml Dexamethasone, and Manual therapy.  PLAN FOR NEXT SESSION: Improve L and R hip IR, glute med and max strength, manual techniques, modalities PRN  Loralyn Freshwater PT, DPT  08/07/2023, 12:18 PM

## 2023-08-13 ENCOUNTER — Ambulatory Visit: Payer: Federal, State, Local not specified - PPO | Attending: Internal Medicine

## 2023-08-13 DIAGNOSIS — M25552 Pain in left hip: Secondary | ICD-10-CM | POA: Insufficient documentation

## 2023-08-13 DIAGNOSIS — M5459 Other low back pain: Secondary | ICD-10-CM | POA: Diagnosis not present

## 2023-08-13 NOTE — Therapy (Signed)
OUTPATIENT PHYSICAL THERAPY  Treatment   Patient Name: Kelly Tanner MRN: [id number] DOB:15-Oct-1961, 62 y.o., female Today's Date: 08/13/2023  END OF SESSION:  PT End of Session - 08/13/23 1033     Visit Number 3    Number of Visits 17    Date for PT Re-Evaluation 10/04/23    Authorization - Number of Visits 50    PT Start Time 1033    PT Stop Time 1114    PT Time Calculation (min) 41 min    Activity Tolerance Patient tolerated treatment well    Behavior During Therapy [facility] for tasks assessed/performed               Past Medical History:  Diagnosis Date   Allergy    septra   Basal cell carcinoma    Hyperlipidemia    Past Surgical History:  Procedure Laterality Date   CYST REMOVAL NECK     EYE SURGERY  05/2017   REFRACTIVE SURGERY     SKIN CANCER EXCISION  12/11/1999   basal cell   Patient Active Problem List   Diagnosis Date Noted   B12 deficiency 07/16/2023   Low back pain 07/15/2023   Joint pain 07/08/2022   Tachycardia 07/08/2022   Cough 02/26/2021   Nasal congestion 12/25/2020   Postmenopausal vaginal bleeding 12/10/2020   RUQ pain 12/20/2019   Leukocytosis 12/20/2019   Right shoulder pain 12/20/2019   Sleep difficulties 09/07/2019   Sore throat 09/09/2017   GERD (gastroesophageal reflux disease) 05/16/2017   Diarrhea 09/18/2015   Health care maintenance 04/17/2015   Elevated blood pressure reading 08/23/2014   Stress 04/06/2014   Anemia 10/11/2013   Hematuria 04/23/2013   Hypercholesterolemia 01/04/2013   Skin cancer 01/04/2013   Family history of colon cancer 01/04/2013   Interstitial cystitis 01/04/2013    PCP: Dale [place], MD  REFERRING PROVIDER: Dale [place], MD  REFERRING DIAG: M54.50 (ICD-10-CM) - Midline low back pain without sciatica, unspecified chronicity  Rationale for Evaluation and Treatment: Rehabilitation  THERAPY DIAG:  Other low back pain  Pain in left hip  ONSET DATE: 07/15/2023  SUBJECTIVE:                                                                                                                                                                                            SUBJECTIVE STATEMENT: L low back is doing a little better actually. No pain currently. Concentrating on the proper way to get into and out of the car.   PERTINENT HISTORY:  Midline low back pain. Mainly on L side L lateral hip and L low back. Does not  expect to be totally pain free. Uncomfortable during the morning and the hot shower helps. Just wants to be able to not have to take the advil all the time. Pain started gradually over a year ago. Pt was in beach vacation with grandchildren the end of July in which pt had to carry her grand children, walk up the stairs. Denies loss of bowel and bladder control or saddle anesthesia. Has not yet had imaging for her back.   Aggravating factors: getting in and out of a car, sitting for a while, about an hour, first thing in the morning, putting her pants on (hip flexion with hip ER; hip flexion only does not bother it).   Relieving factors: hot shower, walking does not bother it. Movement. Not much improvement using a heating pad.    No latex allergies   PAIN:  Are you having pain? Yes: NPRS scale: 0/10 Pain location: L low back and posterior hip Pain description: tight ache  PRECAUTIONS: None  RED FLAGS: Bowel or bladder incontinence: No and Cauda equina syndrome: No   WEIGHT BEARING RESTRICTIONS: No  FALLS:  Has patient fallen in last 6 months? 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   NEXT MD VISIT: November 2024 (first week)  OBJECTIVE:     PATIENT SURVEYS:  FOTO Lumbar Spine 56  SCREENING FOR RED  FLAGS: Bowel or bladder incontinence: No Cauda equina syndrome: No   COGNITION: Overall cognitive status: Within functional limits for tasks assessed     SENSATION: WFL   POSTURE: Forward neck, R cervical lateral shift, B protracted shoulders, R lateral shift, movement preference around L1/2 area, R iliac crest slightly higher. L foot pronation  PALPATION: No TTP low back, L posterior and lateral hip, and greater trochanter TTP L piriformis and glute med muscles No TTP with R and L UPA to lumbar transverse processes     LUMBAR ROM:   AROM eval  Flexion WFL, slight L trunk rotation  Extension WFL  Right lateral flexion WFL with R low back pressure  Left lateral flexion WFL with L low back pressure (reproduction of symptoms)  Right rotation WFL  Left rotation WFL   (Blank rows = not tested)  LOWER EXTREMITY ROM:     Passive  Right eval Left eval  Hip flexion    Hip extension    Hip abduction    Hip adduction    Hip internal rotation 18 (with hip joint pain) 26 with possible L posterior hip symptoms.   Hip external rotation [facility] with reproduction of L lateral dull ache pain  Knee flexion    Knee extension    Ankle dorsiflexion    Ankle plantarflexion    Ankle inversion    Ankle eversion     (Blank rows = not tested)  LOWER EXTREMITY MMT:    MMT Right eval Left eval  Hip flexion 4+ 4  Hip extension 4- 3+  Hip abduction 4 4  Hip adduction    Hip internal rotation    Hip external rotation    Knee flexion 5 4+  Knee extension 5 5  Ankle dorsiflexion    Ankle plantarflexion    Ankle inversion    Ankle eversion     (Blank rows =  not tested)  LUMBAR SPECIAL TESTS:  (-) repeated flexion L Slump did not reproduce L low back pain (-) R piriformis test Possible reproduction of L posterior hip symtpoms with L piriformis test  Long sit test suggests anterior nutation of L innominate.    FUNCTIONAL TESTS:    GAIT: Distance walked: 60  ft Assistive device utilized: None Level of assistance: Complete Independence Comments: pelvic drop, decreased stance R LE  TODAY'S TREATMENT:                                                                                                                              DATE: 08/13/2023    Therapeutic exercise Supine hip IR stretch with PT   L 1 min  R 1 minute x 3  Supine with hip at 90/90 position  Hip PROM IR   L 10x3   R 10x3  Hooklying lower trunk rotation 10x  Hookying posterior pelvic tilt 10x5 seconds for 2 sets  Bridge 10x3  Hooklying reverse crunch 10x3  Prone glute max extension   R 5x3  L 5x3  R S/L reverse clam shell  L 10x, then 10x5 seconds     Improved exercise technique, movement at target joints, use of target muscles after mod verbal, visual, tactile cues.   Pt tolerated session well without aggravation of symptoms.          PATIENT EDUCATION:  Education details: there-ex, HEP, POC Person educated: Patient Education method: Explanation, Demonstration, Tactile cues, Verbal cues, and Handouts Education comprehension: verbalized understanding and returned demonstration  HOME EXERCISE PROGRAM: Access Code: [email or web address] URL: [email or web address] Date: 08/05/2023 Prepared by: Loralyn Freshwater  Exercises - Seated Piriformis Stretch  - 3 x daily - 7 x weekly - 3 sets - 3-5 reps - 30 seconds hold  - Bilateral Bent Leg Lift  - 1 x daily - 7 x weekly - 3 sets - 10 reps     ASSESSMENT:  CLINICAL IMPRESSION:  Improved low back and L hip pain based on subjective reports. Continued working on improving core and glute strength to decrease stress to low back when performing car transfers, donning and doffing pants, as well as taking care of her grand children. Pt tolerated session well without aggravation of symptoms. Pt will benefit from continued skilled physical therapy services to improve strength, function, and decrease pain.       OBJECTIVE IMPAIRMENTS: decreased ROM, decreased strength, improper body mechanics, postural dysfunction, and pain.   ACTIVITY LIMITATIONS: sitting, dressing, and getting into and out of cars  PARTICIPATION LIMITATIONS:  none  PERSONAL FACTORS: Age, Fitness, Profession, and Time since onset of injury/illness/exacerbation are also affecting patient's functional outcome.   REHAB POTENTIAL: Fair    CLINICAL DECISION MAKING: Stable/uncomplicated  EVALUATION COMPLEXITY: Low   GOALS: Goals reviewed with patient? Yes  SHORT TERM GOALS: Target date: 08/30/2023   Pt will be independent with her initial HEP to decrease pain, improve strength, function, and  ability to perform car transfers and don and doff pants more comfortably.  Baseline: Pt has started her initial HEP (08/05/2023) Goal status: INITIAL   LONG TERM GOALS: Target date: 10/04/2023  Pt will improve her lumbar spine FOTO score by at least 10 points as a demonstration of improved function.  Baseline: Lumbar Spine FOTO 56 (08/05/2023) Goal status: INITIAL  2.  Pt will improve bilateral hip extension and abduction strength by at least 1/2 MMT grade to promote ability to perform car transfers, don and doff pants more comfortably.  Baseline:  MMT Right eval Left eval  Hip flexion 4+ 4  Hip extension 4- 3+  Hip abduction 4 4  Hip adduction    Hip internal rotation    Hip external rotation          Knee flexion 5 4+  Knee extension 5 5  Ankle dorsiflexion    Ankle plantarflexion    Ankle inversion    Ankle eversion     Goal status: INITIAL  3.  Pt will report no low back or posterior hip pain with getting into and out of her car, as well as with donning and doffing her pants or shorts to promote ability to do her ADLs more comfortably.  Baseline: increased low back and L posterior hip pain with aforementioned activities.  Goal status: INITIAL   PLAN:  PT FREQUENCY: 2x/week  PT DURATION: 8 weeks  PLANNED  INTERVENTIONS: Therapeutic exercises, Therapeutic activity, Neuromuscular re-education, Patient/Family education, Joint mobilization, Joint manipulation, Aquatic Therapy, Dry Needling, Electrical stimulation, Spinal manipulation, Spinal mobilization, Traction, Ionotophoresis 4mg /ml Dexamethasone, and Manual therapy.  PLAN FOR NEXT SESSION: Improve L and R hip IR, glute med and max strength, manual techniques, modalities PRN  Loralyn Freshwater PT, DPT  08/13/2023, 12:28 PM

## 2023-08-15 ENCOUNTER — Ambulatory Visit: Payer: Federal, State, Local not specified - PPO

## 2023-08-15 DIAGNOSIS — M5459 Other low back pain: Secondary | ICD-10-CM | POA: Diagnosis not present

## 2023-08-15 DIAGNOSIS — M25552 Pain in left hip: Secondary | ICD-10-CM | POA: Diagnosis not present

## 2023-08-15 NOTE — Therapy (Signed)
OUTPATIENT PHYSICAL THERAPY  Treatment   Patient Name: Kelly Tanner MRN: [id number] DOB:September 08, 1961, 62 y.o., female Today's Date: 08/15/2023  END OF SESSION:  PT End of Session - 08/15/23 1026     Visit Number 4    Number of Visits 17    Date for PT Re-Evaluation 10/04/23    Authorization - Number of Visits 50    PT Start Time 1028    PT Stop Time 1108    PT Time Calculation (min) 40 min    Activity Tolerance Patient tolerated treatment well    Behavior During Therapy [facility] for tasks assessed/performed                Past Medical History:  Diagnosis Date   Allergy    septra   Basal cell carcinoma    Hyperlipidemia    Past Surgical History:  Procedure Laterality Date   CYST REMOVAL NECK     EYE SURGERY  05/2017   REFRACTIVE SURGERY     SKIN CANCER EXCISION  12/11/1999   basal cell   Patient Active Problem List   Diagnosis Date Noted   B12 deficiency 07/16/2023   Low back pain 07/15/2023   Joint pain 07/08/2022   Tachycardia 07/08/2022   Cough 02/26/2021   Nasal congestion 12/25/2020   Postmenopausal vaginal bleeding 12/10/2020   RUQ pain 12/20/2019   Leukocytosis 12/20/2019   Right shoulder pain 12/20/2019   Sleep difficulties 09/07/2019   Sore throat 09/09/2017   GERD (gastroesophageal reflux disease) 05/16/2017   Diarrhea 09/18/2015   Health care maintenance 04/17/2015   Elevated blood pressure reading 08/23/2014   Stress 04/06/2014   Anemia 10/11/2013   Hematuria 04/23/2013   Hypercholesterolemia 01/04/2013   Skin cancer 01/04/2013   Family history of colon cancer 01/04/2013   Interstitial cystitis 01/04/2013    PCP: Dale [place], MD  REFERRING PROVIDER: Dale [place], MD  REFERRING DIAG: M54.50 (ICD-10-CM) - Midline low back pain without sciatica, unspecified chronicity  Rationale for Evaluation and Treatment: Rehabilitation  THERAPY DIAG:  Other low back pain  Pain in left hip  ONSET DATE: 07/15/2023  SUBJECTIVE:                                                                                                                                                                                            SUBJECTIVE STATEMENT: seeing some improvement. Squatting down and getting back up, putting pants on and getting into and out of the car are getting better. Getting up after sitting for a while is better.    PERTINENT HISTORY:  Midline low back pain. Mainly on L side  L lateral hip and L low back. Does not expect to be totally pain free. Uncomfortable during the morning and the hot shower helps. Just wants to be able to not have to take the advil all the time. Pain started gradually over a year ago. Pt was in beach vacation with grandchildren the end of July in which pt had to carry her grand children, walk up the stairs. Denies loss of bowel and bladder control or saddle anesthesia. Has not yet had imaging for her back.   Aggravating factors: getting in and out of a car, sitting for a while, about an hour, first thing in the morning, putting her pants on (hip flexion with hip ER; hip flexion only does not bother it).   Relieving factors: hot shower, walking does not bother it. Movement. Not much improvement using a heating pad.    No latex allergies   PAIN:  Are you having pain? Yes: NPRS scale: 0/10 Pain location: L low back and posterior hip Pain description: tight ache  PRECAUTIONS: None  RED FLAGS: Bowel or bladder incontinence: No and Cauda equina syndrome: No   WEIGHT BEARING RESTRICTIONS: No  FALLS:  Has patient fallen in last 6 months? 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   NEXT MD VISIT: November 2024 (first week)  OBJECTIVE:     PATIENT  SURVEYS:  FOTO Lumbar Spine 56  SCREENING FOR RED FLAGS: Bowel or bladder incontinence: No Cauda equina syndrome: No   COGNITION: Overall cognitive status: Within functional limits for tasks assessed     SENSATION: WFL   POSTURE: Forward neck, R cervical lateral shift, B protracted shoulders, R lateral shift, movement preference around L1/2 area, R iliac crest slightly higher. L foot pronation  PALPATION: No TTP low back, L posterior and lateral hip, and greater trochanter TTP L piriformis and glute med muscles No TTP with R and L UPA to lumbar transverse processes     LUMBAR ROM:   AROM eval  Flexion WFL, slight L trunk rotation  Extension WFL  Right lateral flexion WFL with R low back pressure  Left lateral flexion WFL with L low back pressure (reproduction of symptoms)  Right rotation WFL  Left rotation WFL   (Blank rows = not tested)  LOWER EXTREMITY ROM:     Passive  Right eval Left eval  Hip flexion    Hip extension    Hip abduction    Hip adduction    Hip internal rotation 18 (with hip joint pain) 26 with possible L posterior hip symptoms.   Hip external rotation [facility] with reproduction of L lateral dull ache pain  Knee flexion    Knee extension    Ankle dorsiflexion    Ankle plantarflexion    Ankle inversion    Ankle eversion     (Blank rows = not tested)  LOWER EXTREMITY MMT:    MMT Right eval Left eval  Hip flexion 4+ 4  Hip extension 4- 3+  Hip abduction 4 4  Hip adduction    Hip internal rotation    Hip external rotation    Knee flexion 5 4+  Knee extension 5 5  Ankle dorsiflexion    Ankle plantarflexion    Ankle inversion  Ankle eversion     (Blank rows = not tested)  LUMBAR SPECIAL TESTS:  (-) repeated flexion L Slump did not reproduce L low back pain (-) R piriformis test Possible reproduction of L posterior hip symtpoms with L piriformis test  Long sit test suggests anterior nutation of L innominate.    FUNCTIONAL  TESTS:    GAIT: Distance walked: 60 ft Assistive device utilized: None Level of assistance: Complete Independence Comments: pelvic drop, decreased stance R LE  TODAY'S TREATMENT:                                                                                                                              DATE: 08/15/2023    Therapeutic exercise   Bridge 10x3 with 5 second holds  S/L reverse clam shell  L 10x5 seconds for 3 sets  R 10x5 seconds for 3 sets  Seated hip ER yellow band   L 10x2  R 10x2  Seated hip adduction isometrics folded pillow (2x) squeeze 10x5 seconds for 3 sets   Single leg dead lift with contralateral UE assist  L 10x3  R 10x3  Standing B shoulder extension with scapular retraction red band to promote thoracic extension and decrease stress to low back 10x2  Cues to decrease R > L shoulder shrug compensation.       Improved exercise technique, movement at target joints, use of target muscles after mod verbal, visual, tactile cues.   Pt tolerated session well without aggravation of symptoms.          PATIENT EDUCATION:  Education details: there-ex, HEP, POC Person educated: Patient Education method: Explanation, Demonstration, Tactile cues, Verbal cues, and Handouts Education comprehension: verbalized understanding and returned demonstration  HOME EXERCISE PROGRAM: Access Code: [email or web address] URL: [email or web address] Date: 08/05/2023 Prepared by: Loralyn Freshwater  Exercises - Seated Piriformis Stretch  - 3 x daily - 7 x weekly - 3 sets - 3-5 reps - 30 seconds hold  - Bilateral Bent Leg Lift  - 1 x daily - 7 x weekly - 3 sets - 10 reps  - Supine Bridge  - 1 x daily - 7 x weekly - 3 sets - 10 reps - 5 seconds hold  - Sidelying Reverse Clamshell  - 1 x daily - 7 x weekly - 3 sets - 10 reps - 5 seconds hold    ASSESSMENT:  CLINICAL IMPRESSION:  Improving  low back and L hip pain based on subjective reports. Continued  working on improving hip IR ROM, and strength, decreasing over activation of piriformis muscle, and improving glute max strength. Also worked on thoracic extension to decrease stress to low back. Pt tolerated session well without aggravation of symptoms. Pt will benefit from continued skilled physical therapy services to improve strength, function, and decrease pain.      OBJECTIVE IMPAIRMENTS: decreased ROM, decreased strength, improper body mechanics, postural dysfunction, and pain.   ACTIVITY LIMITATIONS: sitting, dressing, and getting into  and out of cars  PARTICIPATION LIMITATIONS:  none  PERSONAL FACTORS: Age, Fitness, Profession, and Time since onset of injury/illness/exacerbation are also affecting patient's functional outcome.   REHAB POTENTIAL: Fair    CLINICAL DECISION MAKING: Stable/uncomplicated  EVALUATION COMPLEXITY: Low   GOALS: Goals reviewed with patient? Yes  SHORT TERM GOALS: Target date: 08/30/2023   Pt will be independent with her initial HEP to decrease pain, improve strength, function, and ability to perform car transfers and don and doff pants more comfortably.  Baseline: Pt has started her initial HEP (08/05/2023) Goal status: INITIAL   LONG TERM GOALS: Target date: 10/04/2023  Pt will improve her lumbar spine FOTO score by at least 10 points as a demonstration of improved function.  Baseline: Lumbar Spine FOTO 56 (08/05/2023) Goal status: INITIAL  2.  Pt will improve bilateral hip extension and abduction strength by at least 1/2 MMT grade to promote ability to perform car transfers, don and doff pants more comfortably.  Baseline:  MMT Right eval Left eval  Hip flexion 4+ 4  Hip extension 4- 3+  Hip abduction 4 4  Hip adduction    Hip internal rotation    Hip external rotation          Knee flexion 5 4+  Knee extension 5 5  Ankle dorsiflexion    Ankle plantarflexion    Ankle inversion    Ankle eversion     Goal status: INITIAL  3.  Pt  will report no low back or posterior hip pain with getting into and out of her car, as well as with donning and doffing her pants or shorts to promote ability to do her ADLs more comfortably.  Baseline: increased low back and L posterior hip pain with aforementioned activities.  Goal status: INITIAL   PLAN:  PT FREQUENCY: 2x/week  PT DURATION: 8 weeks  PLANNED INTERVENTIONS: Therapeutic exercises, Therapeutic activity, Neuromuscular re-education, Patient/Family education, Joint mobilization, Joint manipulation, Aquatic Therapy, Dry Needling, Electrical stimulation, Spinal manipulation, Spinal mobilization, Traction, Ionotophoresis 4mg /ml Dexamethasone, and Manual therapy.  PLAN FOR NEXT SESSION: Improve L and R hip IR, glute med and max strength, manual techniques, modalities PRN  Loralyn Freshwater PT, DPT  08/15/2023, 12:10 PM

## 2023-08-20 ENCOUNTER — Ambulatory Visit: Payer: Federal, State, Local not specified - PPO

## 2023-08-20 DIAGNOSIS — M5459 Other low back pain: Secondary | ICD-10-CM | POA: Diagnosis not present

## 2023-08-20 DIAGNOSIS — M25552 Pain in left hip: Secondary | ICD-10-CM

## 2023-08-20 NOTE — Therapy (Signed)
OUTPATIENT PHYSICAL THERAPY  Treatment   Patient Name: Kelly Tanner MRN: [id number] DOB:09-24-1961, 62 y.o., female Today's Date: 08/20/2023  END OF SESSION:  PT End of Session - 08/20/23 1301     Visit Number 5    Number of Visits 17    Date for PT Re-Evaluation 10/04/23    Authorization - Number of Visits 50    PT Start Time 1301    PT Stop Time 1343    PT Time Calculation (min) 42 min    Activity Tolerance Patient tolerated treatment well    Behavior During Therapy [facility] for tasks assessed/performed                 Past Medical History:  Diagnosis Date   Allergy    septra   Basal cell carcinoma    Hyperlipidemia    Past Surgical History:  Procedure Laterality Date   CYST REMOVAL NECK     EYE SURGERY  05/2017   REFRACTIVE SURGERY     SKIN CANCER EXCISION  12/11/1999   basal cell   Patient Active Problem List   Diagnosis Date Noted   B12 deficiency 07/16/2023   Low back pain 07/15/2023   Joint pain 07/08/2022   Tachycardia 07/08/2022   Cough 02/26/2021   Nasal congestion 12/25/2020   Postmenopausal vaginal bleeding 12/10/2020   RUQ pain 12/20/2019   Leukocytosis 12/20/2019   Right shoulder pain 12/20/2019   Sleep difficulties 09/07/2019   Sore throat 09/09/2017   GERD (gastroesophageal reflux disease) 05/16/2017   Diarrhea 09/18/2015   Health care maintenance 04/17/2015   Elevated blood pressure reading 08/23/2014   Stress 04/06/2014   Anemia 10/11/2013   Hematuria 04/23/2013   Hypercholesterolemia 01/04/2013   Skin cancer 01/04/2013   Family history of colon cancer 01/04/2013   Interstitial cystitis 01/04/2013    PCP: Dale [place], MD  REFERRING PROVIDER: Dale [place], MD  REFERRING DIAG: M54.50 (ICD-10-CM) - Midline low back pain without sciatica, unspecified chronicity  Rationale for Evaluation and Treatment: Rehabilitation  THERAPY DIAG:  Other low back pain  Pain in left hip  ONSET DATE: 07/15/2023  SUBJECTIVE:                                                                                                                                                                                            SUBJECTIVE STATEMENT: Low back/L hip is doing pretty good, seeing improvement. No pain currently. Still has a little difficulty getting up from the floor with grand children.   PERTINENT HISTORY:  Midline low back pain. Mainly on L side L lateral hip and L low back.  Does not expect to be totally pain free. Uncomfortable during the morning and the hot shower helps. Just wants to be able to not have to take the advil all the time. Pain started gradually over a year ago. Pt was in beach vacation with grandchildren the end of July in which pt had to carry her grand children, walk up the stairs. Denies loss of bowel and bladder control or saddle anesthesia. Has not yet had imaging for her back.   Aggravating factors: getting in and out of a car, sitting for a while, about an hour, first thing in the morning, putting her pants on (hip flexion with hip ER; hip flexion only does not bother it).   Relieving factors: hot shower, walking does not bother it. Movement. Not much improvement using a heating pad.    No latex allergies   PAIN:  Are you having pain? Yes: NPRS scale: 0/10 Pain location: L low back and posterior hip Pain description: tight ache  PRECAUTIONS: None  RED FLAGS: Bowel or bladder incontinence: No and Cauda equina syndrome: No   WEIGHT BEARING RESTRICTIONS: No  FALLS:  Has patient fallen in last 6 months? 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   NEXT MD VISIT: November 2024 (first week)  OBJECTIVE:     PATIENT SURVEYS:  FOTO Lumbar Spine  56  SCREENING FOR RED FLAGS: Bowel or bladder incontinence: No Cauda equina syndrome: No   COGNITION: Overall cognitive status: Within functional limits for tasks assessed     SENSATION: WFL   POSTURE: Forward neck, R cervical lateral shift, B protracted shoulders, R lateral shift, movement preference around L1/2 area, R iliac crest slightly higher. L foot pronation  PALPATION: No TTP low back, L posterior and lateral hip, and greater trochanter TTP L piriformis and glute med muscles No TTP with R and L UPA to lumbar transverse processes     LUMBAR ROM:   AROM eval  Flexion WFL, slight L trunk rotation  Extension WFL  Right lateral flexion WFL with R low back pressure  Left lateral flexion WFL with L low back pressure (reproduction of symptoms)  Right rotation WFL  Left rotation WFL   (Blank rows = not tested)  LOWER EXTREMITY ROM:     Passive  Right eval Left eval  Hip flexion    Hip extension    Hip abduction    Hip adduction    Hip internal rotation 18 (with hip joint pain) 26 with possible L posterior hip symptoms.   Hip external rotation [facility] with reproduction of L lateral dull ache pain  Knee flexion    Knee extension    Ankle dorsiflexion    Ankle plantarflexion    Ankle inversion    Ankle eversion     (Blank rows = not tested)  LOWER EXTREMITY MMT:    MMT Right eval Left eval  Hip flexion 4+ 4  Hip extension 4- 3+  Hip abduction 4 4  Hip adduction    Hip internal rotation    Hip external rotation    Knee flexion 5 4+  Knee extension 5 5  Ankle dorsiflexion    Ankle plantarflexion    Ankle inversion    Ankle eversion     (  Blank rows = not tested)  LUMBAR SPECIAL TESTS:  (-) repeated flexion L Slump did not reproduce L low back pain (-) R piriformis test Possible reproduction of L posterior hip symtpoms with L piriformis test  Long sit test suggests anterior nutation of L innominate.    FUNCTIONAL TESTS:    GAIT: Distance  walked: 60 ft Assistive device utilized: None Level of assistance: Complete Independence Comments: pelvic drop, decreased stance R LE  TODAY'S TREATMENT:                                                                                                                              DATE: 08/20/2023    Therapeutic exercise  Seated hip ER yellow band   L 10x2  R 10x2  Bridge 10x2 with 5 second holds  Supine lower trunk rotation 10x5 seconds   Standing static lunge with contralateral UE assist PRN  R 10x2  L 10x2  Standing B shoulder extension with scapular retraction red band to promote thoracic extension and decrease stress to low back 10x with 5 second holds   Cues to decrease R > L shoulder shrug compensation.    Seated thoracic extension at chair 10x3 with 5 second holds to decrease extension stress to low back  Seated hip adduction isometrics small green ball squeeze 10x5 seconds for 3 sets  S/L reverse clam shell  L 10x5 seconds for 2 sets  R 10x5 seconds for 2 sets  Single leg dead lift with contralateral UE assist  L 10x2  R 10x2   Improved exercise technique, movement at target joints, use of target muscles after mod verbal, visual, tactile cues.   Pt tolerated session well without aggravation of symptoms.          PATIENT EDUCATION:  Education details: there-ex, HEP, POC Person educated: Patient Education method: Explanation, Demonstration, Tactile cues, Verbal cues, and Handouts Education comprehension: verbalized understanding and returned demonstration  HOME EXERCISE PROGRAM: Access Code: [email or web address] URL: [email or web address] Date: 08/05/2023 Prepared by: Loralyn Freshwater  Exercises - Seated Piriformis Stretch  - 3 x daily - 7 x weekly - 3 sets - 3-5 reps - 30 seconds hold  - Bilateral Bent Leg Lift  - 1 x daily - 7 x weekly - 3 sets - 10 reps  - Supine Bridge  - 1 x daily - 7 x weekly - 3 sets - 10 reps - 5 seconds hold  - Sidelying  Reverse Clamshell  - 1 x daily - 7 x weekly - 3 sets - 10 reps - 5 seconds hold  - Static Lunge  - 1 x daily - 7 x weekly - 2 sets - 10 reps       ASSESSMENT:  CLINICAL IMPRESSION:  Improving  low back and L hip pain and function based on subjective reports and improved FOTO score. Worked on improving trunk mobility, thoracic extension and glute strength to decrease stress to her low back.  Pt tolerated session well without aggravation of symptoms. Pt will benefit from continued skilled physical therapy services to improve strength, function, and decrease pain.      OBJECTIVE IMPAIRMENTS: decreased ROM, decreased strength, improper body mechanics, postural dysfunction, and pain.   ACTIVITY LIMITATIONS: sitting, dressing, and getting into and out of cars  PARTICIPATION LIMITATIONS:  none  PERSONAL FACTORS: Age, Fitness, Profession, and Time since onset of injury/illness/exacerbation are also affecting patient's functional outcome.   REHAB POTENTIAL: Fair    CLINICAL DECISION MAKING: Stable/uncomplicated  EVALUATION COMPLEXITY: Low   GOALS: Goals reviewed with patient? Yes  SHORT TERM GOALS: Target date: 08/30/2023   Pt will be independent with her initial HEP to decrease pain, improve strength, function, and ability to perform car transfers and don and doff pants more comfortably.  Baseline: Pt has started her initial HEP (08/05/2023) Goal status: INITIAL   LONG TERM GOALS: Target date: 10/04/2023  Pt will improve her lumbar spine FOTO score by at least 10 points as a demonstration of improved function.  Baseline: Lumbar Spine FOTO 56 (08/05/2023); 75 (08/20/2023) Goal status: Achieved  2.  Pt will improve bilateral hip extension and abduction strength by at least 1/2 MMT grade to promote ability to perform car transfers, don and doff pants more comfortably.  Baseline:  MMT Right eval Left eval  Hip flexion 4+ 4  Hip extension 4- 3+  Hip abduction 4 4  Hip  adduction    Hip internal rotation    Hip external rotation          Knee flexion 5 4+  Knee extension 5 5  Ankle dorsiflexion    Ankle plantarflexion    Ankle inversion    Ankle eversion     Goal status: INITIAL  3.  Pt will report no low back or posterior hip pain with getting into and out of her car, as well as with donning and doffing her pants or shorts to promote ability to do her ADLs more comfortably.  Baseline: increased low back and L posterior hip pain with aforementioned activities. No low back and posterior hip pain with getting into and out of the car, slight symptoms with donning and doffing pants and shorts (greatly improved per pt) (08/20/2023) Goal status: partially met   PLAN:  PT FREQUENCY: 2x/week  PT DURATION: 8 weeks  PLANNED INTERVENTIONS: Therapeutic exercises, Therapeutic activity, Neuromuscular re-education, Patient/Family education, Joint mobilization, Joint manipulation, Aquatic Therapy, Dry Needling, Electrical stimulation, Spinal manipulation, Spinal mobilization, Traction, Ionotophoresis 4mg /ml Dexamethasone, and Manual therapy.  PLAN FOR NEXT SESSION: Improve L and R hip IR, glute med and max strength, manual techniques, modalities PRN  Loralyn Freshwater PT, DPT  08/20/2023, 3:30 PM

## 2023-08-21 ENCOUNTER — Other Ambulatory Visit: Payer: Self-pay | Admitting: Gastroenterology

## 2023-08-21 DIAGNOSIS — K633 Ulcer of intestine: Secondary | ICD-10-CM

## 2023-08-22 ENCOUNTER — Ambulatory Visit: Payer: Federal, State, Local not specified - PPO

## 2023-08-22 DIAGNOSIS — M25552 Pain in left hip: Secondary | ICD-10-CM | POA: Diagnosis not present

## 2023-08-22 DIAGNOSIS — M5459 Other low back pain: Secondary | ICD-10-CM

## 2023-08-22 NOTE — Therapy (Signed)
OUTPATIENT PHYSICAL THERAPY  Treatment And Discharge Summary   Patient Name: Kelly Tanner MRN: [id number] DOB:[birth year], 62 y.o., female Today's Date: 08/22/2023  END OF SESSION:  PT End of Session - 08/22/23 1431     Visit Number 6    Number of Visits 17    Date for PT Re-Evaluation 10/04/23    Authorization - Number of Visits 50    PT Start Time 1431    PT Stop Time 1511    PT Time Calculation (min) 40 min    Activity Tolerance Patient tolerated treatment well    Behavior During Therapy [facility] for tasks assessed/performed                  Past Medical History:  Diagnosis Date   Allergy    septra   Basal cell carcinoma    Hyperlipidemia    Past Surgical History:  Procedure Laterality Date   CYST REMOVAL NECK     EYE SURGERY  05/2017   REFRACTIVE SURGERY     SKIN CANCER EXCISION  12/11/1999   basal cell   Patient Active Problem List   Diagnosis Date Noted   B12 deficiency 07/16/2023   Low back pain 07/15/2023   Joint pain 07/08/2022   Tachycardia 07/08/2022   Cough 02/26/2021   Nasal congestion 12/25/2020   Postmenopausal vaginal bleeding 12/10/2020   RUQ pain 12/20/2019   Leukocytosis 12/20/2019   Right shoulder pain 12/20/2019   Sleep difficulties 09/07/2019   Sore throat 09/09/2017   GERD (gastroesophageal reflux disease) 05/16/2017   Diarrhea 09/18/2015   Health care maintenance 04/17/2015   Elevated blood pressure reading 08/23/2014   Stress 04/06/2014   Anemia 10/11/2013   Hematuria 04/23/2013   Hypercholesterolemia 01/04/2013   Skin cancer 01/04/2013   Family history of colon cancer 01/04/2013   Interstitial cystitis 01/04/2013    PCP: Dale Egan, MD  REFERRING PROVIDER: Dale Annandale, MD  REFERRING DIAG: M54.50 (ICD-10-CM) - Midline low back pain without sciatica, unspecified chronicity  Rationale for Evaluation and Treatment: Rehabilitation  THERAPY DIAG:  Other low back pain  Pain in left hip  ONSET DATE:  07/15/2023  SUBJECTIVE:                                                                                                                                                                                           SUBJECTIVE STATEMENT: B quadriceps are really sore from the lunge exercise. No low back and L posterior hip pain.     PERTINENT HISTORY:  Midline low back pain. Mainly on L side L lateral hip and L low back. Does not  expect to be totally pain free. Uncomfortable during the morning and the hot shower helps. Just wants to be able to not have to take the advil all the time. Pain started gradually over a year ago. Pt was in beach vacation with grandchildren the end of July in which pt had to carry her grand children, walk up the stairs. Denies loss of bowel and bladder control or saddle anesthesia. Has not yet had imaging for her back.   Aggravating factors: getting in and out of a car, sitting for a while, about an hour, first thing in the morning, putting her pants on (hip flexion with hip ER; hip flexion only does not bother it).   Relieving factors: hot shower, walking does not bother it. Movement. Not much improvement using a heating pad.    No latex allergies   PAIN:  Are you having pain? Yes: NPRS scale: 0/10 Pain location: L low back and posterior hip Pain description: tight ache  PRECAUTIONS: None  RED FLAGS: Bowel or bladder incontinence: No and Cauda equina syndrome: No   WEIGHT BEARING RESTRICTIONS: No  FALLS:  Has patient fallen in last 6 months? 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   NEXT MD VISIT: November 2024 (first week)  OBJECTIVE:     PATIENT SURVEYS:  FOTO Lumbar Spine 56  SCREENING FOR RED  FLAGS: Bowel or bladder incontinence: No Cauda equina syndrome: No   COGNITION: Overall cognitive status: Within functional limits for tasks assessed     SENSATION: WFL   POSTURE: Forward neck, R cervical lateral shift, B protracted shoulders, R lateral shift, movement preference around L1/2 area, R iliac crest slightly higher. L foot pronation  PALPATION: No TTP low back, L posterior and lateral hip, and greater trochanter TTP L piriformis and glute med muscles No TTP with R and L UPA to lumbar transverse processes     LUMBAR ROM:   AROM eval  Flexion WFL, slight L trunk rotation  Extension WFL  Right lateral flexion WFL with R low back pressure  Left lateral flexion WFL with L low back pressure (reproduction of symptoms)  Right rotation WFL  Left rotation WFL   (Blank rows = not tested)  LOWER EXTREMITY ROM:     Passive  Right eval Left eval  Hip flexion    Hip extension    Hip abduction    Hip adduction    Hip internal rotation 18 (with hip joint pain) 26 with possible L posterior hip symptoms.   Hip external rotation [facility] with reproduction of L lateral dull ache pain  Knee flexion    Knee extension    Ankle dorsiflexion    Ankle plantarflexion    Ankle inversion    Ankle eversion     (Blank rows = not tested)  LOWER EXTREMITY MMT:    MMT Right eval Left eval  Hip flexion 4+ 4  Hip extension 4- 3+  Hip abduction 4 4  Hip adduction    Hip internal rotation    Hip external rotation    Knee flexion 5 4+  Knee extension 5 5  Ankle dorsiflexion    Ankle plantarflexion    Ankle inversion    Ankle eversion     (Blank rows =  not tested)  LUMBAR SPECIAL TESTS:  (-) repeated flexion L Slump did not reproduce L low back pain (-) R piriformis test Possible reproduction of L posterior hip symtpoms with L piriformis test  Long sit test suggests anterior nutation of L innominate.    FUNCTIONAL TESTS:    GAIT: Distance walked: 60  ft Assistive device utilized: None Level of assistance: Complete Independence Comments: pelvic drop, decreased stance R LE  TODAY'S TREATMENT:                                                                                                                              DATE: 08/22/2023    Therapeutic exercise  Manually resisted prone hip extension, S/L hip abduction  Prone glute max set  R 10x5 seconds   L 10x5 seconds    Seated hip ER yellow band   L 10x2  R 10x2  Seated hip adduction isometrics small green ball squeeze 10x5 seconds for 3 sets  Supine lower trunk rotation 10x5 seconds  for 2 sets  S/L reverse clam shell  L 10x5 seconds for 2 sets  R 10x5 seconds for 2 sets  Seated thoracic extension at chair 10x3 with 5 second holds to decrease extension stress to low back     Improved exercise technique, movement at target joints, use of target muscles after mod verbal, visual, tactile cues.   Pt tolerated session well without aggravation of symptoms.          PATIENT EDUCATION:  Education details: there-ex, HEP, POC Person educated: Patient Education method: Explanation, Demonstration, Tactile cues, Verbal cues, and Handouts Education comprehension: verbalized understanding and returned demonstration  HOME EXERCISE PROGRAM: Access Code: [email or web address] URL: [email or web address] Date: 08/05/2023 Prepared by: Loralyn Freshwater  Exercises - Seated Piriformis Stretch  - 3 x daily - 7 x weekly - 3 sets - 3-5 reps - 30 seconds hold  - Bilateral Bent Leg Lift  - 1 x daily - 7 x weekly - 3 sets - 10 reps  - Supine Bridge  - 1 x daily - 7 x weekly - 3 sets - 10 reps - 5 seconds hold  - Sidelying Reverse Clamshell  - 1 x daily - 7 x weekly - 3 sets - 10 reps - 5 seconds hold  - Static Lunge  - 1 x daily - 7 x weekly - 2 sets - 10 reps  - Seated Thoracic Lumbar Extension  - 1 x daily - 7 x weekly - 3 sets - 10 reps - 5 seconds  hold     ASSESSMENT:  CLINICAL IMPRESSION:   Pt demonstrates improved low back and L hip pain, hip strength and function since initial evaluation. Pt has made very good progress with PT towards goals and demonstrates independence with her HEP. Skilled physical therapy services discharged with pt continuing her progress with her exercises at home.       OBJECTIVE IMPAIRMENTS: decreased ROM, decreased strength, improper  body mechanics, postural dysfunction, and pain.   ACTIVITY LIMITATIONS: sitting, dressing, and getting into and out of cars  PARTICIPATION LIMITATIONS:  none  PERSONAL FACTORS: Age, Fitness, Profession, and Time since onset of injury/illness/exacerbation are also affecting patient's functional outcome.   REHAB POTENTIAL: Fair    CLINICAL DECISION MAKING: Stable/uncomplicated  EVALUATION COMPLEXITY: Low   GOALS: Goals reviewed with patient? Yes  SHORT TERM GOALS: Target date: 08/30/2023   Pt will be independent with her initial HEP to decrease pain, improve strength, function, and ability to perform car transfers and don and doff pants more comfortably.  Baseline: Pt has started her initial HEP (08/05/2023), Able to perform HEP, no questions (08/22/2023) Goal status: Achieved   LONG TERM GOALS: Target date: 10/04/2023  Pt will improve her lumbar spine FOTO score by at least 10 points as a demonstration of improved function.  Baseline: Lumbar Spine FOTO 56 (08/05/2023); 75 (08/20/2023) Goal status: Achieved  2.  Pt will improve bilateral hip extension and abduction strength by at least 1/2 MMT grade to promote ability to perform car transfers, don and doff pants more comfortably.  Baseline:  MMT Right eval Left eval R  ( 08/22/2023) L  ( 08/22/2023)  Hip flexion 4+ 4    Hip extension 4- 3+ 4 4+  Hip abduction 4 4 4+ 4+  Hip adduction      Hip internal rotation      Hip external rotation            Knee flexion 5 4+    Knee extension 5 5    Ankle  dorsiflexion      Ankle plantarflexion      Ankle inversion      Ankle eversion       Goal status: achieved  3.  Pt will report no low back or posterior hip pain with getting into and out of her car, as well as with donning and doffing her pants or shorts to promote ability to do her ADLs more comfortably.  Baseline: increased low back and L posterior hip pain with aforementioned activities. No low back and posterior hip pain with getting into and out of the car, slight symptoms with donning and doffing pants and shorts (greatly improved per pt) (08/20/2023) Goal status: partially met   PLAN:  PT FREQUENCY: 2x/week  PT DURATION: 8 weeks  PLANNED INTERVENTIONS: Therapeutic exercises, Therapeutic activity, Neuromuscular re-education, Patient/Family education, Joint mobilization, Joint manipulation, Aquatic Therapy, Dry Needling, Electrical stimulation, Spinal manipulation, Spinal mobilization, Traction, Ionotophoresis 4mg /ml Dexamethasone, and Manual therapy.  PLAN FOR NEXT SESSION: Improve L and R hip IR, glute med and max strength, manual techniques, modalities PRN   Thank you for your referral.   Loralyn Freshwater PT, DPT  08/22/2023, 3:19 PM

## 2023-08-26 ENCOUNTER — Ambulatory Visit: Payer: Federal, State, Local not specified - PPO

## 2023-09-02 ENCOUNTER — Ambulatory Visit (INDEPENDENT_AMBULATORY_CARE_PROVIDER_SITE_OTHER): Payer: Federal, State, Local not specified - PPO

## 2023-09-02 DIAGNOSIS — E538 Deficiency of other specified B group vitamins: Secondary | ICD-10-CM

## 2023-09-02 MED ORDER — CYANOCOBALAMIN 1000 MCG/ML IJ SOLN
1000.0000 ug | Freq: Once | INTRAMUSCULAR | Status: AC
Start: 2023-09-02 — End: 2023-09-02
  Administered 2023-09-02: 1000 ug via INTRAMUSCULAR

## 2023-09-02 NOTE — Progress Notes (Signed)
Pt presented for their vitamin B12 injection. Pt was identified through two identifiers. Pt tolerated shot well in their left  deltoid.  

## 2023-09-17 DIAGNOSIS — K5000 Crohn's disease of small intestine without complications: Secondary | ICD-10-CM | POA: Diagnosis not present

## 2023-09-17 DIAGNOSIS — K633 Ulcer of intestine: Secondary | ICD-10-CM | POA: Diagnosis not present

## 2023-09-18 ENCOUNTER — Ambulatory Visit
Admission: RE | Admit: 2023-09-18 | Discharge: 2023-09-18 | Disposition: A | Discharge status: Home / Self Care | Payer: Federal, State, Local not specified - PPO | Source: Ambulatory Visit | Attending: Gastroenterology | Admitting: Gastroenterology

## 2023-09-18 DIAGNOSIS — R933 Abnormal findings on diagnostic imaging of other parts of digestive tract: Secondary | ICD-10-CM | POA: Diagnosis not present

## 2023-09-18 DIAGNOSIS — K633 Ulcer of intestine: Secondary | ICD-10-CM | POA: Diagnosis not present

## 2023-09-18 MED ORDER — IOHEXOL 300 MG/ML  SOLN
100.0000 mL | Freq: Once | INTRAMUSCULAR | Status: AC | PRN
  Administered 2023-09-18: 100 mL via INTRAVENOUS

## 2023-09-20 DIAGNOSIS — R197 Diarrhea, unspecified: Secondary | ICD-10-CM | POA: Diagnosis not present

## 2023-09-30 ENCOUNTER — Encounter: Payer: Self-pay | Admitting: Internal Medicine

## 2023-09-30 ENCOUNTER — Ambulatory Visit: Payer: Federal, State, Local not specified - PPO

## 2023-09-30 NOTE — Telephone Encounter (Signed)
FYI I have scheduled her to see you tomorrow morning so you can look in her nose. I cancelled her b12 appt for today and advised will give b12 inj at her appt in the AM

## 2023-10-01 ENCOUNTER — Ambulatory Visit: Payer: Federal, State, Local not specified - PPO | Admitting: Internal Medicine

## 2023-10-01 ENCOUNTER — Encounter: Payer: Self-pay | Admitting: Internal Medicine

## 2023-10-01 VITALS — BP 122/72 | HR 87 | Temp 98.2°F | Resp 16 | Ht 66.0 in | Wt 156.4 lb

## 2023-10-01 DIAGNOSIS — K5090 Crohn's disease, unspecified, without complications: Secondary | ICD-10-CM | POA: Insufficient documentation

## 2023-10-01 DIAGNOSIS — F439 Reaction to severe stress, unspecified: Secondary | ICD-10-CM

## 2023-10-01 DIAGNOSIS — E538 Deficiency of other specified B group vitamins: Secondary | ICD-10-CM

## 2023-10-01 DIAGNOSIS — K219 Gastro-esophageal reflux disease without esophagitis: Secondary | ICD-10-CM

## 2023-10-01 DIAGNOSIS — J3489 Other specified disorders of nose and nasal sinuses: Secondary | ICD-10-CM | POA: Insufficient documentation

## 2023-10-01 DIAGNOSIS — K50919 Crohn's disease, unspecified, with unspecified complications: Secondary | ICD-10-CM | POA: Diagnosis not present

## 2023-10-01 MED ORDER — MUPIROCIN 2 % EX OINT: 1.0000 | TOPICAL_OINTMENT | Freq: Two times a day (BID) | CUTANEOUS | 0 refills | Status: DC

## 2023-10-01 MED ORDER — CYANOCOBALAMIN 1000 MCG/ML IJ SOLN
1000.0000 ug | Freq: Once | INTRAMUSCULAR | Status: AC
Start: 2023-10-01 — End: 2023-10-01
  Administered 2023-10-01: 1000 ug via INTRAMUSCULAR

## 2023-10-01 NOTE — Assessment & Plan Note (Signed)
Off advil.  Pepcid.  Follow.

## 2023-10-01 NOTE — Assessment & Plan Note (Signed)
Nasal lesions as outlined.  Treat with bactroban as directed.  Follow. Call with update.

## 2023-10-01 NOTE — Assessment & Plan Note (Signed)
B12 injection today 

## 2023-10-01 NOTE — Progress Notes (Signed)
Subjective:    Patient ID: Kelly Tanner, female    DOB: [birth year], 62 y.o.   MRN: [id number]  Patient here for  Chief Complaint  Patient presents with   Nasal Lesion    HPI Here for work in appt.  Work in with concerns regarding - sores - in her nose. First noticed lesion - right nares.  Developed another lesion - right.  Uses neosporin and vaseline.  Better.  Developed lesion on left. She also reported recent GI evaluation. Recent CT scan - 4 cm segment of luminal narrowing in the distal ileum leading up the terminal ileum with mucosal enhancement. Findings suggestive  of inflammation of the terminal ileum. Differential would include ileitis versus inflammatory bowel disease. No small bowel dilatation. No fistula or abscess. No abnormality the cecum or ascending colon identified. No abnormality elsewhere in the GI tract.  Given above and elevated fecal calprotectin - was diagnosed with Crohn's. She has stopped advil. Was taking an increased amount.  Still some intermittent diarrhea.  No abdominal pain.  Has noticed some acid reflux. Discussed taking pepcid. Last visit, started buspar. Did not feel she tolerated.     Past Medical History:  Diagnosis Date   Allergy    septra   Basal cell carcinoma    Hyperlipidemia    Past Surgical History:  Procedure Laterality Date   CYST REMOVAL NECK     EYE SURGERY  05/2017   REFRACTIVE SURGERY     SKIN CANCER EXCISION  12/11/1999   basal cell   Family History  Problem Relation Age of Onset   Lung cancer Other        parent   Colon cancer Other        parent   Cancer Mother    Cancer Father    COPD Father    Social History   Socioeconomic History   Marital status: Married    Spouse name: Not on file   Number of children: 2   Years of education: Not on file   Highest education level: Bachelor's degree (e.g., BA, AB, BS)  Occupational History   Not on file  Tobacco Use   Smoking status: Never   Smokeless tobacco: Never   Substance and Sexual Activity   Alcohol use: Yes   Drug use: No   Sexual activity: Yes    Birth control/protection: Post-menopausal  Other Topics Concern   Not on file  Social History Narrative   Lives at home in [place].    Married with kids   Social Determinants of Health   Financial Resource Strain: Low Risk  (09/30/2023)   Overall Financial Resource Strain (CARDIA)    Difficulty of Paying Living Expenses: Not hard at all  Food Insecurity: No Food Insecurity (09/30/2023)   Hunger Vital Sign    Worried About Running Out of Food in the Last Year: Never true    Ran Out of Food in the Last Year: Never true  Transportation Needs: No Transportation Needs (09/30/2023)   PRAPARE - Administrator, Civil Service (Medical): No    Lack of Transportation (Non-Medical): No  Physical Activity: Sufficiently Active (09/30/2023)   Exercise Vital Sign    Days of Exercise per Week: 3 days    Minutes of Exercise per Session: 60 min  Stress: Stress Concern Present (09/30/2023)   [employer] of Occupational Health - Occupational Stress Questionnaire    Feeling of Stress : To some extent  Social Connections: Socially  Integrated (09/30/2023)   Social Connection and Isolation Panel [NHANES]    Frequency of Communication with Friends and Family: More than three times a week    Frequency of Social Gatherings with Friends and Family: Not on file    Attends Religious Services: More than 4 times per year    Active Member of [employer] or Organizations: Yes    Attends Engineer, structural: More than 4 times per year    Marital Status: Married     Review of Systems  Constitutional:  Negative for appetite change and unexpected weight change.  HENT:  Negative for congestion and sinus pressure.        Nasal lesions as outlined.   Respiratory:  Negative for cough, chest tightness and shortness of breath.   Cardiovascular:  Negative for chest pain, palpitations and leg  swelling.  Gastrointestinal:  Negative for abdominal pain and vomiting.       Intermittent diarrhea.   Genitourinary:  Negative for difficulty urinating and dysuria.  Musculoskeletal:  Negative for joint swelling and myalgias.  Skin:  Negative for color change and rash.  Neurological:  Negative for dizziness and headaches.  Psychiatric/Behavioral:  Negative for agitation and dysphoric mood.        Objective:     BP 122/72   Pulse 87   Temp 98.2 F (36.8 C)   Resp 16   Ht 5\' 6"  (1.676 m)   Wt 156 lb 6.4 oz (70.9 kg)   LMP 11/02/2010   SpO2 99%   BMI 25.24 kg/m  Wt Readings from Last 3 Encounters:  10/01/23 156 lb 6.4 oz (70.9 kg)  07/15/23 157 lb 12.8 oz (71.6 kg)  01/08/23 155 lb 6.4 oz (70.5 kg)    Physical Exam Vitals reviewed.  Constitutional:      General: She is not in acute distress.    Appearance: Normal appearance.  HENT:     Head: Normocephalic and atraumatic.     Right Ear: External ear normal.     Left Ear: External ear normal.     Nose:     Comments: Erythema - right and left nares - small circular lesion - internal right nares.  Eyes:     General: No scleral icterus.       Right eye: No discharge.        Left eye: No discharge.     Conjunctiva/sclera: Conjunctivae normal.  Neck:     Thyroid: No thyromegaly.  Cardiovascular:     Rate and Rhythm: Normal rate and regular rhythm.  Pulmonary:     Effort: No respiratory distress.     Breath sounds: Normal breath sounds. No wheezing.  Abdominal:     General: Bowel sounds are normal.     Palpations: Abdomen is soft.     Tenderness: There is no abdominal tenderness.  Musculoskeletal:        General: No swelling or tenderness.     Cervical back: Neck supple. No tenderness.  Lymphadenopathy:     Cervical: No cervical adenopathy.  Skin:    Findings: No erythema or rash.  Neurological:     Mental Status: She is alert.  Psychiatric:        Mood and Affect: Mood normal.        Behavior: Behavior  normal.      Outpatient Encounter Medications as of 10/01/2023  Medication Sig   mupirocin ointment (BACTROBAN) 2 % Apply 1 Application topically 2 (two) times daily.   ALPRAZolam Prudy Feeler)  0.25 MG tablet TAKE ONE TABLET BY MOUTH ONCE DAILY AS NEEDED   estradiol (ESTRACE) 0.1 MG/GM vaginal cream As directed.   famotidine (PEPCID) 20 MG tablet Take by mouth as needed.   Ibuprofen (ADVIL PO) Take by mouth.   [DISCONTINUED] busPIRone (BUSPAR) 5 MG tablet Take 1 tablet (5 mg total) by mouth daily. (Patient not taking: Reported on 08/05/2023)   [DISCONTINUED] triamcinolone (KENALOG) 0.1 % Apply topically. (Patient not taking: Reported on 08/05/2023)   Facility-Administered Encounter Medications as of 10/01/2023  Medication   cyanocobalamin (VITAMIN B12) injection 1,000 mcg     Lab Results  Component Value Date   WBC 7.8 07/11/2023   HGB 11.9 (L) 07/11/2023   HCT 36.1 07/11/2023   PLT 282.0 07/11/2023   GLUCOSE 91 07/11/2023   CHOL 175 07/11/2023   TRIG 259.0 (H) 07/11/2023   HDL 36.50 (L) 07/11/2023   LDLDIRECT 85.0 07/11/2023   LDLCALC 109 (H) 01/03/2023   ALT 23 07/11/2023   AST 24 07/11/2023   NA 138 07/11/2023   K 4.3 07/11/2023   CL 102 07/11/2023   CREATININE 0.92 07/11/2023   BUN 12 07/11/2023   CO2 26 07/11/2023   TSH 2.47 07/11/2023    CT ENTERO ABD/PELVIS W CONTAST  Result Date: 09/26/2023 CLINICAL DATA:  Cecal inflammation in terminal ileum inflammation on colonoscopy. CT ABDOMEN AND PELVIS WITH CONTRAST (ENTEROGRAPHY) TECHNIQUE: Multidetector CT of the abdomen and pelvis during bolus administration of intravenous contrast. Negative oral contrast was given. RADIATION DOSE REDUCTION: This exam was performed according to the departmental dose-optimization program which includes automated exposure control, adjustment of the mA and/or kV according to patient size and/or use of iterative reconstruction technique. CONTRAST:  OMNIPAQUE IOHEXOL 300 MG/ML  SOLN  COMPARISON:  None Available. FINDINGS: Lower chest: Lung bases are clear. Hepatobiliary: No focal hepatic lesion. Normal gallbladder. No biliary duct dilatation. Common bile duct is normal. Pancreas: Pancreas is normal. No ductal dilatation. No pancreatic inflammation. Spleen: Normal spleen Adrenals/urinary tract: Adrenal glands and kidneys are normal. The ureters and bladder normal. Stomach/Bowel: GI tract is distended by negative oral contrast. Stomach and duodenum normal. Jejunum is normal. No small bowel dilatation or inflammation proximal small bowel. The ileum for the most part is normal without evidence of stricture or inflammation. There is narrowing of the distal ileum leading up the terminal ileum over a 4 cm segment (image 63/2). There is thin mucosal enhancement through this region. No fistula or abscess. No lymphadenopathy. The terminal ileum itself appears normal with normal fat at the terminal ileum. No clear abnormality of the cecum or ascending colon. The ascending and transverse colon normal. Descending colon normal. Normal rectosigmoid colon. Vascular/Lymphatic: Abdominal aorta is normal caliber. No periportal or retroperitoneal adenopathy. No pelvic adenopathy. Reproductive: Uterus and adnexa unremarkable. Other: No free fluid. Musculoskeletal: No aggressive osseous lesion. IMPRESSION: 1. A 4 cm segment of luminal narrowing in the distal ileum leading up the terminal ileum with mucosal enhancement. Findings suggestive of inflammation of the terminal ileum. Differential would include ileitis versus inflammatory bowel disease. No small bowel dilatation. No fistula or abscess. 2. No abnormality the cecum or ascending colon identified. 3. No abnormality elsewhere in the GI tract. Electronically Signed   By: Genevive Bi M.D.   On: 09/26/2023 12:20       Assessment & Plan:  B12 deficiency Assessment & Plan: B12 injection today.   Orders: -     Cyanocobalamin  Stress Assessment &  Plan: Increased stress.  Did  not tolerate buspar.  Follow.  Notify me if feels needs something more.    Nasal lesion Assessment & Plan: Nasal lesions as outlined.  Treat with bactroban as directed.  Follow. Call with update.    Crohn's disease with complication, unspecified gastrointestinal tract location [facility]) Assessment & Plan: Recently diagnosed as outlined.  Symptoms stable.  Has appt this week with GI to discuss treatment options.    Gastroesophageal reflux disease, unspecified whether esophagitis present Assessment & Plan: Off advil.  Pepcid.  Follow.    Other orders -     Mupirocin; Apply 1 Application topically 2 (two) times daily.  Dispense: 22 g; Refill: 0     Dale Iberia, MD

## 2023-10-01 NOTE — Assessment & Plan Note (Signed)
Recently diagnosed as outlined.  Symptoms stable.  Has appt this week with GI to discuss treatment options.

## 2023-10-01 NOTE — Assessment & Plan Note (Signed)
Increased stress.  Did not tolerate buspar.  Follow.  Notify me if feels needs something more.

## 2023-10-04 DIAGNOSIS — K5000 Crohn's disease of small intestine without complications: Secondary | ICD-10-CM | POA: Diagnosis not present

## 2023-10-17 ENCOUNTER — Ambulatory Visit: Payer: Federal, State, Local not specified - PPO | Admitting: Internal Medicine

## 2023-10-17 ENCOUNTER — Encounter: Payer: Self-pay | Admitting: Internal Medicine

## 2023-10-17 VITALS — BP 122/70 | HR 81 | Temp 98.3°F | Resp 16 | Ht 66.0 in | Wt 157.0 lb

## 2023-10-17 DIAGNOSIS — E538 Deficiency of other specified B group vitamins: Secondary | ICD-10-CM | POA: Diagnosis not present

## 2023-10-17 DIAGNOSIS — E559 Vitamin D deficiency, unspecified: Secondary | ICD-10-CM

## 2023-10-17 DIAGNOSIS — E7800 Pure hypercholesterolemia, unspecified: Secondary | ICD-10-CM

## 2023-10-17 DIAGNOSIS — Z23 Encounter for immunization: Secondary | ICD-10-CM

## 2023-10-17 DIAGNOSIS — K50919 Crohn's disease, unspecified, with unspecified complications: Secondary | ICD-10-CM | POA: Diagnosis not present

## 2023-10-17 DIAGNOSIS — J3489 Other specified disorders of nose and nasal sinuses: Secondary | ICD-10-CM

## 2023-10-17 DIAGNOSIS — F439 Reaction to severe stress, unspecified: Secondary | ICD-10-CM

## 2023-10-17 NOTE — Assessment & Plan Note (Addendum)
Current level at 24.9. Will start Vitamin D3 supplementation to 1000 IU daily and plan to recheck the Vitamin D level in a few months.

## 2023-10-17 NOTE — Assessment & Plan Note (Signed)
Increased stress.  Discussed.  Stress related to new diagnosis, treatment, etc. Follow.  Notify me if feels needs something more.

## 2023-10-17 NOTE — Assessment & Plan Note (Signed)
Resolved with bactroban.

## 2023-10-17 NOTE — Assessment & Plan Note (Signed)
Newly diagnosed with ileal involvement, they are apprehensive about starting Skyrizi (infliximab) due to potential side effects. We will initiate Skyrizi therapy through [facility] and continue monitoring for potential infections and side effects.

## 2023-10-17 NOTE — Assessment & Plan Note (Signed)
The 10-year ASCVD risk score (Arnett DK, et al., 2019) is: 4.3%   Values used to calculate the score:     Age: 62 years     Sex: Female     Is Non-Hispanic African American: No     Diabetic: No     Tobacco smoker: No     Systolic Blood Pressure: 122 mmHg     Is BP treated: No     HDL Cholesterol: 36.5 mg/dL     Total Cholesterol: 175 mg/dL  Low cholesterol diet and exercise.  Follow  lipid panel.

## 2023-10-17 NOTE — Assessment & Plan Note (Signed)
Recently started supplementation. We will continue monthly B12 supplementation and plan to recheck the B12 level in a few months.

## 2023-10-17 NOTE — Progress Notes (Addendum)
Subjective:    Patient ID: Kelly Tanner, female    DOB: [birth year], 62 y.o.   MRN: [id number]  Patient here for  Chief Complaint  Patient presents with   Medical Management of Chronic Issues    HPI  Discussed the use of AI scribe software for clinical note transcription with the patient, who gave verbal consent to proceed.  History of Present Illness   The patient, with a history of Crohn's disease, presented for a follow-up visit after a recent diagnosis of nasal sores. The patient reported significant improvement in the nasal sores after using bactroban. The patient also reported a recent diagnosis of Crohn's disease, which they are having difficulty accepting. They reported no abdominal pain.  Bowels stable.   The patient is awaiting insurance approval for a new medication, Skyrizi, which they are apprehensive about due to potential side effects. Discussed.    The patient also reported joint pain, which they believe may be related to Crohn's disease. They have been managing the pain with occasional Tylenol and have refrained from taking Advil due to potential gastrointestinal irritation.  The patient has been taking vitamin B12 and recently started vitamin D3 supplementation due to low levels identified in recent lab work.   The patient expressed anxiety about their health and the upcoming changes in their treatment plan. They reported feeling eager to start the new medication to see if it will improve their symptoms. They also expressed concern about potential interactions between the new medication and the flu vaccine, but decided to receive the flu vaccine during the visit.        Past Medical History:  Diagnosis Date   Allergy    septra   Basal cell carcinoma    Hyperlipidemia    Past Surgical History:  Procedure Laterality Date   CYST REMOVAL NECK     EYE SURGERY  05/2017   REFRACTIVE SURGERY     SKIN CANCER EXCISION  12/11/1999   basal cell   Family History   Problem Relation Age of Onset   Cancer Mother    Cancer Father    COPD Father    Lung cancer Other        parent   Colon cancer Other        parent   Social History   Socioeconomic History   Marital status: Married    Spouse name: Not on file   Number of children: 2   Years of education: Not on file   Highest education level: Bachelor's degree (e.g., BA, AB, BS)  Occupational History   Not on file  Tobacco Use   Smoking status: Never   Smokeless tobacco: Never  Substance and Sexual Activity   Alcohol use: Yes   Drug use: No   Sexual activity: Yes    Birth control/protection: Post-menopausal  Other Topics Concern   Not on file  Social History Narrative   Lives at home in [place].    Married with kids   Social Determinants of Health   Financial Resource Strain: Low Risk  (09/30/2023)   Overall Financial Resource Strain (CARDIA)    Difficulty of Paying Living Expenses: Not hard at all  Food Insecurity: No Food Insecurity (09/30/2023)   Hunger Vital Sign    Worried About Running Out of Food in the Last Year: Never true    Ran Out of Food in the Last Year: Never true  Transportation Needs: No Transportation Needs (09/30/2023)   PRAPARE - Transportation  Lack of Transportation (Medical): No    Lack of Transportation (Non-Medical): No  Physical Activity: Sufficiently Active (09/30/2023)   Exercise Vital Sign    Days of Exercise per Week: 3 days    Minutes of Exercise per Session: 60 min  Stress: Stress Concern Present (09/30/2023)   [employer] of Occupational Health - Occupational Stress Questionnaire    Feeling of Stress : To some extent  Social Connections: Socially Integrated (09/30/2023)   Social Connection and Isolation Panel [NHANES]    Frequency of Communication with Friends and Family: More than three times a week    Frequency of Social Gatherings with Friends and Family: Not on file    Attends Religious Services: More than 4 times per year     Active Member of [employer] or Organizations: Yes    Attends Engineer, structural: More than 4 times per year    Marital Status: Married     Review of Systems  Constitutional:  Negative for fever and unexpected weight change.  HENT:  Negative for congestion and sinus pressure.   Respiratory:  Negative for cough, chest tightness and shortness of breath.   Cardiovascular:  Negative for chest pain, palpitations and leg swelling.  Gastrointestinal:  Negative for abdominal pain, constipation, nausea and vomiting.       No bowel change.   Genitourinary:  Negative for difficulty urinating and dysuria.  Musculoskeletal:  Negative for joint swelling and myalgias.  Skin:  Negative for color change and rash.  Neurological:  Negative for dizziness and headaches.  Psychiatric/Behavioral:  Negative for agitation and dysphoric mood.        Objective:     BP 122/70   Pulse 81   Temp 98.3 F (36.8 C)   Resp 16   Ht 5\' 6"  (1.676 m)   Wt 157 lb (71.2 kg)   LMP 11/02/2010   SpO2 99%   BMI 25.34 kg/m  Wt Readings from Last 3 Encounters:  10/17/23 157 lb (71.2 kg)  10/01/23 156 lb 6.4 oz (70.9 kg)  07/15/23 157 lb 12.8 oz (71.6 kg)    Physical Exam Vitals reviewed.  Constitutional:      General: She is not in acute distress.    Appearance: Normal appearance.  HENT:     Head: Normocephalic and atraumatic.     Right Ear: External ear normal.     Left Ear: External ear normal.     Mouth/Throat:     Pharynx: No oropharyngeal exudate or posterior oropharyngeal erythema.  Eyes:     General: No scleral icterus.       Right eye: No discharge.        Left eye: No discharge.     Conjunctiva/sclera: Conjunctivae normal.  Neck:     Thyroid: No thyromegaly.  Cardiovascular:     Rate and Rhythm: Normal rate and regular rhythm.  Pulmonary:     Effort: No respiratory distress.     Breath sounds: Normal breath sounds. No wheezing.  Abdominal:     General: Bowel sounds are normal.      Palpations: Abdomen is soft.     Tenderness: There is no abdominal tenderness.  Musculoskeletal:        General: No swelling or tenderness.     Cervical back: Neck supple. No tenderness.  Lymphadenopathy:     Cervical: No cervical adenopathy.  Skin:    Findings: No erythema or rash.  Neurological:     Mental Status: She is alert.  Psychiatric:  Mood and Affect: Mood normal.        Behavior: Behavior normal.      Outpatient Encounter Medications as of 10/17/2023  Medication Sig   ALPRAZolam (XANAX) 0.25 MG tablet TAKE ONE TABLET BY MOUTH ONCE DAILY AS NEEDED   cyanocobalamin (VITAMIN B12) 1000 MCG/ML injection Inject 1,000 mcg into the muscle every 30 (thirty) days.   estradiol (ESTRACE) 0.1 MG/GM vaginal cream As directed.   famotidine (PEPCID) 20 MG tablet Take by mouth as needed.   mupirocin ointment (BACTROBAN) 2 % Apply 1 Application topically 2 (two) times daily.   [DISCONTINUED] Ibuprofen (ADVIL PO) Take by mouth.   No facility-administered encounter medications on file as of 10/17/2023.     Lab Results  Component Value Date   WBC 7.8 07/11/2023   HGB 11.9 (L) 07/11/2023   HCT 36.1 07/11/2023   PLT 282.0 07/11/2023   GLUCOSE 91 07/11/2023   CHOL 175 07/11/2023   TRIG 259.0 (H) 07/11/2023   HDL 36.50 (L) 07/11/2023   LDLDIRECT 85.0 07/11/2023   LDLCALC 109 (H) 01/03/2023   ALT 23 07/11/2023   AST 24 07/11/2023   NA 138 07/11/2023   K 4.3 07/11/2023   CL 102 07/11/2023   CREATININE 0.92 07/11/2023   BUN 12 07/11/2023   CO2 26 07/11/2023   TSH 2.47 07/11/2023    CT ENTERO ABD/PELVIS W CONTAST  Result Date: 09/26/2023 CLINICAL DATA:  Cecal inflammation in terminal ileum inflammation on colonoscopy. CT ABDOMEN AND PELVIS WITH CONTRAST (ENTEROGRAPHY) TECHNIQUE: Multidetector CT of the abdomen and pelvis during bolus administration of intravenous contrast. Negative oral contrast was given. RADIATION DOSE REDUCTION: This exam was performed according to the  departmental dose-optimization program which includes automated exposure control, adjustment of the mA and/or kV according to patient size and/or use of iterative reconstruction technique. CONTRAST:  OMNIPAQUE IOHEXOL 300 MG/ML  SOLN COMPARISON:  None Available. FINDINGS: Lower chest: Lung bases are clear. Hepatobiliary: No focal hepatic lesion. Normal gallbladder. No biliary duct dilatation. Common bile duct is normal. Pancreas: Pancreas is normal. No ductal dilatation. No pancreatic inflammation. Spleen: Normal spleen Adrenals/urinary tract: Adrenal glands and kidneys are normal. The ureters and bladder normal. Stomach/Bowel: GI tract is distended by negative oral contrast. Stomach and duodenum normal. Jejunum is normal. No small bowel dilatation or inflammation proximal small bowel. The ileum for the most part is normal without evidence of stricture or inflammation. There is narrowing of the distal ileum leading up the terminal ileum over a 4 cm segment (image 63/2). There is thin mucosal enhancement through this region. No fistula or abscess. No lymphadenopathy. The terminal ileum itself appears normal with normal fat at the terminal ileum. No clear abnormality of the cecum or ascending colon. The ascending and transverse colon normal. Descending colon normal. Normal rectosigmoid colon. Vascular/Lymphatic: Abdominal aorta is normal caliber. No periportal or retroperitoneal adenopathy. No pelvic adenopathy. Reproductive: Uterus and adnexa unremarkable. Other: No free fluid. Musculoskeletal: No aggressive osseous lesion. IMPRESSION: 1. A 4 cm segment of luminal narrowing in the distal ileum leading up the terminal ileum with mucosal enhancement. Findings suggestive of inflammation of the terminal ileum. Differential would include ileitis versus inflammatory bowel disease. No small bowel dilatation. No fistula or abscess. 2. No abnormality the cecum or ascending colon identified. 3. No abnormality elsewhere  in the GI tract. Electronically Signed   By: Genevive Bi M.D.   On: 09/26/2023 12:20       Assessment & Plan:  Crohn's disease with complication,  unspecified gastrointestinal tract location [facility]) Assessment & Plan: Newly diagnosed with ileal involvement, they are apprehensive about starting Skyrizi (infliximab) due to potential side effects. We will initiate Skyrizi therapy through [facility] and continue monitoring for potential infections and side effects.  Orders: -     CBC with Differential/Platelet; Future  Hypercholesterolemia Assessment & Plan: The 10-year ASCVD risk score (Arnett DK, et al., 2019) is: 4.3%   Values used to calculate the score:     Age: 41 years     Sex: Female     Is Non-Hispanic African American: No     Diabetic: No     Tobacco smoker: No     Systolic Blood Pressure: 122 mmHg     Is BP treated: No     HDL Cholesterol: 36.5 mg/dL     Total Cholesterol: 175 mg/dL  Low cholesterol diet and exercise.  Follow  lipid panel.   Orders: -     CBC with Differential/Platelet; Future -     Basic metabolic panel; Future -     Hepatic function panel; Future -     Lipid panel; Future  Vitamin D deficiency Assessment & Plan: Current level at 24.9. Will start Vitamin D3 supplementation to 1000 IU daily and plan to recheck the Vitamin D level in a few months.  Orders: -     VITAMIN D 25 Hydroxy (Vit-D Deficiency, Fractures); Future  B12 deficiency Assessment & Plan: Recently started supplementation. We will continue monthly B12 supplementation and plan to recheck the B12 level in a few months.  Orders: -     Vitamin B12; Future  Need for influenza vaccination -     Flu vaccine trivalent PF, 6mos and older(Flulaval,Afluria,Fluarix,Fluzone)  Stress Assessment & Plan: Increased stress.  Discussed.  Stress related to new diagnosis, treatment, etc. Follow.  Notify me if feels needs something more.    Nasal lesion Assessment & Plan: Resolved with  bactroban.      General Health Maintenance We will administer the influenza vaccine today and consider the shingles vaccine in the future, ensuring it is not a live vaccine. A follow-up appointment is scheduled in a few months to review lab results and assess their response to [facility] therapy.        Dale Sumatra, MD

## 2023-10-28 ENCOUNTER — Ambulatory Visit (INDEPENDENT_AMBULATORY_CARE_PROVIDER_SITE_OTHER): Payer: Federal, State, Local not specified - PPO

## 2023-10-28 DIAGNOSIS — E538 Deficiency of other specified B group vitamins: Secondary | ICD-10-CM

## 2023-10-28 MED ORDER — CYANOCOBALAMIN 1000 MCG/ML IJ SOLN
1000.0000 ug | Freq: Once | INTRAMUSCULAR | Status: AC
  Administered 2023-10-28: 1000 ug via INTRAMUSCULAR

## 2023-10-28 NOTE — Progress Notes (Signed)
Pt presented for their vitamin B12 injection. Pt was identified through two identifiers. Pt tolerated shot well in their right deltoid.  

## 2023-10-29 ENCOUNTER — Ambulatory Visit: Payer: Federal, State, Local not specified - PPO

## 2023-10-29 ENCOUNTER — Ambulatory Visit
Admission: EM | Admit: 2023-10-29 | Discharge: 2023-10-29 | Disposition: A | Discharge status: Home / Self Care | Payer: Federal, State, Local not specified - PPO | Attending: Physician Assistant | Admitting: Physician Assistant

## 2023-10-29 DIAGNOSIS — M19071 Primary osteoarthritis, right ankle and foot: Secondary | ICD-10-CM | POA: Diagnosis not present

## 2023-10-29 DIAGNOSIS — S92324A Nondisplaced fracture of second metatarsal bone, right foot, initial encounter for closed fracture: Secondary | ICD-10-CM | POA: Diagnosis not present

## 2023-10-29 DIAGNOSIS — M79671 Pain in right foot: Secondary | ICD-10-CM | POA: Diagnosis not present

## 2023-10-29 DIAGNOSIS — Z1231 Encounter for screening mammogram for malignant neoplasm of breast: Secondary | ICD-10-CM | POA: Diagnosis not present

## 2023-10-29 DIAGNOSIS — M25571 Pain in right ankle and joints of right foot: Secondary | ICD-10-CM | POA: Diagnosis not present

## 2023-10-29 LAB — HM MAMMOGRAPHY

## 2023-10-29 NOTE — Discharge Instructions (Signed)
-  You fractured your 2nd metatarsal bone. The official radiology report will be back in a couple of hours and I will message you on MyChart.  -Avoid weightbearing until cleared by ortho. Fracture usually heal in 2-6 weeks.  -Take Ibuprofen and Tylenol for pain -Follow up with ortho.  You have a condition requiring you to follow up with Orthopedics so please call one of the following office for appointment:   [facility] Address: [address], [place], [place] [address] Phone: [phone]  [facility] [address] [place], [place] [address] Phone: [phone]  [facility] [address], [place], [place] [address] Phone: [phone]

## 2023-10-29 NOTE — ED Provider Notes (Signed)
[facility]    CSN: [id number] Arrival date & time: 10/29/23  1016      History   Chief Complaint Chief Complaint  Patient presents with   Foot Pain   Fall    HPI Kelly Tanner is a 62 y.o. female presenting for right foot pain and swelling after a accidental fall yesterday.  She reports she was getting Christmas decorations out.  She ended up falling down a few stairs.  States she has an abrasion on her right elbow and pain of her right dorsal foot but otherwise feels okay.  Not reporting head injury or loss consciousness.  She does have pain of her foot when she bears weight.  No numbness, weakness or tingling.  HPI  Past Medical History:  Diagnosis Date   Allergy    septra   Basal cell carcinoma    Hyperlipidemia     Patient Active Problem List   Diagnosis Date Noted   Vitamin D deficiency 10/17/2023   Nasal lesion 10/01/2023   Crohn's disease (HCC) 10/01/2023   B12 deficiency 07/16/2023   Low back pain 07/15/2023   Joint pain 07/08/2022   Tachycardia 07/08/2022   Cough 02/26/2021   Urethral irritation 01/24/2021   Nasal congestion 12/25/2020   Postmenopausal vaginal bleeding 12/10/2020   RUQ pain 12/20/2019   Leukocytosis 12/20/2019   Right shoulder pain 12/20/2019   Sleep difficulties 09/07/2019   Sore throat 09/09/2017   GERD (gastroesophageal reflux disease) 05/16/2017   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   Incomplete emptying of bladder 01/21/2016   Diarrhea 09/18/2015   Health care maintenance 04/17/2015   Stress incontinence (female) (female) 12/23/2014   Elevated blood pressure reading 08/23/2014   Elevated blood-pressure reading without diagnosis of hypertension 08/23/2014   Stress 04/06/2014   Acute stress reaction 04/06/2014   Anemia 10/11/2013   Microhematuria 05/25/2013   Hematuria 04/23/2013   Hypercholesterolemia 01/04/2013   Skin cancer 01/04/2013   Family history of colon cancer 01/04/2013    Interstitial cystitis 01/04/2013   Family history of malignant neoplasm of gastrointestinal tract 01/04/2013    Past Surgical History:  Procedure Laterality Date   CYST REMOVAL NECK     EYE SURGERY  05/2017   REFRACTIVE SURGERY     SKIN CANCER EXCISION  12/11/1999   basal cell    OB History   No obstetric history on file.      Home Medications    Prior to Admission medications   Medication Sig Start Date End Date Taking? Authorizing Provider  ALPRAZolam Prudy Feeler) 0.25 MG tablet TAKE ONE TABLET BY MOUTH ONCE DAILY AS NEEDED 07/16/23  Yes Dale Magnolia, MD  cyanocobalamin (VITAMIN B12) 1000 MCG/ML injection Inject 1,000 mcg into the muscle every 30 (thirty) days.   Yes [provider]  estradiol (ESTRACE) 0.1 MG/GM vaginal cream As directed. 07/08/22  Yes Dale [place], MD  famotidine (PEPCID) 20 MG tablet Take by mouth as needed.   Yes [provider]  mupirocin ointment (BACTROBAN) 2 % Apply 1 Application topically 2 (two) times daily. 10/01/23  Yes Dale [place], MD    Family History Family History  Problem Relation Age of Onset   Cancer Mother    Cancer Father    COPD Father    Lung cancer Other        parent   Colon cancer Other        parent    Social History Social History   Tobacco Use  Smoking status: Never   Smokeless tobacco: Never  Substance Use Topics   Alcohol use: Yes   Drug use: No     Allergies   Sulfa antibiotics   Review of Systems Review of Systems  Musculoskeletal:  Positive for arthralgias, gait problem and joint swelling.  Skin:  Negative for color change and wound.  Neurological:  Negative for weakness and numbness.     Physical Exam Triage Vital Signs ED Triage Vitals  Encounter Vitals Group     BP 10/29/23 1034 (!) 161/90     Systolic BP Percentile --      Diastolic BP Percentile --      Pulse Rate 10/29/23 1034 98     Resp 10/29/23 1034 16     Temp 10/29/23 1034 99.1 F (37.3 C)     Temp Source  10/29/23 1034 Oral     SpO2 10/29/23 1034 98 %     Weight 10/29/23 1033 157 lb (71.2 kg)     Height 10/29/23 1033 5\' 6"  (1.676 m)     Head Circumference --      Peak Flow --      Pain Score 10/29/23 1037 5     Pain Loc --      Pain Education --      Exclude from Growth Chart --    No data found.  Updated Vital Signs BP (!) 161/90 (BP Location: Right Arm)   Pulse 98   Temp 99.1 F (37.3 C) (Oral)   Resp 16   Ht 5\' 6"  (1.676 m)   Wt 157 lb (71.2 kg)   LMP 11/02/2010   SpO2 98%   BMI 25.34 kg/m      Physical Exam Vitals and nursing note reviewed.  Constitutional:      General: She is not in acute distress.    Appearance: Normal appearance. She is not ill-appearing or toxic-appearing.  HENT:     Head: Normocephalic and atraumatic.  Eyes:     General: No scleral icterus.       Right eye: No discharge.        Left eye: No discharge.     Conjunctiva/sclera: Conjunctivae normal.  Cardiovascular:     Rate and Rhythm: Normal rate and regular rhythm.     Pulses: Normal pulses.  Pulmonary:     Effort: Pulmonary effort is normal. No respiratory distress.  Musculoskeletal:     Cervical back: Neck supple.     Right foot: Normal range of motion. Swelling (dorsal forefoot and midfoot) and bony tenderness (2nd metatarsal) present. Normal pulse.  Skin:    General: Skin is dry.  Neurological:     General: No focal deficit present.     Mental Status: She is alert. Mental status is at baseline.     Motor: No weakness.     Gait: Gait abnormal.  Psychiatric:        Mood and Affect: Mood normal.      UC Treatments / Results  Labs (all labs ordered are listed, but only abnormal results are displayed) Labs Reviewed - No data to display  EKG   Radiology No results found.  Procedures Procedures (including critical care time)  Medications Ordered in UC Medications - No data to display  Initial Impression / Assessment and Plan / UC Course  I have reviewed the triage  vital signs and the nursing notes.  Pertinent labs & imaging results that were available during my care of the patient were reviewed  by me and considered in my medical decision making (see chart for details).   62 year old female presents for right foot pain and swelling after accidental fall yesterday.  On exam she has swelling of the dorsal forefoot and midfoot and tenderness over the second metatarsal.  X-ray of foot and ankle obtained today shows fracture of second metatarsal.  Discussed this with patient.  Patient placed in cam boot.  Reviewed fracture care guidelines and RICE guidelines.  Advised for her to follow-up with orthopedics for further management and evaluation.  Information given to patient.  X-ray over read shows no acute fracture.  I called and discussed these results with patient.  Advised that she wear her cam boot over the next week.  Explained that she should try to come out of the boot after that but if she still having discomfort I would like her to follow-up with orthopedics for further evaluation of potential hairline fracture.  She has point tenderness in the area of concern/abnormality on the x-ray so is still like to treat this like a fracture at least over the next 1 week.   Final Clinical Impressions(s) / UC Diagnoses   Final diagnoses:  Closed nondisplaced fracture of second metatarsal bone of right foot, initial encounter  Right foot pain     Discharge Instructions      -You fractured your 2nd metatarsal bone. The official radiology report will be back in a couple of hours and I will message you on MyChart.  -Avoid weightbearing until cleared by ortho. Fracture usually heal in 2-6 weeks.  -Take Ibuprofen and Tylenol for pain -Follow up with ortho.  You have a condition requiring you to follow up with Orthopedics so please call one of the following office for appointment:   [facility] Address: [address] [place], [place] [address] Phone: [phone]  [facility] [address] [place], [place] [address] Phone: [phone]  [facility] [address] [place], [place] [address] Phone: [phone]      ED Prescriptions   None    PDMP not reviewed this encounter.   Shirlee Latch, PA-C 10/29/23 1413

## 2023-10-29 NOTE — ED Triage Notes (Addendum)
Pt c/o R foot pain due to fall yesterday. States she missed some steps & fell.

## 2023-10-30 ENCOUNTER — Telehealth: Payer: Self-pay | Admitting: Internal Medicine

## 2023-10-30 ENCOUNTER — Encounter: Payer: Self-pay | Admitting: Internal Medicine

## 2023-10-30 DIAGNOSIS — R928 Other abnormal and inconclusive findings on diagnostic imaging of breast: Secondary | ICD-10-CM

## 2023-10-30 NOTE — Telephone Encounter (Signed)
Orders have been pended for you to sign

## 2023-10-30 NOTE — Telephone Encounter (Signed)
Orders signed.

## 2023-10-30 NOTE — Telephone Encounter (Signed)
See mychart.  

## 2023-10-30 NOTE — Telephone Encounter (Signed)
See other note. Pt has already been called & she has sent two mychart message this morning.

## 2023-10-30 NOTE — Telephone Encounter (Signed)
Patient states [facility] is booking in February.  Patient states she would like to have her diagnostic mammogram earlier, so she would like for [place] to change the order to [facility].  Patient states she would like for [place] to please call her when the order has been placed at [facility].

## 2023-10-30 NOTE — Telephone Encounter (Signed)
Pt would like to be called regarding a mammogram

## 2023-10-30 NOTE — Telephone Encounter (Addendum)
Pt aware of below.

## 2023-10-31 ENCOUNTER — Inpatient Hospital Stay
Admission: RE | Admit: 2023-10-31 | Discharge: 2023-10-31 | Disposition: A | Discharge status: Home / Self Care | Payer: Self-pay | Source: Ambulatory Visit | Attending: Internal Medicine | Admitting: Internal Medicine

## 2023-10-31 ENCOUNTER — Other Ambulatory Visit: Payer: Self-pay | Admitting: *Deleted

## 2023-10-31 DIAGNOSIS — Z1231 Encounter for screening mammogram for malignant neoplasm of breast: Secondary | ICD-10-CM

## 2023-11-04 ENCOUNTER — Ambulatory Visit
Admission: RE | Admit: 2023-11-04 | Discharge: 2023-11-04 | Disposition: A | Discharge status: Home / Self Care | Payer: Federal, State, Local not specified - PPO | Source: Ambulatory Visit | Attending: Internal Medicine | Admitting: Internal Medicine

## 2023-11-04 DIAGNOSIS — R92333 Mammographic heterogeneous density, bilateral breasts: Secondary | ICD-10-CM | POA: Diagnosis not present

## 2023-11-04 DIAGNOSIS — R928 Other abnormal and inconclusive findings on diagnostic imaging of breast: Secondary | ICD-10-CM | POA: Diagnosis not present

## 2023-11-04 DIAGNOSIS — N6320 Unspecified lump in the left breast, unspecified quadrant: Secondary | ICD-10-CM | POA: Diagnosis not present

## 2023-11-04 DIAGNOSIS — N6002 Solitary cyst of left breast: Secondary | ICD-10-CM | POA: Diagnosis not present

## 2023-11-25 ENCOUNTER — Ambulatory Visit (INDEPENDENT_AMBULATORY_CARE_PROVIDER_SITE_OTHER): Payer: Federal, State, Local not specified - PPO

## 2023-11-25 DIAGNOSIS — E538 Deficiency of other specified B group vitamins: Secondary | ICD-10-CM

## 2023-11-25 MED ORDER — CYANOCOBALAMIN 1000 MCG/ML IJ SOLN
1000.0000 ug | Freq: Once | INTRAMUSCULAR | Status: AC
  Administered 2023-11-25: 1000 ug via INTRAMUSCULAR

## 2023-11-25 NOTE — Progress Notes (Signed)
Pt presented for their vitamin B12 injection. Pt was identified through two identifiers. Pt tolerated shot well in their left  deltoid.  

## 2023-12-18 DIAGNOSIS — K5000 Crohn's disease of small intestine without complications: Secondary | ICD-10-CM | POA: Diagnosis not present

## 2023-12-19 ENCOUNTER — Encounter: Payer: Self-pay | Admitting: Internal Medicine

## 2023-12-19 NOTE — Telephone Encounter (Signed)
 fyi

## 2023-12-23 ENCOUNTER — Ambulatory Visit: Payer: Federal, State, Local not specified - PPO

## 2023-12-24 ENCOUNTER — Ambulatory Visit (INDEPENDENT_AMBULATORY_CARE_PROVIDER_SITE_OTHER): Payer: Federal, State, Local not specified - PPO

## 2023-12-24 DIAGNOSIS — E538 Deficiency of other specified B group vitamins: Secondary | ICD-10-CM

## 2023-12-24 MED ORDER — CYANOCOBALAMIN 1000 MCG/ML IJ SOLN
1000.0000 ug | Freq: Once | INTRAMUSCULAR | Status: AC
  Administered 2023-12-24: 1000 ug via INTRAMUSCULAR

## 2023-12-24 NOTE — Progress Notes (Signed)
 Patient presented for B 12 injection to right deltoid, patient voiced no concerns nor showed any signs of distress during injection.

## 2023-12-26 DIAGNOSIS — D225 Melanocytic nevi of trunk: Secondary | ICD-10-CM | POA: Diagnosis not present

## 2023-12-26 DIAGNOSIS — D2262 Melanocytic nevi of left upper limb, including shoulder: Secondary | ICD-10-CM | POA: Diagnosis not present

## 2023-12-26 DIAGNOSIS — L821 Other seborrheic keratosis: Secondary | ICD-10-CM | POA: Diagnosis not present

## 2023-12-26 DIAGNOSIS — D2261 Melanocytic nevi of right upper limb, including shoulder: Secondary | ICD-10-CM | POA: Diagnosis not present

## 2023-12-26 DIAGNOSIS — L570 Actinic keratosis: Secondary | ICD-10-CM | POA: Diagnosis not present

## 2024-01-07 DIAGNOSIS — M2559 Pain in other specified joint: Secondary | ICD-10-CM | POA: Diagnosis not present

## 2024-01-07 DIAGNOSIS — K5000 Crohn's disease of small intestine without complications: Secondary | ICD-10-CM | POA: Diagnosis not present

## 2024-01-08 ENCOUNTER — Encounter: Payer: Self-pay | Admitting: Internal Medicine

## 2024-01-08 DIAGNOSIS — K50919 Crohn's disease, unspecified, with unspecified complications: Secondary | ICD-10-CM

## 2024-01-08 NOTE — Telephone Encounter (Signed)
ANA added.  Need all future labs.  Thanks

## 2024-01-15 ENCOUNTER — Other Ambulatory Visit (INDEPENDENT_AMBULATORY_CARE_PROVIDER_SITE_OTHER): Payer: Federal, State, Local not specified - PPO

## 2024-01-15 DIAGNOSIS — K5000 Crohn's disease of small intestine without complications: Secondary | ICD-10-CM | POA: Diagnosis not present

## 2024-01-15 DIAGNOSIS — E538 Deficiency of other specified B group vitamins: Secondary | ICD-10-CM | POA: Diagnosis not present

## 2024-01-15 DIAGNOSIS — E7800 Pure hypercholesterolemia, unspecified: Secondary | ICD-10-CM

## 2024-01-15 DIAGNOSIS — E559 Vitamin D deficiency, unspecified: Secondary | ICD-10-CM | POA: Diagnosis not present

## 2024-01-15 DIAGNOSIS — K50919 Crohn's disease, unspecified, with unspecified complications: Secondary | ICD-10-CM

## 2024-01-15 LAB — LIPID PANEL
Cholesterol: 192 mg/dL (ref 0–200)
HDL: 39.5 mg/dL (ref >39.00)
LDL Cholesterol: 94 mg/dL (ref 0–99)
NonHDL: 152.61
Total CHOL/HDL Ratio: 5
Triglycerides: 291 mg/dL — ABNORMAL HIGH (ref 0.0–149.0)
VLDL: 58.2 mg/dL — ABNORMAL HIGH (ref 0.0–40.0)

## 2024-01-15 LAB — CBC WITH DIFFERENTIAL/PLATELET
Basophils Absolute: 0 10*3/uL (ref 0.0–0.1)
Basophils Relative: 0.6 % (ref 0.0–3.0)
Eosinophils Absolute: 0.1 10*3/uL (ref 0.0–0.7)
Eosinophils Relative: 1.3 % (ref 0.0–5.0)
HCT: 38.1 % (ref 36.0–46.0)
Hemoglobin: 12.6 g/dL (ref 12.0–15.0)
Lymphocytes Relative: 26.3 % (ref 12.0–46.0)
Lymphs Abs: 1.6 10*3/uL (ref 0.7–4.0)
MCHC: 33 g/dL (ref 30.0–36.0)
MCV: 86.7 fL (ref 78.0–100.0)
Monocytes Absolute: 0.3 10*3/uL (ref 0.1–1.0)
Monocytes Relative: 5.2 % (ref 3.0–12.0)
Neutro Abs: 4.2 10*3/uL (ref 1.4–7.7)
Neutrophils Relative %: 66.6 % (ref 43.0–77.0)
Platelets: 220 10*3/uL (ref 150.0–400.0)
RBC: 4.4 Mil/uL (ref 3.87–5.11)
RDW: 15.8 % — ABNORMAL HIGH (ref 11.5–15.5)
WBC: 6.3 10*3/uL (ref 4.0–10.5)

## 2024-01-15 LAB — BASIC METABOLIC PANEL
BUN: 14 mg/dL (ref 6–23)
CO2: 26 meq/L (ref 19–32)
Calcium: 9.6 mg/dL (ref 8.4–10.5)
Chloride: 103 meq/L (ref 96–112)
Creatinine, Ser: 0.8 mg/dL (ref 0.40–1.20)
GFR: 78.66 mL/min (ref >60.00)
Glucose, Bld: 87 mg/dL (ref 70–99)
Potassium: 4.2 meq/L (ref 3.5–5.1)
Sodium: 138 meq/L (ref 135–145)

## 2024-01-15 LAB — VITAMIN D 25 HYDROXY (VIT D DEFICIENCY, FRACTURES): VITD: 19.63 ng/mL — ABNORMAL LOW (ref 30.00–100.00)

## 2024-01-15 LAB — HEPATIC FUNCTION PANEL
ALT: 17 U/L (ref 0–35)
AST: 20 U/L (ref 0–37)
Albumin: 4.6 g/dL (ref 3.5–5.2)
Alkaline Phosphatase: 100 U/L (ref 39–117)
Bilirubin, Direct: 0.1 mg/dL (ref 0.0–0.3)
Total Bilirubin: 0.5 mg/dL (ref 0.2–1.2)
Total Protein: 7.8 g/dL (ref 6.0–8.3)

## 2024-01-15 LAB — VITAMIN B12: Vitamin B-12: 411 pg/mL (ref 211–911)

## 2024-01-16 LAB — ANA: Anti Nuclear Antibody (ANA): NEGATIVE

## 2024-01-17 ENCOUNTER — Ambulatory Visit: Payer: Federal, State, Local not specified - PPO | Admitting: Internal Medicine

## 2024-01-17 VITALS — BP 112/68 | HR 78 | Temp 98.0°F | Resp 16 | Ht 66.0 in | Wt 160.4 lb

## 2024-01-17 DIAGNOSIS — E7800 Pure hypercholesterolemia, unspecified: Secondary | ICD-10-CM | POA: Diagnosis not present

## 2024-01-17 DIAGNOSIS — E538 Deficiency of other specified B group vitamins: Secondary | ICD-10-CM

## 2024-01-17 DIAGNOSIS — E559 Vitamin D deficiency, unspecified: Secondary | ICD-10-CM | POA: Diagnosis not present

## 2024-01-17 DIAGNOSIS — F439 Reaction to severe stress, unspecified: Secondary | ICD-10-CM | POA: Diagnosis not present

## 2024-01-17 DIAGNOSIS — K50919 Crohn's disease, unspecified, with unspecified complications: Secondary | ICD-10-CM

## 2024-01-17 MED ORDER — ALPRAZOLAM 0.25 MG PO TABS: ORAL_TABLET | ORAL | 0 refills | Status: DC

## 2024-01-17 NOTE — Patient Instructions (Signed)
 Sublingual B12 1000mcg per day  Vitamin D3 2000 units per day.

## 2024-01-17 NOTE — Progress Notes (Signed)
 Subjective:    Patient ID: Kelly Tanner, female    DOB: [birth year], 63 y.o.   MRN: [id number]  Patient here for  Chief Complaint  Patient presents with   Medical Management of Chronic Issues    HPI Here for a scheduled follow up. Recently diagnosed with crohns. Seeing GI. Being treated with Rizankinumab - started 12/18/23. Initially felt to have fracture of second metatarsal. Placed in cam boot. Overread - no acute fracture. Continued treatment for possible hairline fracture. Doing better. Stays active. No chest pain. Breathing stable. No abdominal pain. Discussed labs. Discussed B12 and vitamin D  levels.    Past Medical History:  Diagnosis Date   Allergy    septra   Basal cell carcinoma    Hyperlipidemia    Past Surgical History:  Procedure Laterality Date   CYST REMOVAL NECK     EYE SURGERY  05/2017   REFRACTIVE SURGERY     SKIN CANCER EXCISION  12/11/1999   basal cell   Family History  Problem Relation Age of Onset   Cancer Mother    Cancer Father    COPD Father    Breast cancer Paternal Grandmother    Lung cancer Other        parent   Colon cancer Other        parent   Social History   Socioeconomic History   Marital status: Married    Spouse name: Not on file   Number of children: 2   Years of education: Not on file   Highest education level: Bachelor's degree (e.g., BA, AB, BS)  Occupational History   Not on file  Tobacco Use   Smoking status: Never   Smokeless tobacco: Never  Substance and Sexual Activity   Alcohol use: Yes   Drug use: No   Sexual activity: Yes    Birth control/protection: Post-menopausal  Other Topics Concern   Not on file  Social History Narrative   Lives at home in [place].    Married with kids   Social Drivers of Health   Financial Resource Strain: Low Risk  (01/15/2024)   Overall Financial Resource Strain (CARDIA)    Difficulty of Paying Living Expenses: Not hard at all  Food Insecurity: No Food Insecurity  (01/15/2024)   Hunger Vital Sign    Worried About Running Out of Food in the Last Year: Never true    Ran Out of Food in the Last Year: Never true  Transportation Needs: No Transportation Needs (01/15/2024)   PRAPARE - Administrator, Civil Service (Medical): No    Lack of Transportation (Non-Medical): No  Physical Activity: Insufficiently Active (01/15/2024)   Exercise Vital Sign    Days of Exercise per Week: 3 days    Minutes of Exercise per Session: 30 min  Stress: Stress Concern Present (01/15/2024)   [employer] of Occupational Health - Occupational Stress Questionnaire    Feeling of Stress : To some extent  Social Connections: Socially Integrated (01/15/2024)   Social Connection and Isolation Panel [NHANES]    Frequency of Communication with Friends and Family: More than three times a week    Frequency of Social Gatherings with Friends and Family: Twice a week    Attends Religious Services: More than 4 times per year    Active Member of [employer] or Organizations: Yes    Attends Engineer, Structural: More than 4 times per year    Marital Status: Married  Review of Systems  Constitutional:  Negative for appetite change and unexpected weight change.  HENT:  Negative for congestion and sinus pressure.   Respiratory:  Negative for cough, chest tightness and shortness of breath.   Cardiovascular:  Negative for chest pain and palpitations.  Gastrointestinal:  Negative for abdominal pain, diarrhea, nausea and vomiting.  Genitourinary:  Negative for difficulty urinating and dysuria.  Musculoskeletal:  Negative for joint swelling and myalgias.  Skin:  Negative for color change and rash.  Neurological:  Negative for dizziness and headaches.  Psychiatric/Behavioral:  Negative for agitation and dysphoric mood.        Objective:     BP 112/68   Pulse 78   Temp 98 F (36.7 C)   Resp 16   Ht 5' 6 (1.676 m)   Wt 160 lb 6.4 oz (72.8 kg)   LMP 11/02/2010   SpO2  99%   BMI 25.89 kg/m  Wt Readings from Last 3 Encounters:  01/17/24 160 lb 6.4 oz (72.8 kg)  10/29/23 157 lb (71.2 kg)  10/17/23 157 lb (71.2 kg)    Physical Exam Vitals reviewed.  Constitutional:      General: She is not in acute distress.    Appearance: Normal appearance.  HENT:     Head: Normocephalic and atraumatic.     Right Ear: External ear normal.     Left Ear: External ear normal.     Mouth/Throat:     Pharynx: No oropharyngeal exudate or posterior oropharyngeal erythema.  Eyes:     General: No scleral icterus.       Right eye: No discharge.        Left eye: No discharge.     Conjunctiva/sclera: Conjunctivae normal.  Neck:     Thyroid : No thyromegaly.  Cardiovascular:     Rate and Rhythm: Normal rate and regular rhythm.  Pulmonary:     Effort: No respiratory distress.     Breath sounds: Normal breath sounds. No wheezing.  Abdominal:     General: Bowel sounds are normal.     Palpations: Abdomen is soft.     Tenderness: There is no abdominal tenderness.  Musculoskeletal:        General: No swelling or tenderness.     Cervical back: Neck supple. No tenderness.  Lymphadenopathy:     Cervical: No cervical adenopathy.  Skin:    Findings: No erythema or rash.  Neurological:     Mental Status: She is alert.  Psychiatric:        Mood and Affect: Mood normal.        Behavior: Behavior normal.         Outpatient Encounter Medications as of 01/17/2024  Medication Sig   cholecalciferol (VITAMIN D3) 25 MCG (1000 UNIT) tablet Take 1,000 Units by mouth daily.   ALPRAZolam  (XANAX ) 0.25 MG tablet TAKE ONE TABLET BY MOUTH ONCE DAILY AS NEEDED   cyanocobalamin  (VITAMIN B12) 1000 MCG/ML injection Inject 1,000 mcg into the muscle every 30 (thirty) days.   estradiol  (ESTRACE ) 0.1 MG/GM vaginal cream As directed.   famotidine (PEPCID) 20 MG tablet Take by mouth as needed.   mupirocin  ointment (BACTROBAN ) 2 % Apply 1 Application topically 2 (two) times daily.    [DISCONTINUED] ALPRAZolam  (XANAX ) 0.25 MG tablet TAKE ONE TABLET BY MOUTH ONCE DAILY AS NEEDED   No facility-administered encounter medications on file as of 01/17/2024.     Lab Results  Component Value Date   WBC 6.3 01/15/2024   HGB  12.6 01/15/2024   HCT 38.1 01/15/2024   PLT 220.0 01/15/2024   GLUCOSE 87 01/15/2024   CHOL 192 01/15/2024   TRIG 291.0 (H) 01/15/2024   HDL 39.50 01/15/2024   LDLDIRECT 85.0 07/11/2023   LDLCALC 94 01/15/2024   ALT 17 01/15/2024   AST 20 01/15/2024   NA 138 01/15/2024   K 4.2 01/15/2024   CL 103 01/15/2024   CREATININE 0.80 01/15/2024   BUN 14 01/15/2024   CO2 26 01/15/2024   TSH 2.47 07/11/2023    MM 3D DIAGNOSTIC MAMMOGRAM UNILATERAL LEFT BREAST Result Date: 11/04/2023 CLINICAL DATA:  Recalled from screening for left breast mass. EXAM: DIGITAL DIAGNOSTIC UNILATERAL LEFT MAMMOGRAM WITH TOMOSYNTHESIS AND CAD; ULTRASOUND LEFT BREAST LIMITED TECHNIQUE: Left digital diagnostic mammography and breast tomosynthesis was performed. The images were evaluated with computer-aided detection. ; Targeted ultrasound examination of the left breast was performed. COMPARISON:  Previous exam(s). ACR Breast Density Category c: The breasts are heterogeneously dense, which may obscure small masses. FINDINGS: Within the anterior inferior left breast there is a persistent low-density oval mass further evaluated with spot compression views. Targeted ultrasound is performed, showing a 0.8 x 0.3 x 0.7 cm cyst left breast 6 o'clock position 4 cm from the nipple. IMPRESSION: Left breast cyst.  No mammographic evidence for malignancy. RECOMMENDATION: Screening mammogram in one year.(Code:SM-B-01Y) I have discussed the findings and recommendations with the patient. If applicable, a reminder letter will be sent to the patient regarding the next appointment. BI-RADS CATEGORY  2: Benign. Electronically Signed   By: Bard Moats M.D.   On: 11/04/2023 14:09   US  LIMITED ULTRASOUND INCLUDING  AXILLA LEFT BREAST  Result Date: 11/04/2023 CLINICAL DATA:  Recalled from screening for left breast mass. EXAM: DIGITAL DIAGNOSTIC UNILATERAL LEFT MAMMOGRAM WITH TOMOSYNTHESIS AND CAD; ULTRASOUND LEFT BREAST LIMITED TECHNIQUE: Left digital diagnostic mammography and breast tomosynthesis was performed. The images were evaluated with computer-aided detection. ; Targeted ultrasound examination of the left breast was performed. COMPARISON:  Previous exam(s). ACR Breast Density Category c: The breasts are heterogeneously dense, which may obscure small masses. FINDINGS: Within the anterior inferior left breast there is a persistent low-density oval mass further evaluated with spot compression views. Targeted ultrasound is performed, showing a 0.8 x 0.3 x 0.7 cm cyst left breast 6 o'clock position 4 cm from the nipple. IMPRESSION: Left breast cyst.  No mammographic evidence for malignancy. RECOMMENDATION: Screening mammogram in one year.(Code:SM-B-01Y) I have discussed the findings and recommendations with the patient. If applicable, a reminder letter will be sent to the patient regarding the next appointment. BI-RADS CATEGORY  2: Benign. Electronically Signed   By: Bard Moats M.D.   On: 11/04/2023 14:09       Assessment & Plan:  Hypercholesterolemia Assessment & Plan: The 10-year ASCVD risk score (Arnett DK, et al., 2019) is: 3.7%   Values used to calculate the score:     Age: 61 years     Sex: Female     Is Non-Hispanic African American: No     Diabetic: No     Tobacco smoker: No     Systolic Blood Pressure: 112 mmHg     Is BP treated: No     HDL Cholesterol: 39.5 mg/dL     Total Cholesterol: 192 mg/dL  Low cholesterol diet and exercise.  Follow  lipid panel.   Orders: -     Lipid panel; Future -     Hepatic function panel; Future -     Basic  metabolic panel; Future -     TSH; Future  Stress Assessment & Plan: Increased stress.  Discussed.  Overall appears to be doing better. Notify me if  feels needs something more.   Orders: -     ALPRAZolam ; TAKE ONE TABLET BY MOUTH ONCE DAILY AS NEEDED  Dispense: 30 tablet; Refill: 0  Vitamin D  deficiency Assessment & Plan: Increase vitamin D3 supplements to 2000 units q day. Follow.   Orders: -     VITAMIN D  25 Hydroxy (Vit-D Deficiency, Fractures); Future  B12 deficiency Assessment & Plan: Will stop B12 injections and start sublingual B12 1000mcg q day.   Orders: -     Vitamin B12; Future  Crohn's disease with complication, unspecified gastrointestinal tract location [facility]) Assessment & Plan: Newly diagnosed with ileal involvement. Has started treatment - rizankinumab. Tolerating. Continue f/u with GI.       Allena Hamilton, MD

## 2024-01-19 ENCOUNTER — Encounter: Payer: Self-pay | Admitting: Internal Medicine

## 2024-01-19 NOTE — Assessment & Plan Note (Signed)
 Will stop B12 injections and start sublingual B12 1000mcg q day.

## 2024-01-19 NOTE — Assessment & Plan Note (Signed)
 Newly diagnosed with ileal involvement. Has started treatment - rizankinumab. Tolerating. Continue f/u with GI.

## 2024-01-19 NOTE — Assessment & Plan Note (Signed)
 The 10-year ASCVD risk score (Arnett DK, et al., 2019) is: 3.7%   Values used to calculate the score:     Age: 63 years     Sex: Female     Is Non-Hispanic African American: No     Diabetic: No     Tobacco smoker: No     Systolic Blood Pressure: 112 mmHg     Is BP treated: No     HDL Cholesterol: 39.5 mg/dL     Total Cholesterol: 192 mg/dL  Low cholesterol diet and exercise.  Follow  lipid panel.

## 2024-01-19 NOTE — Assessment & Plan Note (Signed)
 Increased stress.  Discussed.  Overall appears to be doing better. Notify me if feels needs something more.

## 2024-01-19 NOTE — Assessment & Plan Note (Signed)
 Increase vitamin D3 supplements to 2000 units q day. Follow.

## 2024-01-20 ENCOUNTER — Ambulatory Visit (INDEPENDENT_AMBULATORY_CARE_PROVIDER_SITE_OTHER): Payer: Federal, State, Local not specified - PPO

## 2024-02-12 DIAGNOSIS — K5000 Crohn's disease of small intestine without complications: Secondary | ICD-10-CM | POA: Diagnosis not present

## 2024-02-27 ENCOUNTER — Ambulatory Visit: Payer: Self-pay | Admitting: Internal Medicine

## 2024-02-27 ENCOUNTER — Encounter: Payer: Self-pay | Admitting: Internal Medicine

## 2024-02-27 NOTE — Telephone Encounter (Signed)
  Chief Complaint: urinary symptoms Symptoms: blood in urine, increased frequency, pain with urination Frequency: began yesterday Pertinent Negatives: Patient denies flank pain, fever, NVD Disposition: [] ED /[x] Urgent Care (no appt availability in office) / [] Appointment(In office/virtual)/ []  [facility]/ [] Home Care/ [] Refused Recommended Disposition /[] [facility]/ []  Follow-up with PCP Additional Notes: Patient calls reporting urinary symptoms that began yesterday. Patient states she has increased fluid intake but this morning she noticed blood in her urine. Per protocol, patient to be evaluated within 24 hours. First available appointment with PCP or any provider in clinic outside of guideline. Patient requests this RN contact [facility] to see if she can just do a UA, CAL states patient will need to make an appt for tmw or be seen in UC. Patient states she is going to UC for eval, cannot wait until tomorrow. Care advice reviewed, patient verbalized understanding and denies further questions at this time. Alerting PCP for review.    Copied from CRM [id number]. Topic: Clinical - Red Word Triage >> Feb 27, 2024  8:39 AM Pascal Lux wrote: Red Word that prompted transfer to Nurse Triage: Patient stated may have a UTI, pain blood in urine. Reason for Disposition  Pain or burning with passing urine  Answer Assessment - Initial Assessment Questions 1. COLOR of URINE: "Describe the color of the urine."  (e.g., tea-colored, pink, red, bloody) "Do you have blood clots in your urine?" (e.g., none, pea, grape, small coin)      2. ONSET: "When did the bleeding start?"      1 day 3. EPISODES: "How many times has there been blood in the urine?" or "How many times today?"     A few times this morning 4. PAIN with URINATION: "Is there any pain with passing your urine?" If Yes, ask: "How bad is the pain?"  (Scale 1-10; or mild, moderate, severe)    - MILD: Complains slightly about urination  hurting.    - MODERATE: Interferes with normal activities.      - SEVERE: Excruciating, unwilling or unable to urinate because of the pain.      Only when urinating, 2/10 5. FEVER: "Do you have a fever?" If Yes, ask: "What is your temperature, how was it measured, and when did it start?"     Denies 6. ASSOCIATED SYMPTOMS: "Are you passing urine more frequently than usual?"     Increased frequency 7. OTHER SYMPTOMS: "Do you have any other symptoms?" (e.g., back/flank pain, abdomen pain, vomiting)     Urgency, burning  Protocols used: Urine - Blood In-A-AH

## 2024-02-27 NOTE — Telephone Encounter (Signed)
 Pt scheduled with Dr. Nicki Reaper tomorrow

## 2024-02-27 NOTE — Telephone Encounter (Signed)
 Pt scheduled with Dr Lorin Picket

## 2024-02-28 ENCOUNTER — Ambulatory Visit: Admitting: Internal Medicine

## 2024-02-28 ENCOUNTER — Encounter: Payer: Self-pay | Admitting: Internal Medicine

## 2024-02-28 VITALS — BP 110/70 | HR 87 | Temp 98.0°F | Resp 16 | Ht 66.0 in | Wt 159.4 lb

## 2024-02-28 DIAGNOSIS — R319 Hematuria, unspecified: Secondary | ICD-10-CM | POA: Diagnosis not present

## 2024-02-28 DIAGNOSIS — K50919 Crohn's disease, unspecified, with unspecified complications: Secondary | ICD-10-CM

## 2024-02-28 DIAGNOSIS — R300 Dysuria: Secondary | ICD-10-CM

## 2024-02-28 LAB — URINALYSIS, MICROSCOPIC ONLY

## 2024-02-28 LAB — POCT URINALYSIS DIPSTICK
Bilirubin, UA: NEGATIVE
Glucose, UA: NEGATIVE
Ketones, UA: NEGATIVE
Nitrite, UA: NEGATIVE
Protein, UA: POSITIVE — AB
Spec Grav, UA: 1.02 (ref 1.010–1.025)
Urobilinogen, UA: 0.2 U/dL
pH, UA: 7 (ref 5.0–8.0)

## 2024-02-28 NOTE — Progress Notes (Signed)
 Subjective:    Patient ID: Kelly Tanner, female    DOB: [birth year], 63 y.o.   MRN: [id number]  Patient here for  Chief Complaint  Patient presents with   Hematuria   Dysuria    HPI Here for work in appt. Work in for possible UTI. Noticed increased dysuria and hematuria. Was at [facility] last week. Noticed Sunday 02/23/24 - dysuria. Traveled back - increased water intake. Symptoms initially improved. Yesterday am - woke - increased frequency and noticed blood in the urine. Pink urine. Some urgency. Not noticing blood now. No vaginal discharge. No diarrhea. No abdominal pain or back pain. No fever.    Past Medical History:  Diagnosis Date   Allergy    septra   Basal cell carcinoma    Hyperlipidemia    Past Surgical History:  Procedure Laterality Date   CYST REMOVAL NECK     EYE SURGERY  05/2017   REFRACTIVE SURGERY     SKIN CANCER EXCISION  12/11/1999   basal cell   Family History  Problem Relation Age of Onset   Cancer Mother    Cancer Father    COPD Father    Breast cancer Paternal Grandmother    Lung cancer Other        parent   Colon cancer Other        parent   Social History   Socioeconomic History   Marital status: Married    Spouse name: Not on file   Number of children: 2   Years of education: Not on file   Highest education level: Bachelor's degree (e.g., BA, AB, BS)  Occupational History   Not on file  Tobacco Use   Smoking status: Never   Smokeless tobacco: Never  Substance and Sexual Activity   Alcohol use: Yes   Drug use: No   Sexual activity: Yes    Birth control/protection: Post-menopausal  Other Topics Concern   Not on file  Social History Narrative   Lives at home in [place].    Married with kids   Social Drivers of Health   Financial Resource Strain: Low Risk  (01/15/2024)   Overall Financial Resource Strain (CARDIA)    Difficulty of Paying Living Expenses: Not hard at all  Food Insecurity: No Food Insecurity (01/15/2024)    Hunger Vital Sign    Worried About Running Out of Food in the Last Year: Never true    Ran Out of Food in the Last Year: Never true  Transportation Needs: No Transportation Needs (01/15/2024)   PRAPARE - Administrator, Civil Service (Medical): No    Lack of Transportation (Non-Medical): No  Physical Activity: Insufficiently Active (01/15/2024)   Exercise Vital Sign    Days of Exercise per Week: 3 days    Minutes of Exercise per Session: 30 min  Stress: Stress Concern Present (01/15/2024)   [employer] of Occupational Health - Occupational Stress Questionnaire    Feeling of Stress : To some extent  Social Connections: Socially Integrated (01/15/2024)   Social Connection and Isolation Panel [NHANES]    Frequency of Communication with Friends and Family: More than three times a week    Frequency of Social Gatherings with Friends and Family: Twice a week    Attends Religious Services: More than 4 times per year    Active Member of [employer] or Organizations: Yes    Attends Banker Meetings: More than 4 times per year    Marital Status:  Married     Review of Systems  Constitutional:  Negative for appetite change and fever.  HENT:  Negative for congestion and sinus pressure.   Respiratory:  Negative for cough, chest tightness and shortness of breath.   Cardiovascular:  Negative for chest pain, palpitations and leg swelling.  Gastrointestinal:  Negative for abdominal pain, diarrhea, nausea and vomiting.  Genitourinary:  Positive for dysuria and frequency. Negative for vaginal discharge.  Musculoskeletal:  Negative for back pain and myalgias.  Skin:  Negative for color change and rash.  Neurological:  Negative for dizziness and headaches.  Psychiatric/Behavioral:  Negative for agitation and dysphoric mood.        Objective:     BP 110/70   Pulse 87   Temp 98 F (36.7 C)   Resp 16   Ht 5\' 6"  (1.676 m)   Wt 159 lb 6.4 oz (72.3 kg)   LMP 11/02/2010   SpO2 99%    BMI 25.73 kg/m  Wt Readings from Last 3 Encounters:  02/28/24 159 lb 6.4 oz (72.3 kg)  01/17/24 160 lb 6.4 oz (72.8 kg)  10/29/23 157 lb (71.2 kg)    Physical Exam Vitals reviewed.  Constitutional:      General: She is not in acute distress.    Appearance: Normal appearance.  HENT:     Head: Normocephalic and atraumatic.     Right Ear: External ear normal.     Left Ear: External ear normal.  Eyes:     General: No scleral icterus.       Right eye: No discharge.        Left eye: No discharge.     Conjunctiva/sclera: Conjunctivae normal.  Neck:     Thyroid: No thyromegaly.  Cardiovascular:     Rate and Rhythm: Normal rate and regular rhythm.  Pulmonary:     Effort: No respiratory distress.     Breath sounds: Normal breath sounds. No wheezing.  Abdominal:     General: Bowel sounds are normal.     Palpations: Abdomen is soft.     Tenderness: There is no abdominal tenderness.  Musculoskeletal:        General: No swelling or tenderness.     Cervical back: Neck supple. No tenderness.     Comments: No CVA tenderness  Lymphadenopathy:     Cervical: No cervical adenopathy.  Skin:    Findings: No erythema or rash.  Neurological:     Mental Status: She is alert.  Psychiatric:        Mood and Affect: Mood normal.        Behavior: Behavior normal.         Outpatient Encounter Medications as of 02/28/2024  Medication Sig   SKYRIZI 180 MG/1.2ML SOCT Inject 180 mg into the skin every 8 (eight) weeks.   ALPRAZolam (XANAX) 0.25 MG tablet TAKE ONE TABLET BY MOUTH ONCE DAILY AS NEEDED   cholecalciferol (VITAMIN D3) 25 MCG (1000 UNIT) tablet Take 1,000 Units by mouth daily.   cyanocobalamin (VITAMIN B12) 1000 MCG/ML injection Inject 1,000 mcg into the muscle every 30 (thirty) days.   estradiol (ESTRACE) 0.1 MG/GM vaginal cream As directed.   famotidine (PEPCID) 20 MG tablet Take by mouth as needed.   mupirocin ointment (BACTROBAN) 2 % Apply 1 Application topically 2 (two) times  daily.   No facility-administered encounter medications on file as of 02/28/2024.     Lab Results  Component Value Date   WBC 6.3 01/15/2024   HGB  12.6 01/15/2024   HCT 38.1 01/15/2024   PLT 220.0 01/15/2024   GLUCOSE 87 01/15/2024   CHOL 192 01/15/2024   TRIG 291.0 (H) 01/15/2024   HDL 39.50 01/15/2024   LDLDIRECT 85.0 07/11/2023   LDLCALC 94 01/15/2024   ALT 17 01/15/2024   AST 20 01/15/2024   NA 138 01/15/2024   K 4.2 01/15/2024   CL 103 01/15/2024   CREATININE 0.80 01/15/2024   BUN 14 01/15/2024   CO2 26 01/15/2024   TSH 2.47 07/11/2023    MM 3D DIAGNOSTIC MAMMOGRAM UNILATERAL LEFT BREAST Result Date: 11/04/2023 CLINICAL DATA:  Recalled from screening for left breast mass. EXAM: DIGITAL DIAGNOSTIC UNILATERAL LEFT MAMMOGRAM WITH TOMOSYNTHESIS AND CAD; ULTRASOUND LEFT BREAST LIMITED TECHNIQUE: Left digital diagnostic mammography and breast tomosynthesis was performed. The images were evaluated with computer-aided detection. ; Targeted ultrasound examination of the left breast was performed. COMPARISON:  Previous exam(s). ACR Breast Density Category c: The breasts are heterogeneously dense, which may obscure small masses. FINDINGS: Within the anterior inferior left breast there is a persistent low-density oval mass further evaluated with spot compression views. Targeted ultrasound is performed, showing a 0.8 x 0.3 x 0.7 cm cyst left breast 6 o'clock position 4 cm from the nipple. IMPRESSION: Left breast cyst.  No mammographic evidence for malignancy. RECOMMENDATION: Screening mammogram in one year.(Code:SM-B-01Y) I have discussed the findings and recommendations with the patient. If applicable, a reminder letter will be sent to the patient regarding the next appointment. BI-RADS CATEGORY  2: Benign. Electronically Signed   By: Annia Belt M.D.   On: 11/04/2023 14:09   [place] LIMITED ULTRASOUND INCLUDING AXILLA LEFT BREAST  Result Date: 11/04/2023 CLINICAL DATA:  Recalled from screening  for left breast mass. EXAM: DIGITAL DIAGNOSTIC UNILATERAL LEFT MAMMOGRAM WITH TOMOSYNTHESIS AND CAD; ULTRASOUND LEFT BREAST LIMITED TECHNIQUE: Left digital diagnostic mammography and breast tomosynthesis was performed. The images were evaluated with computer-aided detection. ; Targeted ultrasound examination of the left breast was performed. COMPARISON:  Previous exam(s). ACR Breast Density Category c: The breasts are heterogeneously dense, which may obscure small masses. FINDINGS: Within the anterior inferior left breast there is a persistent low-density oval mass further evaluated with spot compression views. Targeted ultrasound is performed, showing a 0.8 x 0.3 x 0.7 cm cyst left breast 6 o'clock position 4 cm from the nipple. IMPRESSION: Left breast cyst.  No mammographic evidence for malignancy. RECOMMENDATION: Screening mammogram in one year.(Code:SM-B-01Y) I have discussed the findings and recommendations with the patient. If applicable, a reminder letter will be sent to the patient regarding the next appointment. BI-RADS CATEGORY  2: Benign. Electronically Signed   By: Annia Belt M.D.   On: 11/04/2023 14:09       Assessment & Plan:  Hematuria, unspecified type Assessment & Plan: Presented today with reports of dysuria, urgency and gross hematuria. She has started increasing her water intake since this started. Symptoms have improved. She will take AZO if needed. Given her history of c.diff infection and given improvement in symptoms, will hold on abx until can review culture. She is comfortable with this plan. She has had previous w/up for hematuria. Around 2013 - She underwent evaluation in [place].  This included a CT scan that, per report, revealed no evidence of renal mass, stone, or obstruction. The bladder demonstrated no significant wall thickening or filling defects. No other significant abnormalities were appreciated. She also underwent cystoscopy. This demonstrated no significant  abnormalities. Has not had issues, until now with hematuria. Plan further  w/up once review micro and culture.   Orders: -     POCT urinalysis dipstick -     Urine Microscopic -     Urine Culture  Dysuria Assessment & Plan: Symptoms as outlined. Feeling some better with increased water intake. No vaginal discharge.    Crohn's disease with complication, unspecified gastrointestinal tract location [facility]) Assessment & Plan: Newly diagnosed with ileal involvement. Has started treatment - rizankinumab. Tolerating. Continue f/u with GI. Doing well with the medication. Follow.       D[place], MD

## 2024-02-28 NOTE — Assessment & Plan Note (Signed)
 Symptoms as outlined. Feeling some better with increased water intake. No vaginal discharge.

## 2024-02-29 ENCOUNTER — Encounter: Payer: Self-pay | Admitting: Internal Medicine

## 2024-02-29 NOTE — Assessment & Plan Note (Signed)
 Presented today with reports of dysuria, urgency and gross hematuria. She has started increasing her water intake since this started. Symptoms have improved. She will take AZO if needed. Given her history of c.diff infection and given improvement in symptoms, will hold on abx until can review culture. She is comfortable with this plan. She has had previous w/up for hematuria. Around 2013 - She underwent evaluation in [place].  This included a CT scan that, per report, revealed no evidence of renal mass, stone, or obstruction. The bladder demonstrated no significant wall thickening or filling defects. No other significant abnormalities were appreciated. She also underwent cystoscopy. This demonstrated no significant abnormalities. Has not had issues, until now with hematuria. Plan further w/up once review micro and culture.

## 2024-02-29 NOTE — Assessment & Plan Note (Signed)
 Newly diagnosed with ileal involvement. Has started treatment - rizankinumab. Tolerating. Continue f/u with GI. Doing well with the medication. Follow.

## 2024-03-01 LAB — URINE CULTURE
MICRO NUMBER:: 16232056
SPECIMEN QUALITY:: ADEQUATE

## 2024-03-02 ENCOUNTER — Other Ambulatory Visit: Payer: Self-pay

## 2024-03-02 ENCOUNTER — Telehealth: Payer: Self-pay

## 2024-03-02 ENCOUNTER — Encounter: Payer: Self-pay | Admitting: Internal Medicine

## 2024-03-02 MED ORDER — NITROFURANTOIN MONOHYD MACRO 100 MG PO CAPS: 100.0000 mg | ORAL_CAPSULE | Freq: Two times a day (BID) | ORAL | 0 refills | Status: DC

## 2024-03-02 NOTE — Telephone Encounter (Signed)
 Patient calling about culture results. Culture was just resulted.

## 2024-03-02 NOTE — Telephone Encounter (Signed)
SEE RESULT NOTE

## 2024-03-02 NOTE — Telephone Encounter (Signed)
 See result note for orders. Positive urine culture.

## 2024-03-02 NOTE — Telephone Encounter (Signed)
 Copied from CRM [id number]. Topic: Clinical - Medical Advice >> Mar 02, 2024 10:32 AM Efraim Kaufmann C wrote: Reason for CRM: patient just saw Doctor on Friday and early tests indicated UTI but they wanted to wait on urine culture before getting on antibiotics, however patient definitely is thinking this is a UTI and was hoping someone could take a look at those cultures to be sure and get her started on an antibiotic. She said if you need to give her a call to talk it over, please do. Thank you.

## 2024-03-23 DIAGNOSIS — K5000 Crohn's disease of small intestine without complications: Secondary | ICD-10-CM | POA: Diagnosis not present

## 2024-04-20 DIAGNOSIS — K5000 Crohn's disease of small intestine without complications: Secondary | ICD-10-CM | POA: Diagnosis not present

## 2024-07-02 DIAGNOSIS — D225 Melanocytic nevi of trunk: Secondary | ICD-10-CM | POA: Diagnosis not present

## 2024-07-02 DIAGNOSIS — D2261 Melanocytic nevi of right upper limb, including shoulder: Secondary | ICD-10-CM | POA: Diagnosis not present

## 2024-07-02 DIAGNOSIS — Z85828 Personal history of other malignant neoplasm of skin: Secondary | ICD-10-CM | POA: Diagnosis not present

## 2024-07-02 DIAGNOSIS — D2262 Melanocytic nevi of left upper limb, including shoulder: Secondary | ICD-10-CM | POA: Diagnosis not present

## 2024-07-16 ENCOUNTER — Other Ambulatory Visit (INDEPENDENT_AMBULATORY_CARE_PROVIDER_SITE_OTHER): Payer: Federal, State, Local not specified - PPO

## 2024-07-16 DIAGNOSIS — E559 Vitamin D deficiency, unspecified: Secondary | ICD-10-CM

## 2024-07-16 DIAGNOSIS — E538 Deficiency of other specified B group vitamins: Secondary | ICD-10-CM | POA: Diagnosis not present

## 2024-07-16 DIAGNOSIS — E7800 Pure hypercholesterolemia, unspecified: Secondary | ICD-10-CM

## 2024-07-16 LAB — BASIC METABOLIC PANEL WITH GFR
BUN: 14 mg/dL (ref 6–23)
CO2: 25 meq/L (ref 19–32)
Calcium: 9.9 mg/dL (ref 8.4–10.5)
Chloride: 102 meq/L (ref 96–112)
Creatinine, Ser: 0.86 mg/dL (ref 0.40–1.20)
GFR: 71.87 mL/min (ref >60.00)
Glucose, Bld: 92 mg/dL (ref 70–99)
Potassium: 4.6 meq/L (ref 3.5–5.1)
Sodium: 138 meq/L (ref 135–145)

## 2024-07-16 LAB — LIPID PANEL
Cholesterol: 194 mg/dL (ref 0–200)
HDL: 34.4 mg/dL — ABNORMAL LOW (ref >39.00)
NonHDL: 159.95
Total CHOL/HDL Ratio: 6
Triglycerides: 538 mg/dL — ABNORMAL HIGH (ref 0.0–149.0)
VLDL: 107.6 mg/dL — ABNORMAL HIGH (ref 0.0–40.0)

## 2024-07-16 LAB — VITAMIN B12: Vitamin B-12: 496 pg/mL (ref 211–911)

## 2024-07-16 LAB — HEPATIC FUNCTION PANEL
ALT: 15 U/L (ref 0–35)
AST: 17 U/L (ref 0–37)
Albumin: 4.6 g/dL (ref 3.5–5.2)
Alkaline Phosphatase: 95 U/L (ref 39–117)
Bilirubin, Direct: 0.1 mg/dL (ref 0.0–0.3)
Total Bilirubin: 0.5 mg/dL (ref 0.2–1.2)
Total Protein: 7.7 g/dL (ref 6.0–8.3)

## 2024-07-16 LAB — LDL CHOLESTEROL, DIRECT: Direct LDL: 71 mg/dL

## 2024-07-16 LAB — VITAMIN D 25 HYDROXY (VIT D DEFICIENCY, FRACTURES): VITD: 35.68 ng/mL (ref 30.00–100.00)

## 2024-07-16 LAB — TSH: TSH: 2.08 u[IU]/mL (ref 0.35–5.50)

## 2024-07-17 ENCOUNTER — Ambulatory Visit: Payer: Self-pay | Admitting: Internal Medicine

## 2024-07-20 ENCOUNTER — Encounter: Payer: Self-pay | Admitting: Internal Medicine

## 2024-07-20 ENCOUNTER — Ambulatory Visit: Payer: Federal, State, Local not specified - PPO | Admitting: Internal Medicine

## 2024-07-20 VITALS — BP 136/82 | HR 91 | Temp 98.9°F | Ht 66.0 in | Wt 158.4 lb

## 2024-07-20 DIAGNOSIS — K50919 Crohn's disease, unspecified, with unspecified complications: Secondary | ICD-10-CM

## 2024-07-20 DIAGNOSIS — E7800 Pure hypercholesterolemia, unspecified: Secondary | ICD-10-CM | POA: Diagnosis not present

## 2024-07-20 DIAGNOSIS — R319 Hematuria, unspecified: Secondary | ICD-10-CM | POA: Diagnosis not present

## 2024-07-20 DIAGNOSIS — F439 Reaction to severe stress, unspecified: Secondary | ICD-10-CM

## 2024-07-20 DIAGNOSIS — Z0000 Encounter for general adult medical examination without abnormal findings: Secondary | ICD-10-CM | POA: Diagnosis not present

## 2024-07-20 DIAGNOSIS — M25522 Pain in left elbow: Secondary | ICD-10-CM

## 2024-07-20 DIAGNOSIS — Z1231 Encounter for screening mammogram for malignant neoplasm of breast: Secondary | ICD-10-CM | POA: Diagnosis not present

## 2024-07-20 LAB — URINALYSIS, ROUTINE W REFLEX MICROSCOPIC
Bilirubin Urine: NEGATIVE
Hgb urine dipstick: NEGATIVE
Ketones, ur: NEGATIVE
Leukocytes,Ua: NEGATIVE
Nitrite: NEGATIVE
Specific Gravity, Urine: 1.01 (ref 1.000–1.030)
Total Protein, Urine: NEGATIVE
Urine Glucose: NEGATIVE
Urobilinogen, UA: 0.2 (ref 0.0–1.0)
pH: 7 (ref 5.0–8.0)

## 2024-07-20 MED ORDER — ESTRADIOL 0.1 MG/GM VA CREA: TOPICAL_CREAM | VAGINAL | 1 refills | Status: AC

## 2024-07-20 MED ORDER — ROSUVASTATIN CALCIUM 5 MG PO TABS: 5.0000 mg | ORAL_TABLET | Freq: Every day | ORAL | 2 refills | Status: DC

## 2024-07-20 MED ORDER — ALPRAZOLAM 0.25 MG PO TABS: ORAL_TABLET | ORAL | 0 refills | Status: AC

## 2024-07-20 NOTE — Assessment & Plan Note (Addendum)
 Physical today 07/20/24.  Colonoscopy 01/2018 - recommended f/u in 5 years.  F/u colonoscopy 08/2023. Mammogram 10/31/23 -  Birads 0. Follow up left breast mammogram/ultrasound 11/04/23 - Birads II. Recommended annual screening mammogram.

## 2024-07-20 NOTE — Progress Notes (Signed)
 Subjective:    Patient ID: Kelly Tanner, female    DOB: April 08, 1961, 63 y.o.   MRN: [id number]  Patient here for a physical exam.   HPI Here for a physical exam. Had f/u with GI 03/23/24 - f/u Crohns on rizankinumab. Doing better regarding GI issues. Recommended f/u calprotectin. Follow up calprotectin 04/20/24 - 46 (compared to 1100 09/17/23). Recently evaluated with concerns regarding UTI. Urine - increased rbc's. Needs f/u urinalysis. She has  been walking. Watching diet. Has decreased wine intake. Discussed labs. Discussed elevated triglycerides. Is concerned that rizankinumab is causing the elevated triglycerides. Plans to discuss with GI. Disucssed diet and exercise. Discussed genetics contributing. Breathing stable. Is doing better regarding her GI issues. Some left elbow pain - appears to be c/w tendinitis.     Past Medical History:  Diagnosis Date   Allergy    septra   Basal cell carcinoma    Hyperlipidemia    Past Surgical History:  Procedure Laterality Date   CYST REMOVAL NECK     EYE SURGERY  05/2017   REFRACTIVE SURGERY     SKIN CANCER EXCISION  12/11/1999   basal cell   Family History  Problem Relation Age of Onset   Cancer Mother    Cancer Father    COPD Father    Breast cancer Paternal Grandmother    Lung cancer Other        parent   Colon cancer Other        parent   Social History   Socioeconomic History   Marital status: Married    Spouse name: Not on file   Number of children: 2   Years of education: Not on file   Highest education level: Bachelor's degree (e.g., BA, AB, BS)  Occupational History   Not on file  Tobacco Use   Smoking status: Never   Smokeless tobacco: Never  Substance and Sexual Activity   Alcohol use: Yes   Drug use: No   Sexual activity: Yes    Birth control/protection: Post-menopausal  Other Topics Concern   Not on file  Social History Narrative   Lives at home in [place].    Married with kids   Social Drivers of  Health   Financial Resource Strain: Low Risk  (07/17/2024)   Overall Financial Resource Strain (CARDIA)    Difficulty of Paying Living Expenses: Not hard at all  Food Insecurity: No Food Insecurity (07/17/2024)   Hunger Vital Sign    Worried About Running Out of Food in the Last Year: Never true    Ran Out of Food in the Last Year: Never true  Transportation Needs: No Transportation Needs (07/17/2024)   PRAPARE - Administrator, Civil Service (Medical): No    Lack of Transportation (Non-Medical): No  Physical Activity: Sufficiently Active (07/17/2024)   Exercise Vital Sign    Days of Exercise per Week: 5 days    Minutes of Exercise per Session: 40 min  Stress: Stress Concern Present (07/17/2024)   [employer] of Occupational Health - Occupational Stress Questionnaire    Feeling of Stress: To some extent  Social Connections: Socially Integrated (07/17/2024)   Social Connection and Isolation Panel    Frequency of Communication with Friends and Family: More than three times a week    Frequency of Social Gatherings with Friends and Family: Once a week    Attends Religious Services: More than 4 times per year    Active Member of [employer]  or Organizations: Yes    Attends Banker Meetings: More than 4 times per year    Marital Status: Married     Review of Systems  Constitutional:  Negative for appetite change and unexpected weight change.  HENT:  Negative for congestion, sinus pressure and sore throat.   Eyes:  Negative for pain and visual disturbance.  Respiratory:  Negative for cough, chest tightness and shortness of breath.   Cardiovascular:  Negative for chest pain, palpitations and leg swelling.  Gastrointestinal:  Negative for abdominal pain, diarrhea, nausea and vomiting.       GI symptoms have improved.   Genitourinary:  Negative for difficulty urinating and dysuria.  Musculoskeletal:  Negative for joint swelling and myalgias.  Skin:  Negative for color  change and rash.  Neurological:  Negative for dizziness and headaches.  Hematological:  Negative for adenopathy. Does not bruise/bleed easily.  Psychiatric/Behavioral:  Negative for agitation and dysphoric mood.        Objective:     BP 136/82 (BP Location: Left Arm, Patient Position: Sitting, Cuff Size: Normal)   Pulse 91   Temp 98.9 F (37.2 C) (Oral)   Ht 5' 6 (1.676 m)   Wt 158 lb 6.4 oz (71.8 kg)   LMP 11/02/2010   SpO2 99%   BMI 25.57 kg/m  Wt Readings from Last 3 Encounters:  07/20/24 158 lb 6.4 oz (71.8 kg)  02/28/24 159 lb 6.4 oz (72.3 kg)  01/17/24 160 lb 6.4 oz (72.8 kg)    Physical Exam Vitals reviewed.  Constitutional:      General: She is not in acute distress.    Appearance: Normal appearance. She is well-developed.  HENT:     Head: Normocephalic and atraumatic.     Right Ear: External ear normal.     Left Ear: External ear normal.     Mouth/Throat:     Pharynx: No oropharyngeal exudate or posterior oropharyngeal erythema.  Eyes:     General: No scleral icterus.       Right eye: No discharge.        Left eye: No discharge.     Conjunctiva/sclera: Conjunctivae normal.  Neck:     Thyroid : No thyromegaly.  Cardiovascular:     Rate and Rhythm: Normal rate and regular rhythm.  Pulmonary:     Effort: No tachypnea, accessory muscle usage or respiratory distress.     Breath sounds: Normal breath sounds. No decreased breath sounds or wheezing.  Chest:  Breasts:    Right: No inverted nipple, mass, nipple discharge or tenderness (no axillary adenopathy).     Left: No inverted nipple, mass, nipple discharge or tenderness (no axilarry adenopathy).  Abdominal:     General: Bowel sounds are normal.     Palpations: Abdomen is soft.     Tenderness: There is no abdominal tenderness.  Musculoskeletal:        General: No swelling or tenderness.     Cervical back: Neck supple.  Lymphadenopathy:     Cervical: No cervical adenopathy.  Skin:    Findings: No  erythema or rash.  Neurological:     Mental Status: She is alert and oriented to person, place, and time.  Psychiatric:        Mood and Affect: Mood normal.        Behavior: Behavior normal.         Outpatient Encounter Medications as of 07/20/2024  Medication Sig   cholecalciferol (VITAMIN D3) 25 MCG (1000 UNIT)  tablet Take 1,000 Units by mouth daily.   cyanocobalamin  (VITAMIN B12) 1000 MCG tablet Take 1,000 mcg by mouth daily.   famotidine (PEPCID) 20 MG tablet Take by mouth as needed.   mupirocin  ointment (BACTROBAN ) 2 % Apply 1 Application topically 2 (two) times daily.   rosuvastatin  (CRESTOR ) 5 MG tablet Take 1 tablet (5 mg total) by mouth daily.   SKYRIZI  180 MG/1.2ML SOCT Inject 180 mg into the skin every 8 (eight) weeks.   [DISCONTINUED] ALPRAZolam  (XANAX ) 0.25 MG tablet TAKE ONE TABLET BY MOUTH ONCE DAILY AS NEEDED   [DISCONTINUED] estradiol  (ESTRACE ) 0.1 MG/GM vaginal cream As directed.   ALPRAZolam  (XANAX ) 0.25 MG tablet TAKE ONE TABLET BY MOUTH ONCE DAILY AS NEEDED   estradiol  (ESTRACE ) 0.1 MG/GM vaginal cream As directed.   [DISCONTINUED] cyanocobalamin  (VITAMIN B12) 1000 MCG/ML injection Inject 1,000 mcg into the muscle every 30 (thirty) days.   [DISCONTINUED] nitrofurantoin , macrocrystal-monohydrate, (MACROBID ) 100 MG capsule Take 1 capsule (100 mg total) by mouth 2 (two) times daily. (Patient not taking: Reported on 07/20/2024)   No facility-administered encounter medications on file as of 07/20/2024.     Lab Results  Component Value Date   WBC 6.3 01/15/2024   HGB 12.6 01/15/2024   HCT 38.1 01/15/2024   PLT 220.0 01/15/2024   GLUCOSE 92 07/16/2024   CHOL 194 07/16/2024   TRIG (H) 07/16/2024    538.0 Triglyceride is over 400; calculations on Lipids are invalid.   HDL 34.40 (L) 07/16/2024   LDLDIRECT 71.0 07/16/2024   LDLCALC 94 01/15/2024   ALT 15 07/16/2024   AST 17 07/16/2024   NA 138 07/16/2024   K 4.6 07/16/2024   CL 102 07/16/2024   CREATININE 0.86  07/16/2024   BUN 14 07/16/2024   CO2 25 07/16/2024   TSH 2.08 07/16/2024    MM 3D DIAGNOSTIC MAMMOGRAM UNILATERAL LEFT BREAST Result Date: 11/04/2023 CLINICAL DATA:  Recalled from screening for left breast mass. EXAM: DIGITAL DIAGNOSTIC UNILATERAL LEFT MAMMOGRAM WITH TOMOSYNTHESIS AND CAD; ULTRASOUND LEFT BREAST LIMITED TECHNIQUE: Left digital diagnostic mammography and breast tomosynthesis was performed. The images were evaluated with computer-aided detection. ; Targeted ultrasound examination of the left breast was performed. COMPARISON:  Previous exam(s). ACR Breast Density Category c: The breasts are heterogeneously dense, which may obscure small masses. FINDINGS: Within the anterior inferior left breast there is a persistent low-density oval mass further evaluated with spot compression views. Targeted ultrasound is performed, showing a 0.8 x 0.3 x 0.7 cm cyst left breast 6 o'clock position 4 cm from the nipple. IMPRESSION: Left breast cyst.  No mammographic evidence for malignancy. RECOMMENDATION: Screening mammogram in one year.(Code:SM-B-01Y) I have discussed the findings and recommendations with the patient. If applicable, a reminder letter will be sent to the patient regarding the next appointment. BI-RADS CATEGORY  2: Benign. Electronically Signed   By: Bard Moats M.D.   On: 11/04/2023 14:09   US  LIMITED ULTRASOUND INCLUDING AXILLA LEFT BREAST  Result Date: 11/04/2023 CLINICAL DATA:  Recalled from screening for left breast mass. EXAM: DIGITAL DIAGNOSTIC UNILATERAL LEFT MAMMOGRAM WITH TOMOSYNTHESIS AND CAD; ULTRASOUND LEFT BREAST LIMITED TECHNIQUE: Left digital diagnostic mammography and breast tomosynthesis was performed. The images were evaluated with computer-aided detection. ; Targeted ultrasound examination of the left breast was performed. COMPARISON:  Previous exam(s). ACR Breast Density Category c: The breasts are heterogeneously dense, which may obscure small masses. FINDINGS: Within  the anterior inferior left breast there is a persistent low-density oval mass further evaluated with spot compression  views. Targeted ultrasound is performed, showing a 0.8 x 0.3 x 0.7 cm cyst left breast 6 o'clock position 4 cm from the nipple. IMPRESSION: Left breast cyst.  No mammographic evidence for malignancy. RECOMMENDATION: Screening mammogram in one year.(Code:SM-B-01Y) I have discussed the findings and recommendations with the patient. If applicable, a reminder letter will be sent to the patient regarding the next appointment. BI-RADS CATEGORY  2: Benign. Electronically Signed   By: Bard Moats M.D.   On: 11/04/2023 14:09       Assessment & Plan:  Routine general medical examination at a health care facility  Health care maintenance Assessment & Plan: Physical today 07/20/24.  Colonoscopy 01/2018 - recommended f/u in 5 years.  F/u colonoscopy 08/2023. Mammogram 10/31/23 -  Birads 0. Follow up left breast mammogram/ultrasound 11/04/23 - Birads II. Recommended annual screening mammogram.    Visit for screening mammogram -     3D Screening Mammogram, Left and Right; Future  Hematuria, unspecified type Assessment & Plan: Rechekc urinalysis today to confirm blood clear.   Orders: -     Urinalysis, Routine w reflex microscopic  Hypercholesterolemia Assessment & Plan: The 10-year ASCVD risk score (Arnett DK, et al., 2019) is: 6.3%   Values used to calculate the score:     Age: 76 years     Clincally relevant sex: Female     Is Non-Hispanic African American: No     Diabetic: No     Tobacco smoker: No     Systolic Blood Pressure: 136 mmHg     Is BP treated: No     HDL Cholesterol: 34.4 mg/dL     Total Cholesterol: 194 mg/dL  Low cholesterol diet and exercise.  Follow  lipid panel.  [facility] lipid diet information. Start low dose crestor  - given significant elevation triglycerides. Will start statin first. Follow lipid panel.   Orders: -     Lipid panel; Future -     Hepatic function  panel; Future -     Basic metabolic panel with GFR; Future -     Hepatic function panel; Future  Stress Assessment & Plan: Overall appears to be handling things relatively well. Discussed elevated triglycerides as outlined. Follow.   Orders: -     ALPRAZolam ; TAKE ONE TABLET BY MOUTH ONCE DAILY AS NEEDED  Dispense: 30 tablet; Refill: 0  Crohn's disease with complication, unspecified gastrointestinal tract location [facility]) Assessment & Plan: Newly diagnosed with ileal involvement. Has started treatment - rizankinumab. GI symptoms have improved overall.  Follow up calprotectin 04/20/24 - 46 (compared to 1100 09/17/23). She is concerned that this medication may be affecting her triglycerides. Plans to d/w GI.    Elbow pain, left Assessment & Plan: Appears to be c/w tendinitis. Elbow strap. Follow.    Other orders -     Rosuvastatin  Calcium ; Take 1 tablet (5 mg total) by mouth daily.  Dispense: 30 tablet; Refill: 2 -     Estradiol ; As directed.  Dispense: 42.5 g; Refill: 1     Allena Hamilton, MD

## 2024-07-21 ENCOUNTER — Ambulatory Visit: Payer: Self-pay | Admitting: Internal Medicine

## 2024-07-26 ENCOUNTER — Encounter: Payer: Self-pay | Admitting: Internal Medicine

## 2024-07-26 DIAGNOSIS — M25522 Pain in left elbow: Secondary | ICD-10-CM | POA: Insufficient documentation

## 2024-07-26 NOTE — Assessment & Plan Note (Signed)
 Newly diagnosed with ileal involvement. Has started treatment - rizankinumab. GI symptoms have improved overall.  Follow up calprotectin 04/20/24 - 46 (compared to 1100 09/17/23). She is concerned that this medication may be affecting her triglycerides. Plans to d/w GI.

## 2024-07-26 NOTE — Assessment & Plan Note (Signed)
 The 10-year ASCVD risk score (Arnett DK, et al., 2019) is: 6.3%   Values used to calculate the score:     Age: 63 years     Clincally relevant sex: Female     Is Non-Hispanic African American: No     Diabetic: No     Tobacco smoker: No     Systolic Blood Pressure: 136 mmHg     Is BP treated: No     HDL Cholesterol: 34.4 mg/dL     Total Cholesterol: 194 mg/dL  Low cholesterol diet and exercise.  Follow  lipid panel.  [facility] lipid diet information. Start low dose crestor  - given significant elevation triglycerides. Will start statin first. Follow lipid panel.

## 2024-07-26 NOTE — Assessment & Plan Note (Signed)
 Rechekc urinalysis today to confirm blood clear.

## 2024-07-26 NOTE — Assessment & Plan Note (Signed)
 Overall appears to be handling things relatively well. Discussed elevated triglycerides as outlined. Follow.

## 2024-07-26 NOTE — Assessment & Plan Note (Signed)
 Appears to be c/w tendinitis. Elbow strap. Follow.

## 2024-08-12 DIAGNOSIS — D1801 Hemangioma of skin and subcutaneous tissue: Secondary | ICD-10-CM | POA: Diagnosis not present

## 2024-08-12 DIAGNOSIS — L538 Other specified erythematous conditions: Secondary | ICD-10-CM | POA: Diagnosis not present

## 2024-08-12 DIAGNOSIS — L820 Inflamed seborrheic keratosis: Secondary | ICD-10-CM | POA: Diagnosis not present

## 2024-08-20 ENCOUNTER — Ambulatory Visit

## 2024-08-31 ENCOUNTER — Other Ambulatory Visit (INDEPENDENT_AMBULATORY_CARE_PROVIDER_SITE_OTHER)

## 2024-08-31 DIAGNOSIS — E7800 Pure hypercholesterolemia, unspecified: Secondary | ICD-10-CM

## 2024-08-31 LAB — HEPATIC FUNCTION PANEL
ALT: 15 U/L (ref 0–35)
AST: 17 U/L (ref 0–37)
Albumin: 4.8 g/dL (ref 3.5–5.2)
Alkaline Phosphatase: 86 U/L (ref 39–117)
Bilirubin, Direct: 0.1 mg/dL (ref 0.0–0.3)
Total Bilirubin: 0.6 mg/dL (ref 0.2–1.2)
Total Protein: 7.8 g/dL (ref 6.0–8.3)

## 2024-09-01 ENCOUNTER — Other Ambulatory Visit

## 2024-09-26 ENCOUNTER — Ambulatory Visit
Admission: EM | Admit: 2024-09-26 | Discharge: 2024-09-26 | Disposition: A | Discharge status: Home / Self Care | Attending: Student | Admitting: Student

## 2024-09-26 ENCOUNTER — Encounter: Payer: Self-pay | Admitting: Emergency Medicine

## 2024-09-26 DIAGNOSIS — J069 Acute upper respiratory infection, unspecified: Secondary | ICD-10-CM | POA: Insufficient documentation

## 2024-09-26 LAB — RESP PANEL BY RT-PCR (FLU A&B, COVID) ARPGX2
Influenza A by PCR: NEGATIVE
Influenza B by PCR: NEGATIVE
SARS Coronavirus 2 by RT PCR: NEGATIVE

## 2024-09-26 MED ORDER — PROMETHAZINE-DM 6.25-15 MG/5ML PO SYRP: 5.0000 mL | ORAL_SOLUTION | Freq: Four times a day (QID) | ORAL | 0 refills | Status: DC | PRN

## 2024-09-26 MED ORDER — BENZONATATE 100 MG PO CAPS: 200.0000 mg | ORAL_CAPSULE | Freq: Three times a day (TID) | ORAL | 0 refills | Status: DC

## 2024-09-26 MED ORDER — IPRATROPIUM BROMIDE 0.06 % NA SOLN: 2.0000 | Freq: Four times a day (QID) | NASAL | 12 refills | Status: DC

## 2024-09-26 NOTE — Discharge Instructions (Addendum)
 Your testing today was negative for COVID or influenza.  Your exam is consistent with a viral respiratory infection.  Please use over-the-counter Tylenol and or ibuprofen according to the package instructions as needed for any pain or if you develop fever.  Use the Atrovent nasal spray, 2 squirts in each nostril every 6 hours, as needed for runny nose and postnasal drip.  Use the Tessalon  Perles every 8 hours during the day.  Take them with a small sip of water.  They may give you some numbness to the base of your tongue or a metallic taste in your mouth, this is normal.  Use the Promethazine DM cough syrup at bedtime for cough and congestion.  It will make you drowsy so do not take it during the day.  Return for reevaluation or see your primary care provider for any new or worsening symptoms.

## 2024-09-26 NOTE — ED Provider Notes (Signed)
 [facility]    CSN: [id number] Arrival date & time: 09/26/24  9063      History   Chief Complaint Chief Complaint  Patient presents with   Cough    HPI Kelly Tanner is a 63 y.o. female.   HPI  63 year old female with past medical history significant for hyperlipidemia, basal cell carcinoma, and vitamin D  deficiency presents for evaluation of 2 days with the respiratory symptoms.  She reports that her symptoms began after her husband contracted a cold.  He took a home COVID test that was negative, as did the patient.  She is reporting significant nasal congestion to the point of being unable to breathe through her nose, itching in her ears, sore throat, and a nonproductive cough.  No shortness of breath or wheezing.  Past Medical History:  Diagnosis Date   Allergy    septra   Basal cell carcinoma    Hyperlipidemia     Patient Active Problem List   Diagnosis Date Noted   Elbow pain, left 07/26/2024   Dysuria 02/28/2024   Vitamin D  deficiency 10/17/2023   Nasal lesion 10/01/2023   Crohn's disease (HCC) 10/01/2023   B12 deficiency 07/16/2023   Low back pain 07/15/2023   Joint pain 07/08/2022   Tachycardia 07/08/2022   Cough 02/26/2021   Urethral irritation 01/24/2021   Nasal congestion 12/25/2020   Postmenopausal vaginal bleeding 12/10/2020   RUQ pain 12/20/2019   Leukocytosis 12/20/2019   Right shoulder pain 12/20/2019   Sleep difficulties 09/07/2019   Sore throat 09/09/2017   GERD (gastroesophageal reflux disease) 05/16/2017   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   Incomplete emptying of bladder 01/21/2016   Diarrhea 09/18/2015   Health care maintenance 04/17/2015   Stress incontinence (female) (female) 12/23/2014   Elevated blood pressure reading 08/23/2014   Elevated blood-pressure reading without diagnosis of hypertension 08/23/2014   Stress 04/06/2014   Acute stress reaction 04/06/2014   Anemia 10/11/2013    Microhematuria 05/25/2013   Hematuria 04/23/2013   Hypercholesterolemia 01/04/2013   Skin cancer 01/04/2013   Family history of colon cancer 01/04/2013   Interstitial cystitis 01/04/2013   Family history of malignant neoplasm of gastrointestinal tract 01/04/2013    Past Surgical History:  Procedure Laterality Date   CYST REMOVAL NECK     EYE SURGERY  05/2017   REFRACTIVE SURGERY     SKIN CANCER EXCISION  12/11/1999   basal cell    OB History   No obstetric history on file.      Home Medications    Prior to Admission medications   Medication Sig Start Date End Date Taking? Authorizing Provider  benzonatate  (TESSALON ) 100 MG capsule Take 2 capsules (200 mg total) by mouth every 8 (eight) hours. 09/26/24  Yes Bernardino Ditch, NP  cholecalciferol (VITAMIN D3) 25 MCG (1000 UNIT) tablet Take 1,000 Units by mouth daily.   Yes [provider]  cyanocobalamin  (VITAMIN B12) 1000 MCG tablet Take 1,000 mcg by mouth daily.   Yes [provider]  estradiol  (ESTRACE ) 0.1 MG/GM vaginal cream As directed. 07/20/24  Yes Glendia Shad, MD  ipratropium (ATROVENT) 0.06 % nasal spray Place 2 sprays into both nostrils 4 (four) times daily. 09/26/24  Yes Bernardino Ditch, NP  promethazine-dextromethorphan (PROMETHAZINE-DM) 6.25-15 MG/5ML syrup Take 5 mLs by mouth 4 (four) times daily as needed. 09/26/24  Yes Bernardino Ditch, NP  rosuvastatin  (CRESTOR ) 5 MG tablet Take 1 tablet (5 mg total) by mouth daily. 07/20/24  Yes Scott,  Charlene, MD  SKYRIZI  180 MG/1.2ML SOCT Inject 180 mg into the skin every 8 (eight) weeks. 02/26/24  Yes [provider]  ALPRAZolam  (XANAX ) 0.25 MG tablet TAKE ONE TABLET BY MOUTH ONCE DAILY AS NEEDED 07/20/24   Glendia Shad, MD  famotidine (PEPCID) 20 MG tablet Take by mouth as needed.    [provider]    Family History Family History  Problem Relation Age of Onset   Cancer Mother    Cancer Father    COPD Father    Breast cancer Paternal  Grandmother    Lung cancer Other        parent   Colon cancer Other        parent    Social History Social History   Tobacco Use   Smoking status: Never   Smokeless tobacco: Never  Vaping Use   Vaping status: Never Used  Substance Use Topics   Alcohol use: Yes   Drug use: No     Allergies   Sulfa antibiotics   Review of Systems Review of Systems  Constitutional:  Negative for fever.  HENT:  Positive for congestion, ear pain, rhinorrhea and sore throat.   Respiratory:  Positive for cough. Negative for shortness of breath and wheezing.      Physical Exam Triage Vital Signs ED Triage Vitals  Encounter Vitals Group     BP      Girls Systolic BP Percentile      Girls Diastolic BP Percentile      Boys Systolic BP Percentile      Boys Diastolic BP Percentile      Pulse      Resp      Temp      Temp src      SpO2      Weight      Height      Head Circumference      Peak Flow      Pain Score      Pain Loc      Pain Education      Exclude from Growth Chart    No data found.  Updated Vital Signs BP (!) 144/79 (BP Location: Right Arm)   Pulse (!) 103   Temp 99.8 F (37.7 C) (Temporal) Comment: Patient had drank coffee prior to taking oral temperature  Resp 15   Ht 5' 6 (1.676 m)   Wt 158 lb 4.6 oz (71.8 kg)   LMP 11/02/2010   SpO2 100%   BMI 25.55 kg/m   Visual Acuity Right Eye Distance:   Left Eye Distance:   Bilateral Distance:    Right Eye Near:   Left Eye Near:    Bilateral Near:     Physical Exam Vitals and nursing note reviewed.  Constitutional:      Appearance: Normal appearance. She is not ill-appearing.  HENT:     Head: Normocephalic and atraumatic.     Right Ear: Tympanic membrane, ear canal and external ear normal. There is no impacted cerumen.     Left Ear: Tympanic membrane, ear canal and external ear normal. There is no impacted cerumen.     Ears:     Comments: Small serous effusions present behind both TMs but the TMs are  pearly gray in appearance.  Both EACs are clear.    Nose: Congestion and rhinorrhea present.     Comments: H mucosa is erythematous and edematous with clear discharge in both nares.    Mouth/Throat:  Mouth: Mucous membranes are moist.     Pharynx: Oropharynx is clear. Posterior oropharyngeal erythema present. No oropharyngeal exudate.     Comments: Erythema to the posterior oropharynx with clear postnasal drip. Cardiovascular:     Rate and Rhythm: Normal rate and regular rhythm.     Pulses: Normal pulses.     Heart sounds: Normal heart sounds. No murmur heard.    No friction rub. No gallop.  Pulmonary:     Effort: Pulmonary effort is normal.     Breath sounds: Normal breath sounds. No wheezing, rhonchi or rales.  Musculoskeletal:     Cervical back: Normal range of motion and neck supple. No tenderness.  Lymphadenopathy:     Cervical: No cervical adenopathy.  Skin:    General: Skin is warm and dry.     Capillary Refill: Capillary refill takes less than 2 seconds.     Findings: No rash.  Neurological:     General: No focal deficit present.     Mental Status: She is alert and oriented to person, place, and time.      UC Treatments / Results  Labs (all labs ordered are listed, but only abnormal results are displayed) Labs Reviewed  RESP PANEL BY RT-PCR (FLU A&B, COVID) [id number]    EKG   Radiology No results found.  Procedures Procedures (including critical care time)  Medications Ordered in UC Medications - No data to display  Initial Impression / Assessment and Plan / UC Course  I have reviewed the triage vital signs and the nursing notes.  Pertinent labs & imaging results that were available during my care of the patient were reviewed by me and considered in my medical decision making (see chart for details).   Patient is a nontoxic-appearing 63 year old female presenting for evaluation 2 days with respiratory symptoms as outlined in HPI above.  She reports that  her husband came down with respiratory symptoms first and that he tested negative at home for COVID.  She has also taken a home COVID test that was negative.  I did advise the patient that there is a 40% chance that a negative home COVID test could possibly be a false negative.  I will obtain a COVID and flu PCR.  Respiratory panel is negative for COVID and influenza.  I will discharge patient home with diagnosis of viral URI with cough.  I was prescribed Atrovent nasal spray for nasal congestion along with Tessalon  Perles and Promethazine DM cough syrup for cough and congestion.  Return precautions reviewed.   Final Clinical Impressions(s) / UC Diagnoses   Final diagnoses:  Viral URI with cough     Discharge Instructions      Your testing today was negative for COVID or influenza.  Your exam is consistent with a viral respiratory infection.  Please use over-the-counter Tylenol and or ibuprofen according to the package instructions as needed for any pain or if you develop fever.  Use the Atrovent nasal spray, 2 squirts in each nostril every 6 hours, as needed for runny nose and postnasal drip.  Use the Tessalon  Perles every 8 hours during the day.  Take them with a small sip of water.  They may give you some numbness to the base of your tongue or a metallic taste in your mouth, this is normal.  Use the Promethazine DM cough syrup at bedtime for cough and congestion.  It will make you drowsy so do not take it during the day.  Return for reevaluation  or see your primary care provider for any new or worsening symptoms.      ED Prescriptions     Medication Sig Dispense Auth. Provider   benzonatate  (TESSALON ) 100 MG capsule Take 2 capsules (200 mg total) by mouth every 8 (eight) hours. 21 capsule Bernardino Ditch, NP   ipratropium (ATROVENT) 0.06 % nasal spray Place 2 sprays into both nostrils 4 (four) times daily. 15 mL Bernardino Ditch, NP   promethazine-dextromethorphan (PROMETHAZINE-DM)  6.25-15 MG/5ML syrup Take 5 mLs by mouth 4 (four) times daily as needed. 118 mL Bernardino Ditch, NP      PDMP not reviewed this encounter.   Bernardino Ditch, NP 09/26/24 1101

## 2024-09-26 NOTE — ED Triage Notes (Signed)
 Patient reports cough and chest congestion for the past 2 days.  Patient unsure of fevers.  Patient states that she is feeling worse not better.

## 2024-09-29 ENCOUNTER — Encounter: Payer: Self-pay | Admitting: Internal Medicine

## 2024-09-30 ENCOUNTER — Ambulatory Visit: Payer: Self-pay

## 2024-09-30 ENCOUNTER — Ambulatory Visit: Admitting: Family Medicine

## 2024-09-30 DIAGNOSIS — K5000 Crohn's disease of small intestine without complications: Secondary | ICD-10-CM | POA: Diagnosis not present

## 2024-09-30 NOTE — Telephone Encounter (Signed)
 Patient is scheduled virtually with provider tomorrow per provider's request. Patient notified.

## 2024-09-30 NOTE — Telephone Encounter (Signed)
  FYI Only or Action Required?: Action required by provider: request for appointment.  Patient was last seen in primary care on 07/20/2024 by Glendia Shad, MD.  Called Nurse Triage reporting Facial Pain.  Symptoms began several days ago.  Interventions attempted: Prescription medications:  SABRA  Symptoms are: unchanged. Pt. Seen in UC Saturday, now has sinus pain. Asking for antibiotic to be called in. Appointment made today at [facility]. Pt. Would rather not go to that practice.  Triage Disposition: See Physician Within 24 Hours  Patient/caregiver understands and will follow disposition?: Yes    Copied from CRM #[id number]. Topic: Clinical - Red Word Triage >> Sep 30, 2024  7:41 AM Robinson H wrote: [facility] that prompted transfer to Nurse Triage: Pain and pressure in sinus area, discolored mucus orange/gold color, and pain in face Reason for Disposition  [1] Fever returns after gone for over 24 hours AND [2] symptoms worse or not improved  Answer Assessment - Initial Assessment Questions 1. LOCATION: Where does it hurt?      Face, teeth 2. ONSET: When did the sinus pain start?  (e.g., hours, days)      Saturday 3. SEVERITY: How bad is the pain?   (Scale 0-10; or none, mild, moderate or severe)     moderate 4. RECURRENT SYMPTOM: Have you ever had sinus problems before? If Yes, ask: When was the last time? and What happened that time?      yes 5. NASAL CONGESTION: Is the nose blocked? If Yes, ask: Can you open it or must you breathe through your mouth?     no 6. NASAL DISCHARGE: Do you have discharge from your nose? If so ask, What color?     Gold color 7. FEVER: Do you have a fever? If Yes, ask: What is it, how was it measured, and when did it start?      no 8. OTHER SYMPTOMS: Do you have any other symptoms? (e.g., sore throat, cough, earache, difficulty breathing)     cough 9. PREGNANCY: Is there any chance you are pregnant? When was your  last menstrual period?     no  Protocols used: Sinus Pain or Congestion-A-AH

## 2024-09-30 NOTE — Telephone Encounter (Signed)
 Per other note, she was scheduled for evaluation tomorrow with me.

## 2024-09-30 NOTE — Progress Notes (Deleted)
      ACUTE VISIT   Patient: Kelly Tanner   DOB: 1961-03-15   63 y.o. Female  MRN: [id number]   PCP: Glendia Shad, MD  No chief complaint on file.  Subjective    HPI   Discussed the use of AI scribe software for clinical note transcription with the patient, who gave verbal consent to proceed.  History of Present Illness      Medications: Outpatient Medications Prior to Visit  Medication Sig   ALPRAZolam  (XANAX ) 0.25 MG tablet TAKE ONE TABLET BY MOUTH ONCE DAILY AS NEEDED   benzonatate  (TESSALON ) 100 MG capsule Take 2 capsules (200 mg total) by mouth every 8 (eight) hours.   cholecalciferol (VITAMIN D3) 25 MCG (1000 UNIT) tablet Take 1,000 Units by mouth daily.   cyanocobalamin  (VITAMIN B12) 1000 MCG tablet Take 1,000 mcg by mouth daily.   estradiol  (ESTRACE ) 0.1 MG/GM vaginal cream As directed.   famotidine (PEPCID) 20 MG tablet Take by mouth as needed.   ipratropium (ATROVENT) 0.06 % nasal spray Place 2 sprays into both nostrils 4 (four) times daily.   promethazine-dextromethorphan (PROMETHAZINE-DM) 6.25-15 MG/5ML syrup Take 5 mLs by mouth 4 (four) times daily as needed.   rosuvastatin  (CRESTOR ) 5 MG tablet Take 1 tablet (5 mg total) by mouth daily.   SKYRIZI  180 MG/1.2ML SOCT Inject 180 mg into the skin every 8 (eight) weeks.   No facility-administered medications prior to visit.    {Insert previous labs (optional):23779} {See past labs  Heme  Chem  Endocrine  Serology  Results Review (optional):1}   Objective    LMP 11/02/2010  {Insert last BP/Wt (optional):23777}{See vitals history (optional):1}  Physical Exam   Physical Exam    No results found for any visits on 09/30/24.  Assessment & Plan     Assessment and Plan Assessment & Plan       No follow-ups on file.        Rockie Agent, MD  [facility] [phone] (phone) [phone] (fax)  [facility]

## 2024-09-30 NOTE — Telephone Encounter (Signed)
 Please call and see if she wants to schedule a virtual visit Thursday 4:00 to discuss and see what other treatment warranted.

## 2024-09-30 NOTE — Telephone Encounter (Signed)
 Please see other message. I had sent message to see if wanted to be worked in tomorrow (10/01/24) at 4:00 - see other message. Virtual visit

## 2024-10-01 ENCOUNTER — Encounter: Payer: Self-pay | Admitting: Internal Medicine

## 2024-10-01 ENCOUNTER — Ambulatory Visit: Admitting: Nurse Practitioner

## 2024-10-01 ENCOUNTER — Telehealth: Admitting: Internal Medicine

## 2024-10-01 VITALS — BP 135/73 | HR 82

## 2024-10-01 DIAGNOSIS — R0981 Nasal congestion: Secondary | ICD-10-CM | POA: Diagnosis not present

## 2024-10-01 MED ORDER — AZITHROMYCIN 250 MG PO TABS: ORAL_TABLET | ORAL | 0 refills | Status: AC

## 2024-10-01 NOTE — Progress Notes (Signed)
 Patient ID: Kelly Tanner, female   DOB: 1961/04/29, 63 y.o.   MRN: [id number]   Virtual Visit via video Note  I connected with Braiden Hirschhorn by a video enabled telemedicine application and verified that I am speaking with the correct person using two identifiers. Location patient: home Location provider: work  Persons participating in the virtual visit: patient, provider  The limitations, risks, security and privacy concerns of performing an evaluation and management service by video and the availability of in person appointments have been discussed. It has also been discussed with the patient that there may be a patient responsible charge related to this service. The patient expressed understanding and agreed to proceed.   Reason for visit: work in appt.   HPI: Work in with concerns regarding a possible sinus infection. Symptoms started 09/26/24. Evaluated UC 09/26/24 - nasal congestion, sore throat and cough. Covid and flu - negative. Reports that her husband had a cold - starting a couple of weeks ago. Last week - she developed sore throat and congestion. Over the last week, cough increased. Home covid test negative. Increased nasal congestion. Sinus pressure. No chest congestion. No sore throat. Increased post nasal drainage. Temperature 99-100. No sob. No vomiting. Has tried tessalon  perles, cough syrup and ipratropium nasal spray.    ROS: See pertinent positives and negatives per HPI.  Past Medical History:  Diagnosis Date   Allergy    septra   Basal cell carcinoma    Hyperlipidemia     Past Surgical History:  Procedure Laterality Date   CYST REMOVAL NECK     EYE SURGERY  05/2017   REFRACTIVE SURGERY     SKIN CANCER EXCISION  12/11/1999   basal cell    Family History  Problem Relation Age of Onset   Cancer Mother    Cancer Father    COPD Father    Breast cancer Paternal Grandmother    Lung cancer Other        parent   Colon cancer Other        parent    SOCIAL  HX: reviewed.    Current Outpatient Medications:    ALPRAZolam  (XANAX ) 0.25 MG tablet, TAKE ONE TABLET BY MOUTH ONCE DAILY AS NEEDED, Disp: 30 tablet, Rfl: 0   azithromycin  (ZITHROMAX ) 250 MG tablet, Take 2 tablets on day 1, then 1 tablet daily on days 2 through 5, Disp: 6 tablet, Rfl: 0   benzonatate  (TESSALON ) 100 MG capsule, Take 2 capsules (200 mg total) by mouth every 8 (eight) hours., Disp: 21 capsule, Rfl: 0   cholecalciferol (VITAMIN D3) 25 MCG (1000 UNIT) tablet, Take 1,000 Units by mouth daily., Disp: , Rfl:    cyanocobalamin  (VITAMIN B12) 1000 MCG tablet, Take 1,000 mcg by mouth daily., Disp: , Rfl:    estradiol  (ESTRACE ) 0.1 MG/GM vaginal cream, As directed., Disp: 42.5 g, Rfl: 1   famotidine (PEPCID) 20 MG tablet, Take by mouth as needed., Disp: , Rfl:    ipratropium (ATROVENT) 0.06 % nasal spray, Place 2 sprays into both nostrils 4 (four) times daily., Disp: 15 mL, Rfl: 12   promethazine-dextromethorphan (PROMETHAZINE-DM) 6.25-15 MG/5ML syrup, Take 5 mLs by mouth 4 (four) times daily as needed., Disp: 118 mL, Rfl: 0   rosuvastatin  (CRESTOR ) 5 MG tablet, Take 1 tablet (5 mg total) by mouth daily., Disp: 30 tablet, Rfl: 2   SKYRIZI  180 MG/1.2ML SOCT, Inject 180 mg into the skin every 8 (eight) weeks., Disp: , Rfl:   EXAM:  GENERAL:  alert, oriented, appears well and in no acute distress  HEENT: atraumatic, conjunttiva clear, no obvious abnormalities on inspection of external nose and ears  NECK: normal movements of the head and neck  LUNGS: on inspection no signs of respiratory distress, breathing rate appears normal, no obvious gross SOB, gasping or wheezing  CV: no obvious cyanosis  PSYCH/NEURO: pleasant and cooperative, no obvious depression or anxiety, speech and thought processing grossly intact  ASSESSMENT AND PLAN:  Discussed the following assessment and plan:  Problem List Items Addressed This Visit     Nasal congestion - Primary   Increased nasal congestion  and drainage as outlined. Has tried atrovent nasal spray, cough syrup and tessalon  perles. Symptoms persistent. Saline nasal spray. Mucinex or robitussin as needed. Zpak as directed. Discussed the need to take a probiotic (or eat yogurt) daily. Follow. Call with update.        Return if symptoms worsen or fail to improve.   I discussed the assessment and treatment plan with the patient. The patient was provided an opportunity to ask questions and all were answered. The patient agreed with the plan and demonstrated an understanding of the instructions.   The patient was advised to call back or seek an in-person evaluation if the symptoms worsen or if the condition fails to improve as anticipated.    Allena Hamilton, MD

## 2024-10-03 ENCOUNTER — Other Ambulatory Visit: Payer: Self-pay | Admitting: Internal Medicine

## 2024-10-04 ENCOUNTER — Encounter: Payer: Self-pay | Admitting: Internal Medicine

## 2024-10-04 NOTE — Assessment & Plan Note (Signed)
 Increased nasal congestion and drainage as outlined. Has tried atrovent nasal spray, cough syrup and tessalon  perles. Symptoms persistent. Saline nasal spray. Mucinex or robitussin as needed. Zpak as directed. Discussed the need to take a probiotic (or eat yogurt) daily. Follow. Call with update.

## 2024-10-14 ENCOUNTER — Encounter: Payer: Self-pay | Admitting: Internal Medicine

## 2024-10-14 DIAGNOSIS — E7800 Pure hypercholesterolemia, unspecified: Secondary | ICD-10-CM

## 2024-10-14 DIAGNOSIS — Z79899 Other long term (current) drug therapy: Secondary | ICD-10-CM | POA: Insufficient documentation

## 2024-10-14 DIAGNOSIS — K50919 Crohn's disease, unspecified, with unspecified complications: Secondary | ICD-10-CM

## 2024-10-14 NOTE — Telephone Encounter (Signed)
Orders placed for labs.  Pt notified via my chart.

## 2024-10-23 ENCOUNTER — Other Ambulatory Visit (INDEPENDENT_AMBULATORY_CARE_PROVIDER_SITE_OTHER)

## 2024-10-23 DIAGNOSIS — K50919 Crohn's disease, unspecified, with unspecified complications: Secondary | ICD-10-CM | POA: Diagnosis not present

## 2024-10-23 DIAGNOSIS — E7800 Pure hypercholesterolemia, unspecified: Secondary | ICD-10-CM | POA: Diagnosis not present

## 2024-10-23 DIAGNOSIS — Z79899 Other long term (current) drug therapy: Secondary | ICD-10-CM

## 2024-10-23 NOTE — Addendum Note (Signed)
 Addended by: MARYLEN PRO A on: 10/23/2024 08:28 AM   Modules accepted: Orders

## 2024-10-26 ENCOUNTER — Other Ambulatory Visit

## 2024-10-26 LAB — QUANTIFERON-TB GOLD PLUS
Mitogen-NIL: 8.21 [IU]/mL
NIL: 0.02 [IU]/mL
QuantiFERON-TB Gold Plus: NEGATIVE
TB1-NIL: <0 [IU]/mL
TB2-NIL: <0 [IU]/mL

## 2024-10-26 LAB — BASIC METABOLIC PANEL WITH GFR
BUN: 15 mg/dL (ref 7–25)
CO2: 28 mmol/L (ref 20–32)
Calcium: 9.9 mg/dL (ref 8.6–10.4)
Chloride: 103 mmol/L (ref 98–110)
Creat: 0.76 mg/dL (ref 0.50–1.05)
Glucose, Bld: 95 mg/dL (ref 65–99)
Potassium: 4.3 mmol/L (ref 3.5–5.3)
Sodium: 139 mmol/L (ref 135–146)
eGFR: 88 mL/min/1.73m2 (ref >=60)

## 2024-10-26 LAB — CBC WITH DIFFERENTIAL/PLATELET
Absolute Lymphocytes: 1430 {cells}/uL (ref 850–3900)
Absolute Monocytes: 270 {cells}/uL (ref 200–950)
Basophils Absolute: 30 {cells}/uL (ref 0–200)
Basophils Relative: 0.6 %
Eosinophils Absolute: 110 {cells}/uL (ref 15–500)
Eosinophils Relative: 2.2 %
HCT: 39 % (ref 35.0–45.0)
Hemoglobin: 12.9 g/dL (ref 11.7–15.5)
MCH: 29.7 pg (ref 27.0–33.0)
MCHC: 33.1 g/dL (ref 32.0–36.0)
MCV: 89.7 fL (ref 80.0–100.0)
MPV: 11.3 fL (ref 7.5–12.5)
Monocytes Relative: 5.4 %
Neutro Abs: 3160 {cells}/uL (ref 1500–7800)
Neutrophils Relative %: 63.2 %
Platelets: 181 Thousand/uL (ref 140–400)
RBC: 4.35 Million/uL (ref 3.80–5.10)
RDW: 13.7 % (ref 11.0–15.0)
Total Lymphocyte: 28.6 %
WBC: 5 Thousand/uL (ref 3.8–10.8)

## 2024-10-26 LAB — LIPID PANEL
Cholesterol: 158 mg/dL (ref <200)
HDL: 45 mg/dL — ABNORMAL LOW (ref >=50)
LDL Cholesterol (Calc): 83 mg/dL
Non-HDL Cholesterol (Calc): 113 mg/dL (ref <130)
Total CHOL/HDL Ratio: 3.5 (calc) (ref <5.0)
Triglycerides: 204 mg/dL — ABNORMAL HIGH (ref <150)

## 2024-10-26 LAB — HEPATIC FUNCTION PANEL
AG Ratio: 1.5 (calc) (ref 1.0–2.5)
ALT: 15 U/L (ref 6–29)
AST: 19 U/L (ref 10–35)
Albumin: 4.8 g/dL (ref 3.6–5.1)
Alkaline phosphatase (APISO): 83 U/L (ref 37–153)
Bilirubin, Direct: 0.1 mg/dL (ref 0.0–0.2)
Globulin: 3.1 g/dL (ref 1.9–3.7)
Indirect Bilirubin: 0.7 mg/dL (ref 0.2–1.2)
Total Bilirubin: 0.8 mg/dL (ref 0.2–1.2)
Total Protein: 7.9 g/dL (ref 6.1–8.1)

## 2024-10-26 LAB — HEPATITIS C ANTIBODY: Hepatitis C Ab: NONREACTIVE

## 2024-10-26 LAB — SEDIMENTATION RATE: Sed Rate: 28 mm/h (ref 0–30)

## 2024-10-26 LAB — HEPATITIS B SURFACE ANTIGEN: Hepatitis B Surface Ag: NONREACTIVE

## 2024-10-26 LAB — HEPATITIS B SURFACE ANTIBODY,QUALITATIVE: Hep B S Ab: NONREACTIVE

## 2024-10-26 LAB — HEPATITIS A ANTIBODY, TOTAL: Hepatitis A AB,Total: REACTIVE — AB

## 2024-10-26 LAB — C-REACTIVE PROTEIN: CRP: <3 mg/L (ref <8.0)

## 2024-10-26 LAB — HEPATITIS B CORE ANTIBODY, TOTAL: Hep B Core Total Ab: NONREACTIVE

## 2024-10-27 NOTE — Telephone Encounter (Signed)
 Called and spoke to Ms Hoogland.

## 2024-10-28 ENCOUNTER — Telehealth: Payer: Self-pay

## 2024-10-28 ENCOUNTER — Ambulatory Visit (INDEPENDENT_AMBULATORY_CARE_PROVIDER_SITE_OTHER): Admitting: Internal Medicine

## 2024-10-28 ENCOUNTER — Ambulatory Visit: Payer: Self-pay | Admitting: Internal Medicine

## 2024-10-28 ENCOUNTER — Encounter: Payer: Self-pay | Admitting: Internal Medicine

## 2024-10-28 VITALS — BP 110/70 | HR 76 | Temp 98.1°F | Ht 66.0 in | Wt 162.2 lb

## 2024-10-28 DIAGNOSIS — E7800 Pure hypercholesterolemia, unspecified: Secondary | ICD-10-CM | POA: Diagnosis not present

## 2024-10-28 DIAGNOSIS — Z23 Encounter for immunization: Secondary | ICD-10-CM | POA: Diagnosis not present

## 2024-10-28 DIAGNOSIS — R319 Hematuria, unspecified: Secondary | ICD-10-CM

## 2024-10-28 DIAGNOSIS — F439 Reaction to severe stress, unspecified: Secondary | ICD-10-CM

## 2024-10-28 DIAGNOSIS — K50919 Crohn's disease, unspecified, with unspecified complications: Secondary | ICD-10-CM

## 2024-10-28 DIAGNOSIS — D649 Anemia, unspecified: Secondary | ICD-10-CM

## 2024-10-28 DIAGNOSIS — E559 Vitamin D deficiency, unspecified: Secondary | ICD-10-CM | POA: Diagnosis not present

## 2024-10-28 MED ORDER — ROSUVASTATIN CALCIUM 5 MG PO TABS: 5.0000 mg | ORAL_TABLET | Freq: Every day | ORAL | 1 refills | Status: AC

## 2024-10-28 NOTE — Progress Notes (Signed)
 Subjective:    Patient ID: Kelly Tanner, female    DOB: Mar 25, 1961, 63 y.o.   MRN: [id number]  Patient here for  Chief Complaint  Patient presents with   Medical Management of Chronic Issues    3 mth f/u    HPI Here for a scheduled follow up - follow up regarding Crohns and hypercholesterolemia. Last visit, discussed elevated triglycerides. Started on crestor . Discussed recent labs. Triglycerides improved - 205. Continue crestor , diet and exercise. Continues f/u with GI - on rizankinumab. Had f/u 09/30/24 - stable. Recommended follow up colonoscopy. GI had recommended f/u labs. Discussed labs and recent hepatitis A ab positive. Questions answered. Labs forwarded to GI. Breathing stable. No chest pain or sob reported. Overall doing better - GI issues.    Past Medical History:  Diagnosis Date   Allergy    septra   Basal cell carcinoma    Hyperlipidemia    Past Surgical History:  Procedure Laterality Date   CYST REMOVAL NECK     EYE SURGERY  05/2017   REFRACTIVE SURGERY     SKIN CANCER EXCISION  12/11/1999   basal cell   Family History  Problem Relation Age of Onset   Cancer Mother    Cancer Father    COPD Father    Breast cancer Paternal Grandmother    Lung cancer Other        parent   Colon cancer Other        parent   Social History   Socioeconomic History   Marital status: Married    Spouse name: Not on file   Number of children: 2   Years of education: Not on file   Highest education level: Bachelor's degree (e.g., BA, AB, BS)  Occupational History   Not on file  Tobacco Use   Smoking status: Never   Smokeless tobacco: Never  Vaping Use   Vaping status: Never Used  Substance and Sexual Activity   Alcohol use: Yes   Drug use: No   Sexual activity: Yes    Birth control/protection: Post-menopausal  Other Topics Concern   Not on file  Social History Narrative   Lives at home in [place].    Married with kids   Social Drivers of Health    Financial Resource Strain: Low Risk  (07/17/2024)   Overall Financial Resource Strain (CARDIA)    Difficulty of Paying Living Expenses: Not hard at all  Food Insecurity: No Food Insecurity (07/17/2024)   Hunger Vital Sign    Worried About Running Out of Food in the Last Year: Never true    Ran Out of Food in the Last Year: Never true  Transportation Needs: No Transportation Needs (07/17/2024)   PRAPARE - Administrator, Civil Service (Medical): No    Lack of Transportation (Non-Medical): No  Physical Activity: Sufficiently Active (07/17/2024)   Exercise Vital Sign    Days of Exercise per Week: 5 days    Minutes of Exercise per Session: 40 min  Stress: Stress Concern Present (07/17/2024)   [employer] of Occupational Health - Occupational Stress Questionnaire    Feeling of Stress: To some extent  Social Connections: Socially Integrated (07/17/2024)   Social Connection and Isolation Panel    Frequency of Communication with Friends and Family: More than three times a week    Frequency of Social Gatherings with Friends and Family: Once a week    Attends Religious Services: More than 4 times per year  Active Member of Clubs or Organizations: Yes    Attends Banker Meetings: More than 4 times per year    Marital Status: Married     Review of Systems  Constitutional:  Negative for appetite change and unexpected weight change.  HENT:  Negative for congestion and sinus pressure.   Respiratory:  Negative for cough, chest tightness and shortness of breath.   Cardiovascular:  Negative for chest pain, palpitations and leg swelling.  Gastrointestinal:  Negative for abdominal pain, diarrhea, nausea and vomiting.  Genitourinary:  Negative for difficulty urinating and dysuria.  Musculoskeletal:  Negative for joint swelling and myalgias.  Skin:  Negative for color change and rash.  Neurological:  Negative for dizziness and headaches.  Psychiatric/Behavioral:  Negative for  agitation and dysphoric mood.        Objective:     BP 110/70   Pulse 76   Temp 98.1 F (36.7 C) (Oral)   Ht 5' 6 (1.676 m)   Wt 162 lb 4 oz (73.6 kg)   LMP 11/02/2010   SpO2 99%   BMI 26.19 kg/m  Wt Readings from Last 3 Encounters:  10/28/24 162 lb 4 oz (73.6 kg)  09/26/24 158 lb 4.6 oz (71.8 kg)  07/20/24 158 lb 6.4 oz (71.8 kg)    Physical Exam Vitals reviewed.  Constitutional:      General: She is not in acute distress.    Appearance: Normal appearance.  HENT:     Head: Normocephalic and atraumatic.     Right Ear: External ear normal.     Left Ear: External ear normal.     Mouth/Throat:     Pharynx: No oropharyngeal exudate or posterior oropharyngeal erythema.  Eyes:     General: No scleral icterus.       Right eye: No discharge.        Left eye: No discharge.     Conjunctiva/sclera: Conjunctivae normal.  Neck:     Thyroid : No thyromegaly.  Cardiovascular:     Rate and Rhythm: Normal rate and regular rhythm.  Pulmonary:     Effort: No respiratory distress.     Breath sounds: Normal breath sounds. No wheezing.  Abdominal:     General: Bowel sounds are normal.     Palpations: Abdomen is soft.     Tenderness: There is no abdominal tenderness.  Musculoskeletal:        General: No swelling or tenderness.     Cervical back: Neck supple. No tenderness.  Lymphadenopathy:     Cervical: No cervical adenopathy.  Skin:    Findings: No erythema or rash.  Neurological:     Mental Status: She is alert.  Psychiatric:        Mood and Affect: Mood normal.        Behavior: Behavior normal.         Outpatient Encounter Medications as of 10/28/2024  Medication Sig   ALPRAZolam  (XANAX ) 0.25 MG tablet TAKE ONE TABLET BY MOUTH ONCE DAILY AS NEEDED   cholecalciferol (VITAMIN D3) 25 MCG (1000 UNIT) tablet Take 1,000 Units by mouth daily.   cyanocobalamin  (VITAMIN B12) 1000 MCG tablet Take 1,000 mcg by mouth daily.   estradiol  (ESTRACE ) 0.1 MG/GM vaginal cream As  directed.   famotidine (PEPCID) 20 MG tablet Take by mouth as needed.   Na Sulfate-K Sulfate-Mg Sulfate concentrate (SUPREP) 17.5-3.13-1.6 GM/177ML SOLN SMARTSIG:Bottle(s) By Mouth As Directed   SKYRIZI  180 MG/1.2ML SOCT Inject 180 mg into the skin every 8 (eight)  weeks.   rosuvastatin  (CRESTOR ) 5 MG tablet Take 1 tablet (5 mg total) by mouth daily.   [DISCONTINUED] benzonatate  (TESSALON ) 100 MG capsule Take 2 capsules (200 mg total) by mouth every 8 (eight) hours. (Patient not taking: Reported on 10/28/2024)   [DISCONTINUED] ipratropium (ATROVENT ) 0.06 % nasal spray Place 2 sprays into both nostrils 4 (four) times daily. (Patient not taking: Reported on 10/28/2024)   [DISCONTINUED] promethazine -dextromethorphan (PROMETHAZINE -DM) 6.25-15 MG/5ML syrup Take 5 mLs by mouth 4 (four) times daily as needed. (Patient not taking: Reported on 10/28/2024)   [DISCONTINUED] rosuvastatin  (CRESTOR ) 5 MG tablet TAKE ONE TABLET BY MOUTH ONCE DAILY   No facility-administered encounter medications on file as of 10/28/2024.     Lab Results  Component Value Date   WBC 5.0 10/23/2024   HGB 12.9 10/23/2024   HCT 39.0 10/23/2024   PLT 181 10/23/2024   GLUCOSE 95 10/23/2024   CHOL 158 10/23/2024   TRIG 204 (H) 10/23/2024   HDL 45 (L) 10/23/2024   LDLDIRECT 71.0 07/16/2024   LDLCALC 83 10/23/2024   ALT 15 10/23/2024   AST 19 10/23/2024   NA 139 10/23/2024   K 4.3 10/23/2024   CL 103 10/23/2024   CREATININE 0.76 10/23/2024   BUN 15 10/23/2024   CO2 28 10/23/2024   TSH 2.08 07/16/2024       Assessment & Plan:  Hypercholesterolemia Assessment & Plan: Continue crestor . Triglycerides improved. Continue diet and exercise. Continue crestor . Follow lipid panel.   Orders: -     Lipid panel; Future  Flu vaccine need -     Flu vaccine trivalent PF, 6mos and older(Flulaval,Afluria,Fluarix,Fluzone)  Crohn's disease with complication, unspecified gastrointestinal tract location [facility]) Assessment & Plan:   Continues f/u with GI - on rizankinumab. Had f/u 09/30/24 - stable. Recommended follow up colonoscopy. GI had recommended f/u labs. Discussed labs and recent hepatitis A ab positive. Questions answered. Labs forwarded to GI.   Orders: -     CBC with Differential/Platelet; Future -     Basic metabolic panel with GFR; Future -     Hepatic function panel; Future  Vitamin D  deficiency Assessment & Plan: Vitamin D  level 07-2024 - wnl. Follow.    Stress Assessment & Plan: Overall appears to be doing relatively well. Some increased stress recently regarding questions about her labs. Questions answered. Overall stable. Follow.    Anemia, unspecified type Assessment & Plan: 10/23/24 - hgb wnl.    Hematuria, unspecified type Assessment & Plan: Urine check 07/2024 - no red blood cells.   Other orders -     Rosuvastatin  Calcium ; Take 1 tablet (5 mg total) by mouth daily.  Dispense: 90 tablet; Refill: 1     Allena Hamilton, MD

## 2024-10-28 NOTE — Telephone Encounter (Signed)
 At check-out today, Dr. Allena Hamilton states in her check-out note that we need to schedule a 11-month follow-up for patient with fasting labs two days before her visit.  I scheduled both of these appointments for patient.  Dr. Hamilton also noted that we need to schedule patient's cpe in August.  I scheduled an appointment for patient's physical on 07/23/2025.  Patient states she usually has labs before her physical, so we went ahead and scheduled a lab appointment for her on 07/21/2025.  The lab orders will need to be entered for both lab visits.

## 2024-10-29 ENCOUNTER — Encounter

## 2024-11-06 ENCOUNTER — Encounter: Payer: Self-pay | Admitting: Internal Medicine

## 2024-11-06 NOTE — Assessment & Plan Note (Signed)
 10/23/24 - hgb wnl.

## 2024-11-06 NOTE — Assessment & Plan Note (Signed)
 Continue crestor . Triglycerides improved. Continue diet and exercise. Continue crestor . Follow lipid panel.

## 2024-11-06 NOTE — Assessment & Plan Note (Signed)
 Urine check 07/2024 - no red blood cells.

## 2024-11-06 NOTE — Assessment & Plan Note (Signed)
 Continues f/u with GI - on rizankinumab. Had f/u 09/30/24 - stable. Recommended follow up colonoscopy. GI had recommended f/u labs. Discussed labs and recent hepatitis A ab positive. Questions answered. Labs forwarded to GI.

## 2024-11-06 NOTE — Assessment & Plan Note (Signed)
 Overall appears to be doing relatively well. Some increased stress recently regarding questions about her labs. Questions answered. Overall stable. Follow.

## 2024-11-06 NOTE — Assessment & Plan Note (Signed)
 Vitamin D  level 07-2024 - wnl. Follow.

## 2024-11-19 ENCOUNTER — Ambulatory Visit

## 2024-11-19 ENCOUNTER — Encounter

## 2024-11-19 DIAGNOSIS — K5090 Crohn's disease, unspecified, without complications: Secondary | ICD-10-CM | POA: Diagnosis not present

## 2024-11-19 DIAGNOSIS — K56699 Other intestinal obstruction unspecified as to partial versus complete obstruction: Secondary | ICD-10-CM | POA: Diagnosis not present

## 2024-11-19 DIAGNOSIS — K5289 Other specified noninfective gastroenteritis and colitis: Secondary | ICD-10-CM | POA: Diagnosis not present

## 2024-11-19 DIAGNOSIS — K6389 Other specified diseases of intestine: Secondary | ICD-10-CM | POA: Diagnosis not present

## 2024-11-19 DIAGNOSIS — K5000 Crohn's disease of small intestine without complications: Secondary | ICD-10-CM | POA: Diagnosis not present

## 2024-12-07 ENCOUNTER — Ambulatory Visit
Admission: RE | Admit: 2024-12-07 | Discharge: 2024-12-07 | Disposition: A | Discharge status: Home / Self Care | Source: Ambulatory Visit | Attending: Internal Medicine | Admitting: Internal Medicine

## 2024-12-07 DIAGNOSIS — Z1231 Encounter for screening mammogram for malignant neoplasm of breast: Secondary | ICD-10-CM | POA: Insufficient documentation

## 2025-03-02 ENCOUNTER — Other Ambulatory Visit

## 2025-03-04 ENCOUNTER — Ambulatory Visit: Admitting: Internal Medicine

## 2025-07-21 ENCOUNTER — Other Ambulatory Visit

## 2025-07-23 ENCOUNTER — Encounter: Admitting: Internal Medicine
